# Patient Record
Sex: Female | Born: 1965 | Race: Black or African American | Hispanic: No | Marital: Married | Smoking: Current every day smoker
Health system: Southern US, Community
[De-identification: ages and names within clinical notes are randomized; demographics above are authoritative.]

## PROBLEM LIST (undated history)

## (undated) DIAGNOSIS — I10 Essential (primary) hypertension: Secondary | ICD-10-CM

## (undated) DIAGNOSIS — E785 Hyperlipidemia, unspecified: Secondary | ICD-10-CM

## (undated) DIAGNOSIS — I428 Other cardiomyopathies: Secondary | ICD-10-CM

## (undated) DIAGNOSIS — I5042 Chronic combined systolic (congestive) and diastolic (congestive) heart failure: Secondary | ICD-10-CM

## (undated) DIAGNOSIS — I493 Ventricular premature depolarization: Secondary | ICD-10-CM

## (undated) HISTORY — DX: Ventricular premature depolarization: I49.3

## (undated) HISTORY — DX: Essential (primary) hypertension: I10

## (undated) HISTORY — PX: CARDIAC CATHETERIZATION: SHX172

## (undated) HISTORY — DX: Other cardiomyopathies: I42.8

## (undated) HISTORY — DX: Chronic combined systolic (congestive) and diastolic (congestive) heart failure: I50.42

---

## 2009-09-23 ENCOUNTER — Emergency Department (HOSPITAL_COMMUNITY): Admission: EM | Admit: 2009-09-23 | Discharge: 2009-09-23 | Payer: Self-pay | Admitting: Emergency Medicine

## 2011-02-04 LAB — BASIC METABOLIC PANEL
Chloride: 104 mEq/L (ref 96–112)
GFR calc Af Amer: >60 mL/min (ref >60)
GFR calc non Af Amer: >60 mL/min (ref >60)
Potassium: 3.5 mEq/L (ref 3.5–5.1)
Sodium: 135 mEq/L (ref 135–145)

## 2011-08-31 ENCOUNTER — Emergency Department (HOSPITAL_COMMUNITY)
Admission: EM | Admit: 2011-08-31 | Discharge: 2011-08-31 | Disposition: A | Discharge status: Home / Self Care | Payer: Managed Care, Other (non HMO) | Attending: Emergency Medicine | Admitting: Emergency Medicine

## 2011-08-31 ENCOUNTER — Encounter: Payer: Self-pay | Admitting: *Deleted

## 2011-08-31 ENCOUNTER — Emergency Department (HOSPITAL_COMMUNITY): Payer: Managed Care, Other (non HMO)

## 2011-08-31 DIAGNOSIS — N83201 Unspecified ovarian cyst, right side: Secondary | ICD-10-CM | POA: Diagnosis present

## 2011-08-31 DIAGNOSIS — D259 Leiomyoma of uterus, unspecified: Secondary | ICD-10-CM | POA: Insufficient documentation

## 2011-08-31 DIAGNOSIS — D649 Anemia, unspecified: Secondary | ICD-10-CM | POA: Diagnosis present

## 2011-08-31 DIAGNOSIS — R1031 Right lower quadrant pain: Secondary | ICD-10-CM | POA: Insufficient documentation

## 2011-08-31 DIAGNOSIS — N949 Unspecified condition associated with female genital organs and menstrual cycle: Secondary | ICD-10-CM | POA: Insufficient documentation

## 2011-08-31 DIAGNOSIS — R10811 Right upper quadrant abdominal tenderness: Secondary | ICD-10-CM | POA: Insufficient documentation

## 2011-08-31 DIAGNOSIS — N83209 Unspecified ovarian cyst, unspecified side: Secondary | ICD-10-CM | POA: Insufficient documentation

## 2011-08-31 LAB — CBC
Hemoglobin: 6.8 g/dL — CL (ref 12.0–15.0)
MCH: 15.4 pg — ABNORMAL LOW (ref 26.0–34.0)
Platelets: 359 10*3/uL (ref 150–400)
RBC: 4.42 MIL/uL (ref 3.87–5.11)

## 2011-08-31 LAB — COMPREHENSIVE METABOLIC PANEL
ALT: 5 U/L (ref 0–35)
AST: 12 U/L (ref 0–37)
Alkaline Phosphatase: 55 U/L (ref 39–117)
CO2: 22 mEq/L (ref 19–32)
Calcium: 9.3 mg/dL (ref 8.4–10.5)
GFR calc non Af Amer: >90 mL/min (ref >90)
Potassium: 3.5 mEq/L (ref 3.5–5.1)
Sodium: 134 mEq/L — ABNORMAL LOW (ref 135–145)
Total Protein: 7.2 g/dL (ref 6.0–8.3)

## 2011-08-31 LAB — WET PREP, GENITAL
WBC, Wet Prep HPF POC: NONE SEEN
Yeast Wet Prep HPF POC: NONE SEEN

## 2011-08-31 LAB — URINALYSIS, ROUTINE W REFLEX MICROSCOPIC
Glucose, UA: NEGATIVE mg/dL
Ketones, ur: NEGATIVE mg/dL
Leukocytes, UA: NEGATIVE
Nitrite: NEGATIVE
Specific Gravity, Urine: 1.015 (ref 1.005–1.030)
pH: 8 (ref 5.0–8.0)

## 2011-08-31 LAB — IRON AND TIBC
Iron: <10 ug/dL — ABNORMAL LOW (ref 42–135)
UIBC: >450 ug/dL — ABNORMAL HIGH (ref 125–400)

## 2011-08-31 LAB — PREGNANCY, URINE: Preg Test, Ur: NEGATIVE

## 2011-08-31 LAB — PREPARE RBC (CROSSMATCH)

## 2011-08-31 LAB — ABO/RH: ABO/RH(D): A POS

## 2011-08-31 MED ORDER — DIPHENHYDRAMINE HCL 25 MG PO CAPS
ORAL_CAPSULE | ORAL | Status: AC
  Administered 2011-08-31: 25 mg
  Filled 2011-08-31: qty 1

## 2011-08-31 MED ORDER — IBUPROFEN 600 MG PO TABS: 600.0000 mg | ORAL_TABLET | Freq: Four times a day (QID) | ORAL | Status: AC | PRN

## 2011-08-31 MED ORDER — SODIUM CHLORIDE 0.9 % IV BOLUS (SEPSIS)
1000.0000 mL | Freq: Once | INTRAVENOUS | Status: AC
  Administered 2011-08-31: 1000 mL via INTRAVENOUS

## 2011-08-31 MED ORDER — HYDROMORPHONE HCL 2 MG PO TABS: 2.0000 mg | ORAL_TABLET | ORAL | Status: AC | PRN

## 2011-08-31 MED ORDER — MORPHINE SULFATE 4 MG/ML IJ SOLN
4.0000 mg | Freq: Once | INTRAMUSCULAR | Status: AC
  Administered 2011-08-31: 4 mg via INTRAVENOUS
  Filled 2011-08-31: qty 1

## 2011-08-31 MED ORDER — HYDROMORPHONE HCL 1 MG/ML IJ SOLN
1.0000 mg | Freq: Once | INTRAMUSCULAR | Status: AC
  Administered 2011-08-31: 1 mg via INTRAVENOUS
  Filled 2011-08-31: qty 1

## 2011-08-31 MED ORDER — ONDANSETRON HCL 4 MG/2ML IJ SOLN
4.0000 mg | Freq: Once | INTRAMUSCULAR | Status: AC
Start: 2011-08-31 — End: 2011-08-31
  Administered 2011-08-31: 4 mg via INTRAVENOUS
  Filled 2011-08-31: qty 2

## 2011-08-31 MED ORDER — METRONIDAZOLE 500 MG PO TABS
2000.0000 mg | ORAL_TABLET | Freq: Once | ORAL | Status: AC
  Administered 2011-08-31: 2000 mg via ORAL
  Filled 2011-08-31: qty 4

## 2011-08-31 MED ORDER — MORPHINE SULFATE 2 MG/ML IJ SOLN
2.0000 mg | Freq: Once | INTRAMUSCULAR | Status: AC
  Administered 2011-08-31: 2 mg via INTRAVENOUS
  Filled 2011-08-31: qty 1

## 2011-08-31 NOTE — ED Notes (Signed)
CRITICAL VALUE ALERT  Critical value received: hgb 6.8  Date of notification:  08/31/2011  Time of notification:  1320  Critical value read back: yes  Nurse who received alert:  Neldon Mc RN  MD notified (1st page): 1324 Dr. Karma Ganja  Time of first page:1324  MD notified (2nd page):  Time of second page:  Responding MD:  Dr. Karma Ganja  Time MD responded: 1324

## 2011-08-31 NOTE — ED Provider Notes (Signed)
Reason for Consult:rlq pain x 24 hrs  Referring Physician: Karma Ganja MD  Lauren Banks is an 45 y.o. female seen in the ED [facility] for rlq pain, with workup by Dr linker.  Pt c longstanding uterine fibroids, not being currently evaluated or followed. Pt works daily at job.  Pt has severe pain which she feels she can  Manage with PO meds, she desires to go home.   Pertinent Gynecological History: Menses: regular every 30 days without intermenstrual spotting Bleeding: x 5 days/ cycyle Contraception:  DES exposure: denies Blood transfusions: none Sexually transmitted diseases: no past history Previous GYN Procedures:   Last mammogram:  Date:  Last pap:  Date:  OB History: G, P   Menstrual History: Menarche age: unknown Patient's last menstrual period was 08/23/2011.    History reviewed. No pertinent past medical history.  History reviewed. No pertinent past surgical history.  History reviewed. No pertinent family history.  Social History:  reports that she has been smoking.  She does not have any smokeless tobacco history on file. She reports that she does not drink alcohol or use illicit drugs.  Allergies: No Known Allergies  Medications: I have reviewed the patient's current medications.  ROS  Pt describes self as hungry, wants to go home, aware that pain may be significant x several days.  Blood pressure 156/79, pulse 76, temperature 97.9 F (36.6 C), temperature source Oral, resp. rate 16, height 5\' 10"  (1.778 m), weight 102.059 kg (225 lb), last menstrual period 08/23/2011, SpO2 100.00%. Physical ExamPhysical Examination: General appearance - oriented to person, place, and time, normal appearing weight, well hydrated and in mild to moderate distress Mental status - alert, oriented to person, place, and time, clearly in capacity of mental facilities Abdomen - tenderness noted in the rlq especially when the enlarges 24wk sized uterus is moved side to side.  Uterus nontender to  direct palpation, no rebound.  BS present.  bowel sounds normal  Extremities - Homan's sign negative bilaterally Skin - normal coloration and turgor, no rashes, no suspicious skin lesions noted  Results for orders placed during the hospital encounter of 08/31/11 (from the past 48 hour(s))  URINALYSIS, ROUTINE W REFLEX MICROSCOPIC     Status: Normal   Collection Time   08/31/11 11:24 AM      Component Value Range Comment   Color, Urine YELLOW  YELLOW     Appearance CLEAR  CLEAR     Specific Gravity, Urine 1.015  1.005 - 1.030     pH 8.0  5.0 - 8.0     Glucose, UA NEGATIVE  NEGATIVE (mg/dL)    Hgb urine dipstick NEGATIVE  NEGATIVE     Bilirubin Urine NEGATIVE  NEGATIVE     Ketones, ur NEGATIVE  NEGATIVE (mg/dL)    Protein, ur NEGATIVE  NEGATIVE (mg/dL)    Urobilinogen, UA 0.2  0.0 - 1.0 (mg/dL)    Nitrite NEGATIVE  NEGATIVE     Leukocytes, UA NEGATIVE  NEGATIVE  MICROSCOPIC NOT DONE ON URINES WITH NEGATIVE PROTEIN, BLOOD, LEUKOCYTES, NITRITE, OR GLUCOSE <1000 mg/dL.  PREGNANCY, URINE     Status: Normal   Collection Time   08/31/11 11:24 AM      Component Value Range Comment   Preg Test, Ur NEGATIVE     CBC     Status: Abnormal   Collection Time   08/31/11 12:14 PM      Component Value Range Comment   WBC 11.2 (*) 4.0 - 10.5 (K/uL)  RBC 4.42  3.87 - 5.11 (MIL/uL)    Hemoglobin 6.8 (*) 12.0 - 15.0 (g/dL)    HCT 16.1 (*) 09.6 - 46.0 (%)    MCV 58.4 (*) 78.0 - 100.0 (fL)    MCH 15.4 (*) 26.0 - 34.0 (pg)    MCHC 26.4 (*) 30.0 - 36.0 (g/dL)    RDW 04.5 (*) 40.9 - 15.5 (%)    Platelets 359  150 - 400 (K/uL)   COMPREHENSIVE METABOLIC PANEL     Status: Abnormal   Collection Time   08/31/11 12:14 PM      Component Value Range Comment   Sodium 134 (*) 135 - 145 (mEq/L)    Potassium 3.5  3.5 - 5.1 (mEq/L)    Chloride 101  96 - 112 (mEq/L)    CO2 22  19 - 32 (mEq/L)    Glucose, Bld 91  70 - 99 (mg/dL)    BUN 7  6 - 23 (mg/dL)    Creatinine, Ser 8.11  0.50 - 1.10 (mg/dL)     Calcium 9.3  8.4 - 10.5 (mg/dL)    Total Protein 7.2  6.0 - 8.3 (g/dL)    Albumin 3.7  3.5 - 5.2 (g/dL)    AST 12  0 - 37 (U/L)    ALT 5  0 - 35 (U/L)    Alkaline Phosphatase 55  39 - 117 (U/L)    Total Bilirubin 0.3  0.3 - 1.2 (mg/dL)    GFR calc non Af Amer >90  >90 (mL/min)    GFR calc Af Amer >90  >90 (mL/min)   LIPASE, BLOOD     Status: Normal   Collection Time   08/31/11 12:14 PM      Component Value Range Comment   Lipase 19  11 - 59 (U/L)   PREPARE RBC (CROSSMATCH)     Status: Normal   Collection Time   08/31/11  1:50 PM      Component Value Range Comment   Order Confirmation ORDER PROCESSED BY BLOOD BANK     RETICULOCYTES     Status: Normal   Collection Time   08/31/11  1:50 PM      Component Value Range Comment   Retic Ct Pct 1.7  0.4 - 3.1 (%)    RBC. 4.47  3.87 - 5.11 (MIL/uL)    Retic Count, Manual 76.0  19.0 - 186.0 (K/uL)   TYPE AND SCREEN     Status: Normal (Preliminary result)   Collection Time   08/31/11  1:50 PM      Component Value Range Comment   ABO/RH(D) A POS      Antibody Screen NEG      Sample Expiration 09/03/2011      Unit Number [id number]      Blood Component Type RED CELLS,LR      Unit division 00      Status of Unit ALLOCATED      Transfusion Status OK TO TRANSFUSE      Crossmatch Result Compatible     ABO/RH     Status: Normal   Collection Time   08/31/11  1:50 PM      Component Value Range Comment   ABO/RH(D) A POS     WET PREP, GENITAL     Status: Abnormal   Collection Time   08/31/11  3:33 PM      Component Value Range Comment   Yeast, Wet Prep NONE SEEN  NONE SEEN  Trich, Wet Prep NONE SEEN  NONE SEEN     Clue Cells, Wet Prep MANY (*) NONE SEEN     WBC, Wet Prep HPF POC NONE SEEN  NONE SEEN      US Transvaginal Non-ob  08/31/2011  *RADIOLOGY REPORT*  Clinical Data: Right pelvic pain, question uterine mass on exam  TRANSABDOMINAL AND TRANSVAGINAL ULTRASOUND OF PELVIS Technique:  Both transabdominal and transvaginal  ultrasound examinations of the pelvis were performed. Transabdominal technique was performed for global imaging of the pelvis including uterus, ovaries, adnexal regions, and pelvic cul-de-sac. Transvaginal imaging limited by uterine size.  Comparison: None   It was necessary to proceed with endovaginal exam following the transabdominal exam to visualize the endometrial complex.  Findings:  Uterus: 18.4 cm length by 13.3 cm AP by 15.6 cm transverse.  Two uterine leiomyomata identified.  Largest is at the mid uterine segment, 8.4 x 9.4 x 11.1 cm in size, posteriorly, extending from subserosal to submucosal and distorting the endometrial complex.  A second small leiomyoma is seen at the anterior aspect of the upper uterine segment, 4.6 x 4.2 x 4.6 cm.  Endometrium: Inadequately visualized to measure thickness on both transabdominal and endovaginal imaging.  No gross evidence of endometrial fluid.  Right ovary:  Seen only on transabdominal imaging.  Measures 6.7 x 4.4 x 4.7 cm.  A mildly complex follicle cyst is identified 3.9 x 4.8 x 2.9 cm, question resolving hemorrhagic cyst.  A second cystic area is identified 2.7 x 3.3 x 2.5 cm, simple in character.  Left ovary: Seen only on the transabdominal imaging.  Measures 5.6 x 1.7 x 3.0 cm.  No masses identified.  Other findings: No free fluid  IMPRESSION: Enlarged uterus containing two leiomyomata, largest measuring 11.1 cm in size. Two cysts within right ovary, largest 4.8 cm, question hemorrhagic cyst.  Original Report Authenticated By: Lollie Marrow, M.D.   US Pelvis Complete  08/31/2011  *RADIOLOGY REPORT*  Clinical Data: Right pelvic pain, question uterine mass on exam  TRANSABDOMINAL AND TRANSVAGINAL ULTRASOUND OF PELVIS Technique:  Both transabdominal and transvaginal ultrasound examinations of the pelvis were performed. Transabdominal technique was performed for global imaging of the pelvis including uterus, ovaries, adnexal regions, and pelvic cul-de-sac.  Transvaginal imaging limited by uterine size.  Comparison: None   It was necessary to proceed with endovaginal exam following the transabdominal exam to visualize the endometrial complex.  Findings:  Uterus: 18.4 cm length by 13.3 cm AP by 15.6 cm transverse.  Two uterine leiomyomata identified.  Largest is at the mid uterine segment, 8.4 x 9.4 x 11.1 cm in size, posteriorly, extending from subserosal to submucosal and distorting the endometrial complex.  A second small leiomyoma is seen at the anterior aspect of the upper uterine segment, 4.6 x 4.2 x 4.6 cm.  Endometrium: Inadequately visualized to measure thickness on both transabdominal and endovaginal imaging.  No gross evidence of endometrial fluid.  Right ovary:  Seen only on transabdominal imaging.  Measures 6.7 x 4.4 x 4.7 cm.  A mildly complex follicle cyst is identified 3.9 x 4.8 x 2.9 cm, question resolving hemorrhagic cyst.  A second cystic area is identified 2.7 x 3.3 x 2.5 cm, simple in character.  Left ovary: Seen only on the transabdominal imaging.  Measures 5.6 x 1.7 x 3.0 cm.  No masses identified.  Other findings: No free fluid  IMPRESSION: Enlarged uterus containing two leiomyomata, largest measuring 11.1 cm in size. Two cysts within right ovary, largest 4.8  cm, question hemorrhagic cyst.  Original Report Authenticated By: Lollie Marrow, M.D.    Assessment/Plan: Uterine fibroids, pressing on a simple hemorrhagic cyst on Rt ovary, with no evidence of intraabdominal bleeding. Will attempt outpatient Pain management with followup later this week. At [facility]. Friday 9:00.  Tilda Burrow 08/31/2011

## 2011-08-31 NOTE — ED Notes (Signed)
Pt c/o right lower quadrant abdominal pain, nausea, vomiting and diarrhea since last night. States that she has been able to keep fluids down. Denies urinary symptoms or vaginal discharge.

## 2011-08-31 NOTE — Discharge Summary (Signed)
  Patient eval noted in her consult note.  Will treat with po dilaudid and motrin, and follow up in 4 days , or earlier prn [facility]

## 2011-08-31 NOTE — ED Provider Notes (Signed)
History   This chart was scribed for Lauren Chick, MD by Clarita Crane. The patient was seen in room [facility] and the patient's care was started at 11:22AM.   CSN: [id number] Arrival date & time: 08/31/2011  9:59 AM   First MD Initiated Contact with Patient 08/31/11 1110      Chief Complaint  Patient presents with  . Abdominal Pain   HPI Lauren Banks is a 45 y.o. female who presents to the Emergency Department complaining of constant moderate lower abdominal pain with gradual onset last night and worsening since with associated vomiting, fever and increased frequency of BMs. Patient describes abdominal pain as pressure which will radiate to her right lower back when in certain positions. She states the pain is aggravated by movement and relieved by lying on her left side. Denies diarrhea, increased vaginal bleeding during menstruation.  History reviewed. No pertinent past medical history.  History reviewed. No pertinent past surgical history.  History reviewed. No pertinent family history.  History  Substance Use Topics  . Smoking status: Current Everyday Smoker -- 1.0 packs/day  . Smokeless tobacco: Not on file  . Alcohol Use: No    OB History    Grav Para Term Preterm Abortions TAB SAB Ect Mult Living                  Review of Systems 10 Systems reviewed and are negative for acute change except as noted in the HPI.  Allergies  Review of patient's allergies indicates no known allergies.  Home Medications   Current Outpatient Rx  Name Route Sig Dispense Refill  . ASPIRIN-SALICYLAMIDE-CAFFEINE [id number] MG PO PACK Oral Take 1 packet by mouth daily as needed. pain     . DIPHENHYDRAMINE HCL 25 MG PO CAPS Oral Take 25 mg by mouth every 6 (six) hours as needed. allergies     . RANITIDINE HCL 150 MG PO TABS Oral Take 150 mg by mouth daily as needed. Acid reflex       BP 150/91  Pulse 79  Temp(Src) 97.9 F (36.6 C) (Oral)  Resp 20  Ht 5\' 10"  (1.778 m)  Wt  225 lb (102.059 kg)  BMI 32.28 kg/m2  SpO2 100%  LMP 08/23/2011  Physical Exam  Nursing note and vitals reviewed. Constitutional: She is oriented to person, place, and time. She appears well-developed and well-nourished. No distress.  HENT:  Head: Normocephalic and atraumatic.  Eyes: EOM are normal. Pupils are equal, round, and reactive to light.  Neck: Neck supple. No tracheal deviation present.  Cardiovascular: Normal rate.   Pulmonary/Chest: Effort normal. No respiratory distress.  Abdominal: Soft. She exhibits no distension (10cm mass palpated to lower abdomen. ). There is tenderness in the right lower quadrant.       Fullness noted to LLQ.  Genitourinary:       Chaperone present for exam. Whitish-yellow vaginal d/c noted. Cervix normal. Right adnexal tenderness. Globular, firm tender uterine mass noted.   Musculoskeletal: Normal range of motion. She exhibits no edema.  Neurological: She is alert and oriented to person, place, and time. No sensory deficit.  Skin: Skin is warm and dry.  Psychiatric: She has a normal mood and affect. Her behavior is normal.    ED Course  Procedures (including critical care time)  DIAGNOSTIC STUDIES: Oxygen Saturation is 100% on room air, normal by my interpretation.    COORDINATION OF CARE: 3:17PM- Lauren Chick, MD to bedside to perform pelvic exam.  Labs Reviewed  CBC - Abnormal; Notable for the following:    WBC 11.2 (*)    Hemoglobin 6.8 (*)    HCT 25.8 (*)    MCV 58.4 (*)    MCH 15.4 (*)    MCHC 26.4 (*)    RDW 22.6 (*)    All other components within normal limits  COMPREHENSIVE METABOLIC PANEL - Abnormal; Notable for the following:    Sodium 134 (*)    All other components within normal limits  URINALYSIS, ROUTINE W REFLEX MICROSCOPIC  PREGNANCY, URINE  LIPASE, BLOOD  PREPARE RBC (CROSSMATCH)  RETICULOCYTES  TYPE AND SCREEN  GC/CHLAMYDIA PROBE AMP, GENITAL  WET PREP, GENITAL  VITAMIN B12  FOLATE  IRON AND TIBC    FERRITIN   US Transvaginal Non-ob  08/31/2011  *RADIOLOGY REPORT*  Clinical Data: Right pelvic pain, question uterine mass on exam  TRANSABDOMINAL AND TRANSVAGINAL ULTRASOUND OF PELVIS Technique:  Both transabdominal and transvaginal ultrasound examinations of the pelvis were performed. Transabdominal technique was performed for global imaging of the pelvis including uterus, ovaries, adnexal regions, and pelvic cul-de-sac. Transvaginal imaging limited by uterine size.  Comparison: None   It was necessary to proceed with endovaginal exam following the transabdominal exam to visualize the endometrial complex.  Findings:  Uterus: 18.4 cm length by 13.3 cm AP by 15.6 cm transverse.  Two uterine leiomyomata identified.  Largest is at the mid uterine segment, 8.4 x 9.4 x 11.1 cm in size, posteriorly, extending from subserosal to submucosal and distorting the endometrial complex.  A second small leiomyoma is seen at the anterior aspect of the upper uterine segment, 4.6 x 4.2 x 4.6 cm.  Endometrium: Inadequately visualized to measure thickness on both transabdominal and endovaginal imaging.  No gross evidence of endometrial fluid.  Right ovary:  Seen only on transabdominal imaging.  Measures 6.7 x 4.4 x 4.7 cm.  A mildly complex follicle cyst is identified 3.9 x 4.8 x 2.9 cm, question resolving hemorrhagic cyst.  A second cystic area is identified 2.7 x 3.3 x 2.5 cm, simple in character.  Left ovary: Seen only on the transabdominal imaging.  Measures 5.6 x 1.7 x 3.0 cm.  No masses identified.  Other findings: No free fluid  IMPRESSION: Enlarged uterus containing two leiomyomata, largest measuring 11.1 cm in size. Two cysts within right ovary, largest 4.8 cm, question hemorrhagic cyst.  Original Report Authenticated By: Lollie Marrow, M.D.   US Pelvis Complete  08/31/2011  *RADIOLOGY REPORT*  Clinical Data: Right pelvic pain, question uterine mass on exam  TRANSABDOMINAL AND TRANSVAGINAL ULTRASOUND OF PELVIS  Technique:  Both transabdominal and transvaginal ultrasound examinations of the pelvis were performed. Transabdominal technique was performed for global imaging of the pelvis including uterus, ovaries, adnexal regions, and pelvic cul-de-sac. Transvaginal imaging limited by uterine size.  Comparison: None   It was necessary to proceed with endovaginal exam following the transabdominal exam to visualize the endometrial complex.  Findings:  Uterus: 18.4 cm length by 13.3 cm AP by 15.6 cm transverse.  Two uterine leiomyomata identified.  Largest is at the mid uterine segment, 8.4 x 9.4 x 11.1 cm in size, posteriorly, extending from subserosal to submucosal and distorting the endometrial complex.  A second small leiomyoma is seen at the anterior aspect of the upper uterine segment, 4.6 x 4.2 x 4.6 cm.  Endometrium: Inadequately visualized to measure thickness on both transabdominal and endovaginal imaging.  No gross evidence of endometrial fluid.  Right ovary:  Seen only  on transabdominal imaging.  Measures 6.7 x 4.4 x 4.7 cm.  A mildly complex follicle cyst is identified 3.9 x 4.8 x 2.9 cm, question resolving hemorrhagic cyst.  A second cystic area is identified 2.7 x 3.3 x 2.5 cm, simple in character.  Left ovary: Seen only on the transabdominal imaging.  Measures 5.6 x 1.7 x 3.0 cm.  No masses identified.  Other findings: No free fluid  IMPRESSION: Enlarged uterus containing two leiomyomata, largest measuring 11.1 cm in size. Two cysts within right ovary, largest 4.8 cm, question hemorrhagic cyst.  Original Report Authenticated By: Lollie Marrow, M.D.     No diagnosis found.    MDM  Pt with right pelvic pain beginning last night.  On abdominal and pelvic exams there is large firm uterine mass, uterus is tender and right adnexal tenderness.  Pelvic ultrasound reveals 2 leiomyomata- largest 11.1cm.  hgb is 6.8- type and screen ordered.  Pt states she has been anemic in the past- but hgb runs approx 9.  Denies  heavy or prolonged menstrual bleeding- approx 5 day cycle without clots.  Pt has received multiple doses of narcotics and continues to c/o significant pain.  D/w Dr. Emelda Fear, gyn on call- he will come to ED to evaluate patient.     5:20pm- Dr. Emelda Fear has seen patient in the ED, he is going to discharge patient and will follow up with her in 4 days for a recheck.  He is giving a rx for dilaudid and will start on iron supplements.  She will need surgical intervention at a later date.  Pt is agreeable with this plan and was offered admssion for pain control and further management but prefers outpatient plan.     I personally performed the services described in this documentation, which was scribed in my presence. The recorded information has been reviewed and considered.    Lauren Chick, MD 09/01/11 [phone]

## 2011-09-01 LAB — TYPE AND SCREEN: ABO/RH(D): A POS

## 2011-09-01 LAB — FERRITIN: Ferritin: 2 ng/mL — ABNORMAL LOW (ref 10–291)

## 2011-09-01 LAB — GC/CHLAMYDIA PROBE AMP, GENITAL: Chlamydia, DNA Probe: NEGATIVE

## 2011-09-09 ENCOUNTER — Other Ambulatory Visit: Payer: Self-pay | Admitting: Obstetrics and Gynecology

## 2011-09-09 ENCOUNTER — Other Ambulatory Visit (HOSPITAL_COMMUNITY)
Admission: RE | Admit: 2011-09-09 | Discharge: 2011-09-09 | Disposition: A | Discharge status: Home / Self Care | Payer: Managed Care, Other (non HMO) | Source: Ambulatory Visit | Attending: Obstetrics and Gynecology | Admitting: Obstetrics and Gynecology

## 2011-09-09 DIAGNOSIS — Z01419 Encounter for gynecological examination (general) (routine) without abnormal findings: Secondary | ICD-10-CM | POA: Insufficient documentation

## 2011-09-29 ENCOUNTER — Other Ambulatory Visit: Payer: Self-pay | Admitting: Obstetrics and Gynecology

## 2011-10-07 ENCOUNTER — Encounter (HOSPITAL_COMMUNITY): Payer: Self-pay | Admitting: Pharmacy Technician

## 2011-10-07 ENCOUNTER — Encounter (HOSPITAL_COMMUNITY)
Admission: RE | Admit: 2011-10-07 | Discharge: 2011-10-07 | Disposition: A | Discharge status: Home / Self Care | Payer: Managed Care, Other (non HMO) | Source: Ambulatory Visit | Attending: Obstetrics and Gynecology | Admitting: Obstetrics and Gynecology

## 2011-10-07 ENCOUNTER — Other Ambulatory Visit: Payer: Self-pay | Admitting: Obstetrics and Gynecology

## 2011-10-07 ENCOUNTER — Encounter: Payer: Self-pay | Admitting: Obstetrics and Gynecology

## 2011-10-07 ENCOUNTER — Encounter (HOSPITAL_COMMUNITY): Payer: Self-pay

## 2011-10-07 LAB — CBC
Hemoglobin: 8.5 g/dL — ABNORMAL LOW (ref 12.0–15.0)
MCHC: 28 g/dL — ABNORMAL LOW (ref 30.0–36.0)
Platelets: 445 10*3/uL — ABNORMAL HIGH (ref 150–400)
RDW: 27.5 % — ABNORMAL HIGH (ref 11.5–15.5)

## 2011-10-07 LAB — COMPREHENSIVE METABOLIC PANEL
ALT: 8 U/L (ref 0–35)
AST: 18 U/L (ref 0–37)
Albumin: 3.6 g/dL (ref 3.5–5.2)
Alkaline Phosphatase: 59 U/L (ref 39–117)
Potassium: 3.9 mEq/L (ref 3.5–5.1)
Sodium: 138 mEq/L (ref 135–145)
Total Protein: 7.1 g/dL (ref 6.0–8.3)

## 2011-10-07 LAB — SURGICAL PCR SCREEN: Staphylococcus aureus: NEGATIVE

## 2011-10-07 MED ORDER — PEG 3350-KCL-NABCB-NACL-NASULF 236 G PO SOLR: 4000.0000 mL | Freq: Once | ORAL | Status: DC

## 2011-10-07 NOTE — Pre-Procedure Instructions (Signed)
Pt's pre-op lab work showed Hgb level of 8.5 and Hmt level of 30.4, pt had type and screen done this morning. Dr. Jayme Cloud made aware and said she might need a crossmatch and to notify Dr. Emelda Fear. I called Dr. Rayna Sexton office and he was in with a pt so his nurse took a message about the lab work and she said she will notify him and if he wants to add on a crossmatch or give additional orders she will get him to call us back.

## 2011-10-07 NOTE — Patient Instructions (Addendum)
Lauren Banks  10/07/2011   Your procedure is scheduled on:  10/13/2011  Report to [facility] at 06:15 AM.  Call this number if you have problems the morning of surgery: [phone]   Remember:   Do not eat food:After Midnight.  May have clear liquids:until Midnight .  Clear liquids include soda, tea, black coffee, apple or grape juice, broth.  Take these medicines the morning of surgery with A SIP OF WATER: hydrocodone/acetaminophen 10/325mg    Do not wear jewelry, make-up or nail polish.  Do not wear lotions, powders, or perfumes. You may wear deodorant.  Do not shave 48 hours prior to surgery.  Do not bring valuables to the hospital.  Contacts, dentures or bridgework may not be worn into surgery.  Leave suitcase in the car. After surgery it may be brought to your room.  For patients admitted to the hospital, checkout time is 11:00 AM the day of discharge.   Patients discharged the day of surgery will not be allowed to drive home.  Name and phone number of your driver: husband  Special Instructions: CHG Shower Use Special Wash: 1/2 bottle night before surgery and 1/2 bottle morning of surgery.   Please read over the following fact sheets that you were given: Pain Booklet, MRSA Information, Surgical Site Infection Prevention, Anesthesia Post-op Instructions and Care and Recovery After Surgery     Hysterectomy A hysterectomy is a procedure where your womb (uterus) is surgically taken out. It will no longer be possible to have menstrual periods or to become pregnant. Removal of the tubes and ovaries (bilateral salpingo-oopherectomy) can be done during this operation as well.   An abdominal hysterectomy is done through a large cut (incision) in the abdomen made by the surgeon.   A vaginal hysterectomy is done through the vagina. There are no abdominal incisions, but there will be incisions on the inside of the vagina.   A laparoscopic assisted vaginal hysterectomy is done through 2  or 3 small incisions in the abdomen, but the uterus is removed and passed through the vagina.  Women who are going to have a hysterectomy should be tested first to make sure there is no cancer of the cervix or in the uterus. INDICATIONS FOR HYSTERECTOMY:  Persistent abnormal bleeding.   Lasting (chronic) pelvic pain.   Endometriosis. This is when the lining of the uterus (endometrium) is misplaced outside of its normal location.   Adenomyosis. This is when the endometrium tissue grows in the muscle of the uterus.   Uterine prolapse. This is when the uterus falls down into the vagina.   Cancer of the uterus or cervix that requires a radical hysterectomy, removal of the uterus, tubes, ovaries, and surrounding lymph nodes.  LET YOUR CAREGIVER KNOW ABOUT:  Allergies (especially to medicines).   Medications taken including herbs, eye drops, over the counter medications, and creams.   Use of steroids (by mouth or creams).   Past problems with anesthetics or numbing medication.   Possibility of pregnancy, if this applies.   History of blood clots (thrombophlebitis).   History of bleeding or blood problems.   Past surgery.   Other health problems.  RISKS AND COMPLICATIONS All surgeries can have risks. Some of these risks are:  A lot of bleeding.   Injury to surrounding organs.   Infection.   Blood clots of the leg, heart, or lung.   Problems with anesthesia.   Early menopause.  BEFORE THE PROCEDURE  Do not take aspirin  or blood thinners for a week before surgery, or as directed by your caregiver.   Do not eat or drink anything after midnight the night before surgery, or as directed by your caregiver.   Let your caregiver know if you get a cold or other infectious problems before surgery.   If you are being admitted the day of surgery, you should be present 60 minutes before your procedure or as told by your caregiver.  PROCEDURE   An IV (intravenous) will be placed  in your arm. You will be given a drug to make you sleep (anesthetic) during surgery. You may be given a shot in the spine (spinal anesthesia) that will numb your body from the waist down. This will keep you pain-free during surgery.   When you wake from surgery, you will have the IV and a long, narrow, hollow tube (urinary catheter) draining the bladder for 1 or 2 days after surgery. This will make passing your urine easier. It also helps by keeping your bladder empty during surgery.   After surgery, you will be taken to the recovery area where a nurse will watch and check your progress. Once you wake up, stable and taking fluids well, without other problems, you will be allowed to return to your room. Usually you will remain in the hospital 3 to 5 days. You may be given an antibiotic during and after the surgery and when you go home. Pain medication will be ordered by your caregiver while you are in the hospital and when you go home.  HOME CARE INSTRUCTIONS  Healing will take time. You will have discomfort, tenderness, swelling, and bruising at the operative site for a couple of weeks. This is normal and will get better as time goes on.   Only take over-the-counter or prescription medicines for pain, discomfort, or fever as directed by your caregiver.   Do not take aspirin. It can cause bleeding.   Do not drive when taking pain medication.   Follow your caregiver's advice regarding diet, exercise, lifting, driving, and general activities.   Resume your usual diet as directed and allowed.   Get plenty of rest and sleep.   Do not douche, use tampons, or have sexual intercourse until your caregiver gives you permission.   Change your bandages (dressings) as directed.   Take your temperature twice a day. Write it down.   Your caregiver may recommend showers instead of baths for a few weeks.   Do not drink alcohol until your caregiver gives you permission.   If you develop constipation, you  may take a mild laxative with your caregiver's permission. Bran foods and drinking fluids helps with constipation problems.   Try to have someone home with you for a week or two to help with the household activities.   Make sure you and your family understands everything about your operation and recovery.   Do not sign any legal documents until you feel normal again.   Keep all your follow-up appointments as recommended by your caregiver.  SEEK MEDICAL CARE IF:   There is swelling, redness, or increasing pain in the wound area.   Pus is coming from the wound.   You notice a bad smell from the wound or surgical dressing.   You have pain, redness, and swelling from the intravenous site.   The wound is breaking open (the edges are not staying together).   You feel dizzy or feel like fainting.   You develop pain or bleeding  when you urinate.   You develop diarrhea.   You develop nausea and vomiting.   You develop abnormal vaginal discharge.   You develop a rash.   You have any type of abnormal reaction or develop an allergy to your medication.   You need stronger pain medication for your pain.  SEEK IMMEDIATE MEDICAL CARE IF:  You have a fever.   You develop abdominal pain.   You develop chest pain.   You develop shortness of breath.   You pass out.   You develop pain, swelling, or redness of your leg.   You develop heavy vaginal bleeding with or without blood clots.  Document Released: 04/14/2001 Document Revised: 07/01/2011 Document Reviewed: 03/01/2008 [facility] Patient Information 2012 [id number], [place].

## 2011-10-08 NOTE — Pre-Procedure Instructions (Signed)
Talked with Dr. Emelda Fear this morning about whether or not he wanted to add on a crossmatch for 2 units for this pt considering her pre-op lab work showed Hgb level of 8.5 and Hmt level of 30.4. He said he will probably want to crossmatch her for 2 units, but he wants to talk to her first about whether or not she will want to come in and get a transfusion before her surgery. He said to call him back tomorrow morning and he will let me know what the plan of care is as to whether he wants me to add on a crossmatch.

## 2011-10-09 NOTE — Pre-Procedure Instructions (Signed)
Per Dr. Rayna Sexton telephone order, 2 units of RBC were put in to be crossmatched and transfused on 10/13/11 prior to surgery (abdominal hysterectomy) at 1140. I have called the pt to make her aware and am having her arrive at 0700 in order to have time for the transfusion to be completed prior to surgery.

## 2011-10-12 LAB — PREPARE RBC (CROSSMATCH)

## 2011-10-13 ENCOUNTER — Encounter (HOSPITAL_COMMUNITY): Payer: Self-pay

## 2011-10-13 ENCOUNTER — Encounter (HOSPITAL_COMMUNITY): Payer: Self-pay | Admitting: Anesthesiology

## 2011-10-13 ENCOUNTER — Encounter (HOSPITAL_COMMUNITY)
Admission: RE | Disposition: A | Discharge status: Home / Self Care | Payer: Self-pay | Source: Ambulatory Visit | Attending: Obstetrics and Gynecology

## 2011-10-13 ENCOUNTER — Encounter (HOSPITAL_COMMUNITY): Payer: Self-pay | Admitting: General Practice

## 2011-10-13 ENCOUNTER — Ambulatory Visit (HOSPITAL_COMMUNITY): Payer: Managed Care, Other (non HMO)

## 2011-10-13 ENCOUNTER — Other Ambulatory Visit: Payer: Self-pay | Admitting: Obstetrics and Gynecology

## 2011-10-13 ENCOUNTER — Inpatient Hospital Stay (HOSPITAL_COMMUNITY)
Admission: RE | Admit: 2011-10-13 | Discharge: 2011-10-15 | DRG: 743 | Disposition: A | Discharge status: Home / Self Care | Payer: Managed Care, Other (non HMO) | Source: Ambulatory Visit | Attending: Obstetrics and Gynecology | Admitting: Obstetrics and Gynecology

## 2011-10-13 DIAGNOSIS — D259 Leiomyoma of uterus, unspecified: Secondary | ICD-10-CM

## 2011-10-13 DIAGNOSIS — N840 Polyp of corpus uteri: Secondary | ICD-10-CM | POA: Diagnosis present

## 2011-10-13 DIAGNOSIS — D649 Anemia, unspecified: Secondary | ICD-10-CM | POA: Diagnosis present

## 2011-10-13 HISTORY — PX: ABDOMINAL HYSTERECTOMY: SHX81

## 2011-10-13 LAB — HEMOGLOBIN AND HEMATOCRIT, BLOOD
HCT: 35.7 % — ABNORMAL LOW (ref 36.0–46.0)
Hemoglobin: 10.2 g/dL — ABNORMAL LOW (ref 12.0–15.0)

## 2011-10-13 LAB — POCT I-STAT 4, (NA,K, GLUC, HGB,HCT)
HCT: 37 % (ref 36.0–46.0)
Hemoglobin: 12.6 g/dL (ref 12.0–15.0)

## 2011-10-13 LAB — GLUCOSE, CAPILLARY: Glucose-Capillary: 123 mg/dL — ABNORMAL HIGH (ref 70–99)

## 2011-10-13 SURGERY — HYSTERECTOMY, ABDOMINAL
Anesthesia: General | Site: Abdomen | Wound class: Clean Contaminated

## 2011-10-13 MED ORDER — ONDANSETRON HCL 4 MG/2ML IJ SOLN
4.0000 mg | Freq: Four times a day (QID) | INTRAMUSCULAR | Status: DC | PRN
  Administered 2011-10-14 (×2): 4 mg via INTRAVENOUS
  Filled 2011-10-13 (×2): qty 2

## 2011-10-13 MED ORDER — KETOROLAC TROMETHAMINE 30 MG/ML IJ SOLN
30.0000 mg | Freq: Four times a day (QID) | INTRAMUSCULAR | Status: AC
  Filled 2011-10-13: qty 1

## 2011-10-13 MED ORDER — LIDOCAINE HCL 1 % IJ SOLN
INTRAMUSCULAR | Status: DC | PRN
  Administered 2011-10-13: 35 mg via INTRADERMAL

## 2011-10-13 MED ORDER — NEOSTIGMINE METHYLSULFATE 1 MG/ML IJ SOLN
INTRAMUSCULAR | Status: AC
  Filled 2011-10-13: qty 10

## 2011-10-13 MED ORDER — PROPOFOL 10 MG/ML IV EMUL
INTRAVENOUS | Status: AC
  Filled 2011-10-13: qty 20

## 2011-10-13 MED ORDER — CEFAZOLIN SODIUM 1-5 GM-% IV SOLN
INTRAVENOUS | Status: AC
  Filled 2011-10-13: qty 100

## 2011-10-13 MED ORDER — FENTANYL CITRATE 0.05 MG/ML IJ SOLN
INTRAMUSCULAR | Status: AC
  Administered 2011-10-13: 50 ug via INTRAVENOUS
  Filled 2011-10-13: qty 2

## 2011-10-13 MED ORDER — SODIUM CHLORIDE 0.9 % IJ SOLN: 9.0000 mL | INTRAMUSCULAR | Status: DC | PRN

## 2011-10-13 MED ORDER — BIOTENE DRY MOUTH MT LIQD
15.0000 mL | Freq: Two times a day (BID) | OROMUCOSAL | Status: DC
  Administered 2011-10-14 (×2): 15 mL via OROMUCOSAL

## 2011-10-13 MED ORDER — ROCURONIUM BROMIDE 100 MG/10ML IV SOLN
INTRAVENOUS | Status: DC | PRN
  Administered 2011-10-13: 50 mg via INTRAVENOUS
  Administered 2011-10-13: 10 mg via INTRAVENOUS
  Administered 2011-10-13: 20 mg via INTRAVENOUS
  Administered 2011-10-13: 10 mg via INTRAVENOUS

## 2011-10-13 MED ORDER — FENTANYL CITRATE 0.05 MG/ML IJ SOLN
INTRAMUSCULAR | Status: AC
  Filled 2011-10-13: qty 5

## 2011-10-13 MED ORDER — SIMETHICONE 80 MG PO CHEW: 80.0000 mg | CHEWABLE_TABLET | Freq: Four times a day (QID) | ORAL | Status: DC | PRN

## 2011-10-13 MED ORDER — KETOROLAC TROMETHAMINE 30 MG/ML IJ SOLN
INTRAMUSCULAR | Status: AC
  Filled 2011-10-13: qty 1

## 2011-10-13 MED ORDER — IBUPROFEN 600 MG PO TABS: 600.0000 mg | ORAL_TABLET | Freq: Four times a day (QID) | ORAL | Status: DC | PRN

## 2011-10-13 MED ORDER — FENTANYL CITRATE 0.05 MG/ML IJ SOLN
25.0000 ug | INTRAMUSCULAR | Status: DC | PRN
  Administered 2011-10-13 (×4): 50 ug via INTRAVENOUS

## 2011-10-13 MED ORDER — MIDAZOLAM HCL 2 MG/2ML IJ SOLN
INTRAMUSCULAR | Status: AC
  Filled 2011-10-13: qty 2

## 2011-10-13 MED ORDER — LABETALOL HCL 5 MG/ML IV SOLN
20.0000 mg | INTRAVENOUS | Status: DC | PRN
  Filled 2011-10-13: qty 4

## 2011-10-13 MED ORDER — CEFAZOLIN SODIUM-DEXTROSE 2-3 GM-% IV SOLR: 2.0000 g | INTRAVENOUS | Status: DC

## 2011-10-13 MED ORDER — FENTANYL CITRATE 0.05 MG/ML IJ SOLN
INTRAMUSCULAR | Status: DC | PRN
  Administered 2011-10-13: 50 ug via INTRAVENOUS
  Administered 2011-10-13: 150 ug via INTRAVENOUS
  Administered 2011-10-13: 25 ug via INTRAVENOUS
  Administered 2011-10-13 (×4): 100 ug via INTRAVENOUS
  Administered 2011-10-13: 25 ug via INTRAVENOUS

## 2011-10-13 MED ORDER — NALOXONE HCL 0.4 MG/ML IJ SOLN: 0.4000 mg | INTRAMUSCULAR | Status: DC | PRN

## 2011-10-13 MED ORDER — ZOLPIDEM TARTRATE 5 MG PO TABS: 5.0000 mg | ORAL_TABLET | Freq: Every evening | ORAL | Status: DC | PRN

## 2011-10-13 MED ORDER — DIPHENHYDRAMINE HCL 12.5 MG/5ML PO ELIX
12.5000 mg | ORAL_SOLUTION | Freq: Four times a day (QID) | ORAL | Status: DC | PRN
  Administered 2011-10-14: 12.5 mg via ORAL
  Filled 2011-10-13: qty 5

## 2011-10-13 MED ORDER — KETOROLAC TROMETHAMINE 30 MG/ML IJ SOLN
30.0000 mg | Freq: Four times a day (QID) | INTRAMUSCULAR | Status: AC
  Administered 2011-10-14 – 2011-10-15 (×4): 30 mg via INTRAVENOUS
  Filled 2011-10-13 (×4): qty 1

## 2011-10-13 MED ORDER — MIDAZOLAM HCL 5 MG/5ML IJ SOLN
INTRAMUSCULAR | Status: DC | PRN
  Administered 2011-10-13 (×2): 2 mg via INTRAVENOUS

## 2011-10-13 MED ORDER — LIDOCAINE HCL (PF) 1 % IJ SOLN
INTRAMUSCULAR | Status: AC
  Filled 2011-10-13: qty 5

## 2011-10-13 MED ORDER — CHLORHEXIDINE GLUCONATE 0.12 % MT SOLN
15.0000 mL | Freq: Two times a day (BID) | OROMUCOSAL | Status: DC
  Administered 2011-10-13 – 2011-10-15 (×4): 15 mL via OROMUCOSAL
  Filled 2011-10-13 (×5): qty 15

## 2011-10-13 MED ORDER — LABETALOL HCL 200 MG PO TABS
200.0000 mg | ORAL_TABLET | Freq: Two times a day (BID) | ORAL | Status: DC
  Administered 2011-10-13 – 2011-10-15 (×4): 200 mg via ORAL
  Filled 2011-10-13 (×4): qty 1

## 2011-10-13 MED ORDER — PROMETHAZINE HCL 25 MG/ML IJ SOLN: 12.5000 mg | INTRAMUSCULAR | Status: DC | PRN

## 2011-10-13 MED ORDER — ROCURONIUM BROMIDE 50 MG/5ML IV SOLN
INTRAVENOUS | Status: AC
  Filled 2011-10-13: qty 1

## 2011-10-13 MED ORDER — MIDAZOLAM HCL 2 MG/2ML IJ SOLN
INTRAMUSCULAR | Status: AC
  Administered 2011-10-13: 2 mg via INTRAVENOUS
  Filled 2011-10-13: qty 2

## 2011-10-13 MED ORDER — PROPOFOL 10 MG/ML IV BOLUS
INTRAVENOUS | Status: DC | PRN
  Administered 2011-10-13: 170 mg via INTRAVENOUS

## 2011-10-13 MED ORDER — GLYCOPYRROLATE 0.2 MG/ML IJ SOLN
INTRAMUSCULAR | Status: AC
  Filled 2011-10-13: qty 2

## 2011-10-13 MED ORDER — ONDANSETRON HCL 4 MG/2ML IJ SOLN
4.0000 mg | Freq: Once | INTRAMUSCULAR | Status: AC | PRN
  Administered 2011-10-13: 4 mg via INTRAVENOUS

## 2011-10-13 MED ORDER — HYDROMORPHONE 0.3 MG/ML IV SOLN
INTRAVENOUS | Status: AC
  Filled 2011-10-13: qty 25

## 2011-10-13 MED ORDER — KETOROLAC TROMETHAMINE 30 MG/ML IJ SOLN
30.0000 mg | Freq: Once | INTRAMUSCULAR | Status: AC
  Administered 2011-10-13: 30 mg via INTRAVENOUS

## 2011-10-13 MED ORDER — PANTOPRAZOLE SODIUM 40 MG IV SOLR
40.0000 mg | Freq: Every day | INTRAVENOUS | Status: AC
  Administered 2011-10-13 – 2011-10-14 (×2): 40 mg via INTRAVENOUS
  Filled 2011-10-13 (×2): qty 40

## 2011-10-13 MED ORDER — DEXTROSE-NACL 5-0.9 % IV SOLN
INTRAVENOUS | Status: DC
  Administered 2011-10-13: 125 mL/h via INTRAVENOUS
  Administered 2011-10-13: via INTRAVENOUS
  Administered 2011-10-14 (×2): 1000 mL via INTRAVENOUS
  Administered 2011-10-15: 01:00:00 via INTRAVENOUS
  Administered 2011-10-15: 1000 mL via INTRAVENOUS

## 2011-10-13 MED ORDER — DIPHENHYDRAMINE HCL 50 MG/ML IJ SOLN: 12.5000 mg | Freq: Four times a day (QID) | INTRAMUSCULAR | Status: DC | PRN

## 2011-10-13 MED ORDER — LACTATED RINGERS IV SOLN
INTRAVENOUS | Status: DC
  Administered 2011-10-13 (×4): via INTRAVENOUS

## 2011-10-13 MED ORDER — DOCUSATE SODIUM 100 MG PO CAPS
100.0000 mg | ORAL_CAPSULE | Freq: Two times a day (BID) | ORAL | Status: DC
  Administered 2011-10-13 – 2011-10-15 (×4): 100 mg via ORAL
  Filled 2011-10-13 (×5): qty 1

## 2011-10-13 MED ORDER — CEFAZOLIN SODIUM 1-5 GM-% IV SOLN
INTRAVENOUS | Status: DC | PRN
  Administered 2011-10-13: 2 g via INTRAVENOUS

## 2011-10-13 MED ORDER — GLYCOPYRROLATE 0.2 MG/ML IJ SOLN
INTRAMUSCULAR | Status: DC | PRN
  Administered 2011-10-13: .6 mg via INTRAVENOUS

## 2011-10-13 MED ORDER — HYDROMORPHONE 0.3 MG/ML IV SOLN
INTRAVENOUS | Status: DC
  Administered 2011-10-13: 3.4 mg via INTRAVENOUS
  Administered 2011-10-13: 0.3 mg via INTRAVENOUS
  Administered 2011-10-14: 2.55 mg via INTRAVENOUS
  Administered 2011-10-14: 2.1 mg via INTRAVENOUS
  Administered 2011-10-14: 5.53 mg via INTRAVENOUS
  Administered 2011-10-14: 0.9 mg via INTRAVENOUS
  Administered 2011-10-14: 14:00:00 via INTRAVENOUS
  Administered 2011-10-14: 2.4 mg via INTRAVENOUS
  Administered 2011-10-14: 1.5 mg via INTRAVENOUS
  Administered 2011-10-15: 09:00:00 via INTRAVENOUS
  Administered 2011-10-15: 1.6 mg via INTRAVENOUS
  Administered 2011-10-15: 0.6 mg via INTRAVENOUS
  Administered 2011-10-15: 1.2 mg via INTRAVENOUS

## 2011-10-13 MED ORDER — ONDANSETRON HCL 4 MG/2ML IJ SOLN
INTRAMUSCULAR | Status: AC
  Administered 2011-10-13: 4 mg via INTRAVENOUS
  Filled 2011-10-13: qty 2

## 2011-10-13 MED ORDER — FENTANYL CITRATE 0.05 MG/ML IJ SOLN
INTRAMUSCULAR | Status: AC
  Administered 2011-10-13: 50 ug via INTRAVENOUS
  Filled 2011-10-13: qty 5

## 2011-10-13 MED ORDER — NEOSTIGMINE METHYLSULFATE 1 MG/ML IJ SOLN
INTRAMUSCULAR | Status: DC | PRN
  Administered 2011-10-13: 3 mg via INTRAVENOUS

## 2011-10-13 MED ORDER — FENTANYL CITRATE 0.05 MG/ML IJ SOLN
INTRAMUSCULAR | Status: DC | PRN
  Administered 2011-10-13: 50 ug via INTRAVENOUS
  Administered 2011-10-13: 100 ug via INTRAVENOUS
  Administered 2011-10-13: 50 ug via INTRAVENOUS
  Administered 2011-10-13: 100 ug via INTRAVENOUS
  Administered 2011-10-13: 50 ug via INTRAVENOUS
  Administered 2011-10-13: 150 ug via INTRAVENOUS
  Administered 2011-10-13: 50 ug via INTRAVENOUS
  Administered 2011-10-13 (×2): 100 ug via INTRAVENOUS

## 2011-10-13 MED ORDER — OXYCODONE-ACETAMINOPHEN 5-325 MG PO TABS: 1.0000 | ORAL_TABLET | ORAL | Status: DC | PRN

## 2011-10-13 MED ORDER — SODIUM CHLORIDE 0.9 % IR SOLN
Status: DC | PRN
  Administered 2011-10-13: 2000 mL

## 2011-10-13 MED ORDER — MIDAZOLAM HCL 2 MG/2ML IJ SOLN
1.0000 mg | INTRAMUSCULAR | Status: DC | PRN
  Administered 2011-10-13 (×2): 2 mg via INTRAVENOUS

## 2011-10-13 SURGICAL SUPPLY — 65 items
APPLIER CLIP 11 MED OPEN (CLIP)
APPLIER CLIP 13 LRG OPEN (CLIP)
BAG HAMPER (MISCELLANEOUS) ×2 IMPLANT
BENZOIN TINCTURE PRP APPL 2/3 (GAUZE/BANDAGES/DRESSINGS) ×2 IMPLANT
CELLS DAT CNTRL 66122 CELL SVR (MISCELLANEOUS) IMPLANT
CLIP APPLIE 11 MED OPEN (CLIP) IMPLANT
CLIP APPLIE 13 LRG OPEN (CLIP) IMPLANT
CLOSURE STERI STRIP 1/2 X4 (GAUZE/BANDAGES/DRESSINGS) ×2 IMPLANT
CLOTH BEACON ORANGE TIMEOUT ST (SAFETY) ×2 IMPLANT
COVER LIGHT HANDLE STERIS (MISCELLANEOUS) ×4 IMPLANT
DRAPE PROXIMA HALF (DRAPES) ×2 IMPLANT
DRAPE UTILITY W/TAPE 26X15 (DRAPES) IMPLANT
DRAPE WARM FLUID 44X44 (DRAPE) ×2 IMPLANT
DRESSING TELFA 8X3 (GAUZE/BANDAGES/DRESSINGS) ×4 IMPLANT
ELECT REM PT RETURN 9FT ADLT (ELECTROSURGICAL) ×2
ELECTRODE REM PT RTRN 9FT ADLT (ELECTROSURGICAL) ×1 IMPLANT
EVACUATOR DRAINAGE 10X20 100CC (DRAIN) IMPLANT
EVACUATOR SILICONE 100CC (DRAIN)
FORMALIN 10 PREFIL 480ML (MISCELLANEOUS) ×2 IMPLANT
GAUZE SPONGE 4X4 12PLY STRL LF (GAUZE/BANDAGES/DRESSINGS) ×2 IMPLANT
GLOVE BIOGEL PI IND STRL 6.5 (GLOVE) ×1 IMPLANT
GLOVE BIOGEL PI IND STRL 7.0 (GLOVE) ×2 IMPLANT
GLOVE BIOGEL PI INDICATOR 6.5 (GLOVE) ×1
GLOVE BIOGEL PI INDICATOR 7.0 (GLOVE) ×2
GLOVE ECLIPSE 9.0 STRL (GLOVE) ×2 IMPLANT
GLOVE INDICATOR STER SZ 9 (GLOVE) ×2 IMPLANT
GLOVE SKINSENSE NS SZ7.0 (GLOVE) ×1
GLOVE SKINSENSE STRL SZ7.0 (GLOVE) ×1 IMPLANT
GLOVE SS BIOGEL STRL SZ 6.5 (GLOVE) ×1 IMPLANT
GLOVE SUPERSENSE BIOGEL SZ 6.5 (GLOVE) ×1
GOWN STRL REIN 3XL LVL4 (GOWN DISPOSABLE) ×2 IMPLANT
GOWN STRL REIN XL XLG (GOWN DISPOSABLE) ×4 IMPLANT
INST SET MAJOR GENERAL (KITS) ×2 IMPLANT
KIT ROOM TURNOVER APOR (KITS) ×2 IMPLANT
MANIFOLD NEPTUNE II (INSTRUMENTS) ×2 IMPLANT
NS IRRIG 1000ML POUR BTL (IV SOLUTION) ×4 IMPLANT
PACK ABDOMINAL MAJOR (CUSTOM PROCEDURE TRAY) ×2 IMPLANT
RETRACTOR WND ALEXIS 25 LRG (MISCELLANEOUS) IMPLANT
RTRCTR WOUND ALEXIS 18CM MED (MISCELLANEOUS)
RTRCTR WOUND ALEXIS 25CM LRG (MISCELLANEOUS)
SEPRAFILM MEMBRANE 5X6 (MISCELLANEOUS) IMPLANT
SET BASIN LINEN APH (SET/KITS/TRAYS/PACK) ×2 IMPLANT
SOL PREP PROV IODINE SCRUB 4OZ (MISCELLANEOUS) ×2 IMPLANT
SPONGE DRAIN TRACH 4X4 STRL 2S (GAUZE/BANDAGES/DRESSINGS) IMPLANT
SPONGE GAUZE 4X4 12PLY (GAUZE/BANDAGES/DRESSINGS) ×2 IMPLANT
SPONGE LAP 18X18 X RAY DECT (DISPOSABLE) IMPLANT
STAPLER VISISTAT 35W (STAPLE) IMPLANT
STRIP CLOSURE SKIN 1/2X4 (GAUZE/BANDAGES/DRESSINGS) ×4 IMPLANT
SUT CHROMIC 0 CT 1 (SUTURE) ×46 IMPLANT
SUT CHROMIC 2 0 CT 1 (SUTURE) ×4 IMPLANT
SUT CHROMIC GUT BROWN 0 54 (SUTURE) IMPLANT
SUT CHROMIC GUT BROWN 0 54IN (SUTURE)
SUT ETHILON 3 0 FSL (SUTURE) IMPLANT
SUT PDS AB CT VIOLET #0 27IN (SUTURE) IMPLANT
SUT PLAIN CT 1/2CIR 2-0 27IN (SUTURE) ×6 IMPLANT
SUT PROLENE 0 CT 1 30 (SUTURE) ×4 IMPLANT
SUT VIC AB 0 CT1 27 (SUTURE)
SUT VIC AB 0 CT1 27XBRD ANTBC (SUTURE) IMPLANT
SUT VIC AB 2-0 CT1 27 (SUTURE)
SUT VIC AB 2-0 CT1 TAPERPNT 27 (SUTURE) IMPLANT
SUT VICRYL 3 0 (SUTURE) ×2 IMPLANT
SUT VICRYL 4 0 KS 27 (SUTURE) ×2 IMPLANT
TAPE CLOTH SURG 4X10 WHT LF (GAUZE/BANDAGES/DRESSINGS) ×2 IMPLANT
TOWEL BLUE STERILE X RAY DET (MISCELLANEOUS) ×2 IMPLANT
TRAY FOLEY CATH 14FR (SET/KITS/TRAYS/PACK) ×2 IMPLANT

## 2011-10-13 NOTE — Anesthesia Procedure Notes (Signed)
Procedure Name: Intubation Date/Time: 10/13/2011 11:54 AM Performed by: Despina Hidden Pre-anesthesia Checklist: Emergency Drugs available, Suction available, Patient identified and Patient being monitored Patient Re-evaluated:Patient Re-evaluated prior to inductionOxygen Delivery Method: Circle System Utilized Preoxygenation: Pre-oxygenation with 100% oxygen Intubation Type: IV induction Ventilation: Mask ventilation without difficulty Laryngoscope Size: 3 and Mac Grade View: Grade I Tube type: Oral Tube size: 7.0 mm Number of attempts: 1 Airway Equipment and Method: stylet Placement Confirmation: positive ETCO2,  breath sounds checked- equal and bilateral and ETT inserted through vocal cords under direct vision Secured at: 21 cm Tube secured with: Tape Dental Injury: Teeth and Oropharynx as per pre-operative assessment

## 2011-10-13 NOTE — Anesthesia Preprocedure Evaluation (Addendum)
Anesthesia Evaluation  Patient identified by MRN, date of birth, ID band Patient awake    Reviewed: Allergy & Precautions, H&P , NPO status , Patient's Chart, lab work & pertinent test results  Airway Mallampati: I TM Distance: >3 FB     Dental  (+) Teeth Intact   Pulmonary  clear to auscultation        Cardiovascular hypertension (prev hx, no meds now), Regular Normal    Neuro/Psych    GI/Hepatic   Endo/Other    Renal/GU      Musculoskeletal   Abdominal   Peds  Hematology   Anesthesia Other Findings   Reproductive/Obstetrics                           Anesthesia Physical Anesthesia Plan  ASA: II  Anesthesia Plan: General   Post-op Pain Management:    Induction: Intravenous  Airway Management Planned: Oral ETT  Additional Equipment:   Intra-op Plan:   Post-operative Plan:   Informed Consent: I have reviewed the patients History and Physical, chart, labs and discussed the procedure including the risks, benefits and alternatives for the proposed anesthesia with the patient or authorized representative who has indicated his/her understanding and acceptance.     Plan Discussed with:   Anesthesia Plan Comments:         Anesthesia Quick Evaluation

## 2011-10-13 NOTE — Transfer of Care (Signed)
Immediate Anesthesia Transfer of Care Note  Patient: Lauren Banks  Procedure(s) Performed:  HYSTERECTOMY ABDOMINAL  Patient Location: PACU  Anesthesia Type: General  Level of Consciousness: awake  Airway & Oxygen Therapy: Patient Spontanous Breathing and Patient connected to face mask oxygen  Post-op Assessment: Report given to PACU RN  Post vital signs: Reviewed and stable  Complications: No apparent anesthesia complications

## 2011-10-13 NOTE — Anesthesia Postprocedure Evaluation (Signed)
  Anesthesia Post-op Note  Patient: Lauren Banks  Procedure(s) Performed:  HYSTERECTOMY ABDOMINAL  Patient Location: PACU  Anesthesia Type: General  Level of Consciousness: awake and alert   Airway and Oxygen Therapy: Patient Spontanous Breathing and Patient connected to face mask oxygen  Post-op Pain: none  Post-op Assessment: Post-op Vital signs reviewed, Patient's Cardiovascular Status Stable, Respiratory Function Stable and No signs of Nausea or vomiting  Post-op Vital Signs: Reviewed and stable  Complications: No apparent anesthesia complications

## 2011-10-13 NOTE — Brief Op Note (Signed)
10/13/2011  2:16 PM  PATIENT:  Lauren Banks  45 y.o. female  PRE-OPERATIVE DIAGNOSIS:  uterine fibroids right ovarian cyst dyspareunia  POST-OPERATIVE DIAGNOSIS:  uterine fibroids right ovarian cyst  PROCEDURE:  Procedure(s): HYSTERECTOMY ABDOMINAL  SURGEON:  Surgeon(s): Tilda Burrow, MD  PHYSICIAN ASSISTANT:   ASSISTANTS:  Morrie Sheldon CST-FA ANESTHESIA:   general  EBL:  Total I/O In: 2350 [I.V.:2000; Blood:350] Out: 625 [Urine:125; Blood:500]  BLOOD ADMINISTERED:1 unit CC PRBC  DRAINS: Urinary Catheter (Foley)   LOCAL MEDICATIONS USED:  NONE  SPECIMEN:  Source of Specimen:  Uterus and cervix  DISPOSITION OF SPECIMEN:  PATHOLOGY  COUNTS:  YES  TOURNIQUET:  * No tourniquets in log *  DICTATION: .Dragon Dictation  PLAN OF CARE: Admit to inpatient   PATIENT DISPOSITION:  PACU - hemodynamically stable. will check hgb in PACU and consider 2nd unit prbc's   Delay start of Pharmacological VTE agent (>24hrs) due to surgical blood loss or risk of bleeding:  {YES/NO/NOT APPLICABLE:20182

## 2011-10-13 NOTE — Op Note (Signed)
06/26/2011  9:29 AM  PATIENT:  Lauren Banks  PRE-OPERATIVE DIAGNOSIS: Uterine fibroids 23 weeks size  POST-OPERATIVE DIAGNOSIS:  Same, removed  PROCEDURE:  Procedure(s): Total abdominal hysterectomy greater than 2000 g  SURGEON:Lory Galan Benancio Deeds, MD  PHYSICIAN ASSISTANT: None  ADDITIONAL ASSISTANTS: Morrie Sheldon R.N -- FA   ANESTHESIA:   general  ESTIMATED BLOOD LOSS: 550   BLOOD ADMINISTERED:1 unit CC PRBC  DRAINS: Urinary Catheter (Foley)   LOCAL MEDICATIONS USED:  NONE  SPECIMEN:  Source of Specimen:  uterus and cervix  DISPOSITION OF SPECIMEN:  PATHOLOGY  COUNTS:  YES  TOURNIQUET:  * No tourniquets in log *  DICTATION #:  Patient was taken to the operating room prepped and draped for abdominal is a antibiotics administered timeout conducted with Foley catheter in place. Vertical incision was made from the symphysis pubis to 2 cm above the umbilicus giving a circular cut around the left side of the umbilicus. Sharp dissection down to the fascia was performed with Bovie cautery and then entry of the fascia carefully performed. The huge uterus elevated and the bowel well out of the field. Minimal adhesions were encountered. The right ovary was enlarged edematous and flipped forward in front of the right round ligament evidence of recent ovulation on the right side existed left ovary was similarly normal.  Round ligaments were taken down on either side was double ligature of 0 chromic transection and isolation of the utero-ovarian ligaments bilaterally performed. Particular care was necessary during this portion the case to the huge vessels up to 1 cm in diameter for which we are dealing with. Backbleeding from the uterus was controlled by figure-of-eight sutures in the body of the uterus. Bladder  flap was developed anteriorly with difficulty due to anatomy. Balfour retractor was in place. 2 curved Heaney clamps were placed across the right uterine vessels with a Kelley clamp placed  to control backbleeding. Uterine vessels were doubly ligated after transection and backbleeding similarly controlled with figure-of-eight sutures. Left uterine vessels were treated similarly. On the left side the uterine vessels were very splayed and required to separate clamping due to the huge vessels spread through the broad ligament. Care was maintained to stay close to the uterus once uterine vessels were controlled, the huge uterine fundus was amputated off the lower uterine segment while maintaining protection of the bowel with laparotomy tapes and covered over with a broad malleable retractor Lower uterine segment was taken down by straight Heaney clamps knife dissection and 0 chromic suture ligature. A stab incision into the anterior cervicovaginal fornix could be performed and the cervix amputated from the very thickened port structures of the lower cardinal ligament and vaginal cuff by a Mayo scissor dissection. Cuff was closed with Aldridge stitches at each lateral vaginal angle in a series of interrupted 0 Vicryl sutures sewn anterior to posterior closed cut transversely. Pedicles were inspected. The left utero-ovarian pedicle required re\re suturing but otherwise pedicles were hemostatic. Pelvis was irrigated laparotomy, removed, and anterior peritoneum closed with 2-0 chromic, the fascia closed with running 0 Prolene using 2 sections, the subcutaneous tissues were reapproximated using interrupted 20 plain after irrigation, then subcuticular 4-0 Vicryl used to close the skin with a Mellody Dance needle. Sponge and needle counts correct EBL 550 cc condition to recovery in stable urine output greater than 200 cc intraoperative.  PLAN OF CARE: Inpatient care x2 days or more  PATIENT DISPOSITION:  PACU - hemodynamically stable.   Delay start of Pharmacological VTE agent (>24hrs) due to  surgical blood loss or risk of bleeding:  yes

## 2011-10-13 NOTE — H&P (Signed)
Lauren Banks is an 45 y.o. female. She is admitted at this time for total abdominal hysterectomy with preservation of tubes and ovaries if possible. She has multiple uterine fibroids with total uterine size 20.3 x 15.5 x 12.2 cm multiple fibroids present. Ovaries once showed a right simple cyst which was resolved on followup ultrasound pros and cons of ovarian preservation of been discussed and plans are to preserve the ovaries if technically possible and no visible abnormalities identified at the time of surgery. Additionally discussion of cervical removal was then performed lens are to remove the cervix as a portion of the case.  Endometrial biopsy is performed been performed which shows benign uterus and endocervical mucosa with nose abnormalities identified Pap smear 09/09/2011 is similarly normal.  Patient has had significant pelvic discomfort, been seen in the emergency room 08/31/2011 during her menses and requiring Dilaudid for the pain. Additionally there was severe anemia with hemoglobin documented at 6.5. This has recovered to 8.5. Patient is fully aware that transfusions are likely necessary as a portion of preoperative care plans are to transfuse 2 units packed cells upon arrival for the surgery and during the surgery. She declines the option of delayed surgery and Lupron suppression to reduce the likelihood of transfusion need. Specific risks associated with transfusion are discussed in detail including risk of infection associated with blood products. She has had a bowel evacuation prior to procedure.  Pertinent Gynecological History: Menses: flow is excessive with use of both pads or tampons on heaviest days Bleeding: heavy at menses, with associated anemia Contraception: abstinence since lmp DES exposure: unknown Blood transfusions: planned as part of surgery Sexually transmitted diseases: no past history Previous GYN Procedures: Last mammogram: normal Date:  Last pap: normal Date:  09/09/2011 OB History: G1, P0010   Menstrual History: Menarche age: 71 No LMP recorded.LMP 09/16/2011    Past Medical History  Diagnosis Date  . Anemia     No past surgical history on file.  Family History  Problem Relation Age of Onset  . Anesthesia problems Neg Hx     Social History:  reports that she has been smoking Cigarettes.  She has been smoking about 1 pack per day. She does not have any smokeless tobacco history on file. She reports that she does not drink alcohol or use illicit drugs.  Allergies: No Known Allergies  Prescriptions prior to admission  Medication Sig Dispense Refill  . FeFum-FePoly-FA-B Cmp-C-Biot (INTEGRA PLUS) CAPS Take 1 capsule by mouth daily.        Marland Kitchen HYDROcodone-acetaminophen (NORCO) 10-325 MG per tablet Take 1 tablet by mouth every 6 (six) hours as needed. For pain       . megestrol (MEGACE) 40 MG tablet Take 40 mg by mouth daily.        . diphenhydrAMINE (BENADRYL) 25 mg capsule Take 25 mg by mouth every 6 (six) hours as needed. allergies       . ranitidine (ZANTAC) 150 MG tablet Take 150 mg by mouth daily as needed. Acid reflex         ROS review of systems notable for menstrual pelvic pain requiring strong analgesics  There were no vitals taken for this visit. Physical ExamPhysical Examination: General appearance - alert, well appearing, and in no distress, oriented to person, place, and time, overweight, well hydrated and anxious Mental status - alert, oriented to person, place, and time, normal mood, behavior, speech, dress, motor activity, and thought processes, anxious Mouth - dental hygiene good Chest -  clear to auscultation, no wheezes, rales or rhonchi, symmetric air entry Heart - normal rate and regular rhythm, no murmurs noted Abdomen - large fibroid uterus to 23 weeks size, reaching above the umbilicus. Patient where a midline vertical incision to at least the umbilicus will be necessary for access. Pelvic - VULVA: normal  appearing vulva with no masses, tenderness or lesions, VAGINA: normal appearing vagina with normal color and discharge, no lesions, CERVIX: normal appearing cervix without discharge or lesions, DNA probe for chlamydia and GC obtained, UTERUS: uterus is normal size, shape, consistency and nontender, enlarged to 23 week's size, irregular contour due to fibroids , ADNEXA: , PAP: Pap smear done today, recent Pap result reviewed with patient, exam chaperoned by CP Extremities - no cyanosis clubbing or edema Skin - normal coloration and turgor, no rashes, no suspicious skin lesions noted  No results found for this or any previous visit (from the past 24 hour(s)).  No results found. CBC    Component Value Date/Time   WBC 10.9* 10/07/2011 1030   RBC 4.79 10/07/2011 1030   HGB 8.5* 10/07/2011 1030   HCT 30.4* 10/07/2011 1030   PLT 445* 10/07/2011 1030   MCV 63.5* 10/07/2011 1030   MCH 17.7* 10/07/2011 1030   MCHC 28.0* 10/07/2011 1030   RDW 27.5* 10/07/2011 1030      Assessment/Plan: Uterine Fibroids, 23 weeks, symptomatic Proceed toward abdominal hysterectomy with removal of cervix, and desired preservation of both ovaries. Anticipated transfusion.  Ruthe Roemer V 10/13/2011, 7:22 AM

## 2011-10-14 LAB — BASIC METABOLIC PANEL
CO2: 23 mEq/L (ref 19–32)
Calcium: 8.5 mg/dL (ref 8.4–10.5)
GFR calc Af Amer: >90 mL/min (ref >90)
GFR calc non Af Amer: >90 mL/min (ref >90)
Sodium: 133 mEq/L — ABNORMAL LOW (ref 135–145)

## 2011-10-14 LAB — CBC
MCH: 19.5 pg — ABNORMAL LOW (ref 26.0–34.0)
Platelets: 510 10*3/uL — ABNORMAL HIGH (ref 150–400)
RBC: 4.62 MIL/uL (ref 3.87–5.11)
RDW: 31.2 % — ABNORMAL HIGH (ref 11.5–15.5)

## 2011-10-14 MED ORDER — HYDROMORPHONE 0.3 MG/ML IV SOLN
INTRAVENOUS | Status: AC
  Administered 2011-10-14: 01:00:00
  Filled 2011-10-14: qty 25

## 2011-10-14 MED ORDER — SODIUM CHLORIDE 0.9 % IJ SOLN
INTRAMUSCULAR | Status: AC
  Administered 2011-10-14: 22:00:00
  Filled 2011-10-14: qty 3

## 2011-10-14 MED ORDER — HYDROMORPHONE 0.3 MG/ML IV SOLN
INTRAVENOUS | Status: AC
  Filled 2011-10-14: qty 25

## 2011-10-14 MED ORDER — CYCLOBENZAPRINE HCL 10 MG PO TABS
10.0000 mg | ORAL_TABLET | Freq: Three times a day (TID) | ORAL | Status: DC | PRN
  Administered 2011-10-14: 10 mg via ORAL
  Filled 2011-10-14: qty 1

## 2011-10-14 NOTE — Progress Notes (Signed)
1 Day Post-Op Procedure(s): HYSTERECTOMY ABDOMINAL  Subjective: Patient reports nausea, vomiting and no problems voiding.  Catheter has not been removed. Vomited x 1   Objective: I have reviewed patient's vital signs, intake and output and labs. Hemoglobin & Hematocrit     Component Value Date/Time   HGB 9.0* 10/14/2011 0528   HCT 31.3* 10/14/2011 0528  Temp:  [97 F (36.1 C)-98.6 F (37 C)] 97 F (36.1 C) (12/12 0543) Pulse Rate:  [73-96] 77  (12/12 0543) Resp:  [11-40] 18  (12/12 0800) BP: (131-198)/(69-109) 137/71 mmHg (12/12 0543) SpO2:  [96 %-100 %] 98 % (12/12 0800) Weight:  [101.606 kg (224 lb)] 224 lb (101.606 kg) (12/12 0052)  GI: normal findings: umbilicus normal and abdomen soft, no bruising and incision: clean, dry and dressing removed, steristrips intact   Assessment: s/p Procedure(s): HYSTERECTOMY ABDOMINAL: stable and slow return, as expected from hysterectomy   Plan: Advance diet Encourage ambulation d/c foley.  LOS: 1 day    Lauren Banks V 10/14/2011, 8:57 AM

## 2011-10-14 NOTE — Anesthesia Postprocedure Evaluation (Signed)
  Anesthesia Post-op Note  Patient: Lauren Banks  Procedure(s) Performed:  HYSTERECTOMY ABDOMINAL  Patient Location: Room 316  Anesthesia Type: General  Level of Consciousness: awake, alert , oriented and patient cooperative  Airway and Oxygen Therapy: Patient Spontanous Breathing  Post-op Pain: moderate  Post-op Assessment: Post-op Vital signs reviewed, Patient's Cardiovascular Status Stable, Respiratory Function Stable, Patent Airway, Adequate PO intake and Pain level controlled  Post-op Vital Signs: Reviewed and stable  Complications: No apparent anesthesia complications

## 2011-10-14 NOTE — Progress Notes (Signed)
UR Chart Review Completed  

## 2011-10-14 NOTE — Progress Notes (Signed)
CARE MANAGEMENT NOTE 10/14/2011  Patient:  Lauren Banks, Lauren Banks   Account Number:  [id number]  Date Initiated:  10/14/2011  Documentation initiated by:  Rosemary Holms  Subjective/Objective Assessment:   Pt admitted with uterine fibroids, right ovarian cyst. Complete Hysterectomy done.     Action/Plan:   Met with pt and spouse at bedside. At this time no HH needs identified. CM to follow.   Anticipated DC Date:  10/15/2011   Anticipated DC Plan:  HOME/SELF CARE         Choice offered to / List presented to:             Status of service:  In process, will continue to follow Medicare Important Message given?   (If response is "NO", the following Medicare IM given date fields will be blank) Date Medicare IM given:   Date Additional Medicare IM given:    Discharge Disposition:    Per UR Regulation:    Comments:  10/14/11 11:30 Syble Picco Leanord Hawking RN BSN

## 2011-10-14 NOTE — Addendum Note (Signed)
Addendum  created 10/14/11 1126 by Corena Pilgrim, CRNA   Modules edited:Notes Section

## 2011-10-14 NOTE — Progress Notes (Signed)
Foley removed at 12 pm

## 2011-10-15 MED ORDER — HYDROMORPHONE 0.3 MG/ML IV SOLN
INTRAVENOUS | Status: AC
  Filled 2011-10-15: qty 25

## 2011-10-15 MED ORDER — CYCLOBENZAPRINE HCL 10 MG PO TABS: 10.0000 mg | ORAL_TABLET | Freq: Three times a day (TID) | ORAL | Status: AC | PRN

## 2011-10-15 MED ORDER — HYDROCHLOROTHIAZIDE 25 MG PO TABS: 25.0000 mg | ORAL_TABLET | Freq: Every day | ORAL | Status: DC

## 2011-10-15 MED ORDER — OXYCODONE-ACETAMINOPHEN 5-325 MG PO TABS: 1.0000 | ORAL_TABLET | ORAL | Status: AC | PRN

## 2011-10-15 MED ORDER — DSS 100 MG PO CAPS: 100.0000 mg | ORAL_CAPSULE | Freq: Two times a day (BID) | ORAL | Status: AC

## 2011-10-15 MED ORDER — POTASSIUM CHLORIDE CRYS ER 20 MEQ PO TBCR: 40.0000 meq | EXTENDED_RELEASE_TABLET | Freq: Every day | ORAL | Status: DC

## 2011-10-15 MED ORDER — DIPHENHYDRAMINE HCL 12.5 MG/5ML PO ELIX: 12.5000 mg | ORAL_SOLUTION | Freq: Four times a day (QID) | ORAL | Status: DC | PRN

## 2011-10-15 NOTE — Progress Notes (Signed)
Pt discharged home with family in stable condition.  Pt instructed on new medications and follow up appt.  Pt instructed to contact MD if having any redness, yellow drainage or increased pain to incision site, or if having any persistent N/V.  Pt verbalizes understanding.  Pt instructed on incisional site care.

## 2011-10-16 LAB — TYPE AND SCREEN: Unit division: 0

## 2011-10-16 NOTE — Discharge Summary (Signed)
Physician Discharge Summary  Patient ID: Lauren Banks MRN: [id number] DOB/AGE: 46/10/1966 46 y.o.  Admit date: 10/13/2011 Discharge date: 10/16/2011  Admission Diagnoses: Uterine fibroids 23 weeks size symptomatic  Discharge Diagnoses: Same , removed Active Problems:  * No active hospital problems. *   Procedure: Total abdominal hysterectomy with preservation of tubes and ovaries Discharged Condition: good  Hospital Course: This 45 year old female was admitted for abdominal hysterectomy . See the history of present illness for details. She was anemic upon admission, received one unit packed red cells as transfusion preoperatively.  Abdominal hysterectomy was performed the midline vertical incision extending from symphysis pubis to above the umbilicus, with 550 cc blood loss the uterus weighed 2109 g and pathology report showed benign uterine fibroids, endometrial polyp, benign and normal cervix. Her hemoglobin was 9.0 above the 8.5 she had prior to perioperative transfusion. She tolerated regular diet within 2 days had a full postoperative course with pain management with with Dilaudid PCA. She was discharged home on meds listed below her followup in one week incision closure was with subcuticular closing uterus. Old 1 weight Consults: none  Significant Diagnostic Studies: labs:  Hemoglobin & Hematocrit     Component Value Date/Time   HGB 9.0* 10/14/2011 0528   HCT 31.3* 10/14/2011 0528     Treatments: surgery: Total abdominal hysterectomy, uterine weight 2000+ grams  Discharge Exam: Blood pressure 137/74, pulse 73, temperature 98.5 F (36.9 C), temperature source Oral, resp. rate 22, height 5\' 10"  (1.778 m), weight 106.3 kg (234 lb 5.6 oz), SpO2 100.00%. General appearance: alert and no distress GI: soft, non-tender; bowel sounds normal; no masses,  no organomegaly and Incision clean Steri-Strips in place no bruising or ecchymosis.  Disposition: Home or Self  Care  Discharge Orders    Future Orders Please Complete By Expires   Diet - low sodium heart healthy      Increase activity slowly      Discharge instructions      Comments:   General Gynecological Post-Operative Instructions You may expect to feel dizzy, weak, and drowsy for as long as 24 hours after receiving the medicine that made you sleep (anesthetic). The following information pertains to your recovery period for the first 24 hours following surgery.  Do not drive a car, ride a bicycle, participate in physical activities, or take public transportation until you are done taking narcotic pain medicines or as directed by your caregiver.  Do not drink alcohol or take tranquilizers.  Do not take medicine that has not been prescribed by your caregiver.  Do not sign important papers or make important decisions while on narcotic pain medicines.  Have a responsible person with you.  CARE OF INCISION  Keep incision clean and dry. Take showers instead of baths until your caregiver gives you permission to take baths.    Avoid heavy lifting (more than 10 pounds/4.5 kilograms), pushing, or pulling.  Avoid activities that may risk injury to your surgical site.  Only take over-the-counter or prescription medicines for pain, discomfort, or fever as directed by your caregiver. Do not take aspirin. It can make you bleed. Take medicines (antibiotics) that kill germs as directed.  Call the office or go to the MAU if:  You feel sick to your stomach (nauseous).  You start to throw up (vomit).  You have trouble eating or drinking.  You have an oral temperature above 100.4.  You have constipation that is not helped by adjusting diet or increasing fluid intake. Pain medicines  are a common cause of constipation.  SEEK IMMEDIATE MEDICAL CARE IF:  You have persistent dizziness.  You have difficulty breathing or a congested sounding (croupy) cough.  You have an oral temperature above 100.5, not controlled by  medicine.  There is increasing pain or tenderness near or in the surgical site.  ExitCare Patient Information 2011 [place], [place].   Sexual Activity Restrictions      Comments:   6 weeks abstinent   Driving Restrictions      Comments:   2week restriction   Remove dressing in 24 hours      Call MD for:  redness, tenderness, or signs of infection (pain, swelling, redness, odor or green/yellow discharge around incision site)        Discharge Medication List as of 10/15/2011 12:05 PM    START taking these medications   Details  cyclobenzaprine (FLEXERIL) 10 MG tablet Take 1 tablet (10 mg total) by mouth 3 (three) times daily as needed for muscle spasms., Starting 10/15/2011, Until Sun 10/25/11, Print    diphenhydrAMINE (BENADRYL) 12.5 MG/5ML elixir Take 5 mLs (12.5 mg total) by mouth every 6 (six) hours as needed for itching., Starting 10/15/2011, Until Sun 10/25/11, Print    docusate sodium 100 MG CAPS Take 100 mg by mouth 2 (two) times daily., Starting 10/15/2011, Until Sun 10/25/11, Print    hydrochlorothiazide (HYDRODIURIL) 25 MG tablet Take 1 tablet (25 mg total) by mouth daily., Starting 10/15/2011, Until Fri 10/14/12, Print    oxyCODONE-acetaminophen (PERCOCET) 5-325 MG per tablet Take 1-2 tablets by mouth every 4 (four) hours as needed (moderate to severe pain (when tolerating fluids))., Starting 10/15/2011, Until Sun 10/25/11, Print    potassium chloride SA (K-DUR,KLOR-CON) 20 MEQ tablet Take 2 tablets (40 mEq total) by mouth daily., Starting 10/15/2011, Until Fri 10/14/12, Print      CONTINUE these medications which have NOT CHANGED   Details  diphenhydrAMINE (BENADRYL) 25 mg capsule Take 25 mg by mouth every 6 (six) hours as needed. allergies , Until Discontinued, Historical Med    ranitidine (ZANTAC) 150 MG tablet Take 150 mg by mouth daily as needed. Acid reflex , Until Discontinued, Historical Med      STOP taking these medications     FeFum-FePoly-FA-B Cmp-C-Biot  (INTEGRA PLUS) CAPS      HYDROcodone-acetaminophen (NORCO) 10-325 MG per tablet      megestrol (MEGACE) 40 MG tablet        Follow-up Information    Follow up with Tilda Burrow, MD in 8 days.   Contact information:   [facility] [address], [address] [place] [address] [phone]          Signed: Tilda Burrow 10/16/2011, 11:36 AM

## 2011-10-19 ENCOUNTER — Encounter (HOSPITAL_COMMUNITY): Payer: Self-pay | Admitting: Obstetrics and Gynecology

## 2013-05-14 ENCOUNTER — Encounter (HOSPITAL_COMMUNITY): Payer: Self-pay | Admitting: *Deleted

## 2013-05-14 ENCOUNTER — Emergency Department (HOSPITAL_COMMUNITY)
Admission: EM | Admit: 2013-05-14 | Discharge: 2013-05-14 | Disposition: A | Discharge status: Home / Self Care | Payer: BC Managed Care – PPO | Attending: Emergency Medicine | Admitting: Emergency Medicine

## 2013-05-14 ENCOUNTER — Emergency Department (HOSPITAL_COMMUNITY): Payer: BC Managed Care – PPO

## 2013-05-14 DIAGNOSIS — R110 Nausea: Secondary | ICD-10-CM | POA: Insufficient documentation

## 2013-05-14 DIAGNOSIS — Z90710 Acquired absence of both cervix and uterus: Secondary | ICD-10-CM | POA: Insufficient documentation

## 2013-05-14 DIAGNOSIS — Z862 Personal history of diseases of the blood and blood-forming organs and certain disorders involving the immune mechanism: Secondary | ICD-10-CM | POA: Insufficient documentation

## 2013-05-14 DIAGNOSIS — Z7982 Long term (current) use of aspirin: Secondary | ICD-10-CM | POA: Insufficient documentation

## 2013-05-14 DIAGNOSIS — Z880 Allergy status to penicillin: Secondary | ICD-10-CM | POA: Insufficient documentation

## 2013-05-14 DIAGNOSIS — R109 Unspecified abdominal pain: Secondary | ICD-10-CM | POA: Insufficient documentation

## 2013-05-14 DIAGNOSIS — B9689 Other specified bacterial agents as the cause of diseases classified elsewhere: Secondary | ICD-10-CM

## 2013-05-14 DIAGNOSIS — F17200 Nicotine dependence, unspecified, uncomplicated: Secondary | ICD-10-CM | POA: Insufficient documentation

## 2013-05-14 DIAGNOSIS — N760 Acute vaginitis: Secondary | ICD-10-CM | POA: Insufficient documentation

## 2013-05-14 DIAGNOSIS — Z9079 Acquired absence of other genital organ(s): Secondary | ICD-10-CM | POA: Insufficient documentation

## 2013-05-14 DIAGNOSIS — K5900 Constipation, unspecified: Secondary | ICD-10-CM | POA: Insufficient documentation

## 2013-05-14 LAB — COMPREHENSIVE METABOLIC PANEL
ALT: 8 U/L (ref 0–35)
Alkaline Phosphatase: 61 U/L (ref 39–117)
CO2: 23 mEq/L (ref 19–32)
GFR calc Af Amer: >90 mL/min (ref >90)
GFR calc non Af Amer: >90 mL/min (ref >90)
Glucose, Bld: 91 mg/dL (ref 70–99)
Potassium: 3.2 mEq/L — ABNORMAL LOW (ref 3.5–5.1)
Sodium: 131 mEq/L — ABNORMAL LOW (ref 135–145)

## 2013-05-14 LAB — CBC WITH DIFFERENTIAL/PLATELET
Basophils Absolute: 0.1 10*3/uL (ref 0.0–0.1)
Basophils Relative: 0 % (ref 0–1)
Eosinophils Absolute: 0.9 10*3/uL — ABNORMAL HIGH (ref 0.0–0.7)
HCT: 41.9 % (ref 36.0–46.0)
MCHC: 34.6 g/dL (ref 30.0–36.0)
Monocytes Absolute: 0.7 10*3/uL (ref 0.1–1.0)
Neutro Abs: 7.9 10*3/uL — ABNORMAL HIGH (ref 1.7–7.7)
Neutrophils Relative %: 61 % (ref 43–77)
RDW: 13.5 % (ref 11.5–15.5)

## 2013-05-14 LAB — WET PREP, GENITAL

## 2013-05-14 LAB — URINALYSIS, ROUTINE W REFLEX MICROSCOPIC
Glucose, UA: NEGATIVE mg/dL
Hgb urine dipstick: NEGATIVE
Specific Gravity, Urine: <1.005 — ABNORMAL LOW (ref 1.005–1.030)
pH: 7 (ref 5.0–8.0)

## 2013-05-14 MED ORDER — HYDROMORPHONE HCL PF 1 MG/ML IJ SOLN
0.5000 mg | Freq: Once | INTRAMUSCULAR | Status: AC
  Administered 2013-05-14: 0.5 mg via INTRAVENOUS

## 2013-05-14 MED ORDER — HYDROMORPHONE HCL PF 1 MG/ML IJ SOLN
1.0000 mg | Freq: Once | INTRAMUSCULAR | Status: AC
  Administered 2013-05-14: 1 mg via INTRAVENOUS
  Filled 2013-05-14: qty 1

## 2013-05-14 MED ORDER — ONDANSETRON HCL 4 MG/2ML IJ SOLN
INTRAMUSCULAR | Status: AC
  Administered 2013-05-14: 4 mg via INTRAVENOUS
  Filled 2013-05-14: qty 2

## 2013-05-14 MED ORDER — OXYCODONE-ACETAMINOPHEN 5-325 MG PO TABS: 2.0000 | ORAL_TABLET | ORAL | Status: DC | PRN

## 2013-05-14 MED ORDER — IOHEXOL 300 MG/ML  SOLN
100.0000 mL | Freq: Once | INTRAMUSCULAR | Status: AC | PRN
  Administered 2013-05-14: 100 mL via INTRAVENOUS

## 2013-05-14 MED ORDER — ONDANSETRON HCL 4 MG/2ML IJ SOLN
4.0000 mg | Freq: Once | INTRAMUSCULAR | Status: AC
  Administered 2013-05-14: 4 mg via INTRAVENOUS

## 2013-05-14 MED ORDER — ONDANSETRON HCL 4 MG/2ML IJ SOLN
4.0000 mg | Freq: Once | INTRAMUSCULAR | Status: AC
  Administered 2013-05-14: 4 mg via INTRAVENOUS
  Filled 2013-05-14: qty 2

## 2013-05-14 MED ORDER — METRONIDAZOLE 500 MG PO TABS: 500.0000 mg | ORAL_TABLET | Freq: Two times a day (BID) | ORAL | Status: DC

## 2013-05-14 MED ORDER — FLUCONAZOLE 150 MG PO TABS: 150.0000 mg | ORAL_TABLET | Freq: Once | ORAL | Status: DC

## 2013-05-14 MED ORDER — HYDROMORPHONE HCL PF 1 MG/ML IJ SOLN
INTRAMUSCULAR | Status: AC
  Filled 2013-05-14: qty 1

## 2013-05-14 MED ORDER — IOHEXOL 300 MG/ML  SOLN
50.0000 mL | Freq: Once | INTRAMUSCULAR | Status: AC | PRN
  Administered 2013-05-14: 50 mL via ORAL

## 2013-05-14 NOTE — ED Notes (Signed)
Pt c/o right side lower abd pain, nausea, constipation that started Wednesday, denies any fever, vomiting, diarrhea. Did take a laxative with results this am

## 2013-05-14 NOTE — ED Provider Notes (Signed)
History    This chart was scribed for Lauren Hutching, MD by Quintella Reichert, ED scribe.  This patient was seen in room [facility]14[facility]14 and the patient's care was started at 4:19 PM.  CSN: [id number]  Arrival date & time 05/14/13  1503     Chief Complaint  Patient presents with  . Abdominal Pain    The history is provided by the patient. No language interpreter was used.    HPI Comments: Lauren Banks is a 47 y.o. female who presents to the Emergency Department complaining of 4 days of constant, moderate right lower abdominal pain, with accompanying nausea and constipation.  Pt localizes pain to the RLQ and right suprapubic area.  She mentions that pressing on the area does not exacerbate pain but does cause it to radiate to her right flank.  She initially attributed her pain to gas or a muscle spasm but she notes that pain was not relieved by anti-gas medication, ibuprofen or muscle relaxants.  Pain was slightly relieved by hydrocodone.  Pt is ambulatory.  She notes that she has been eating less than usual since pain began but she has been drinking normally.   She states that her constipation may be attributable to hydrocodone and was relieved by a laxative taken this morning.  She denies vaginal bleeding or discharge, difficulty urinating, or any other associated symptoms.  Pt has had a partial hysterectomy and unilateral oophorectomy in December 2012 (Dr. Emelda Fear).  She denies any other abdominal surgeries.    Past Medical History  Diagnosis Date  . Anemia     Past Surgical History  Procedure Laterality Date  . Abdominal hysterectomy  10/13/2011    Procedure: HYSTERECTOMY ABDOMINAL;  Surgeon: Tilda Burrow, MD;  Location: [facility];  Service: Gynecology;  Laterality: N/A;    Family History  Problem Relation Age of Onset  . Anesthesia problems Neg Hx     History  Substance Use Topics  . Smoking status: Current Every Day Smoker -- 1.00 packs/day    Types: Cigarettes  . Smokeless  tobacco: Not on file  . Alcohol Use: No    OB History   Grav Para Term Preterm Abortions TAB SAB Ect Mult Living                   Review of Systems A complete 10 system review of systems was obtained and all systems are negative except as noted in the HPI and PMH.     Allergies  Penicillins  Home Medications   Current Outpatient Rx  Name  Route  Sig  Dispense  Refill  . Aspirin-Salicylamide-Caffeine (BC HEADACHE POWDER PO)   Oral   Take 1 each by mouth daily as needed (headache).         . hydrochlorothiazide (HYDRODIURIL) 25 MG tablet   Oral   Take 1 tablet (25 mg total) by mouth daily.   30 tablet   6   . lisinopril (PRINIVIL,ZESTRIL) 10 MG tablet   Oral   Take 10 mg by mouth daily.          BP 131/91  Pulse 92  Temp(Src) 97.6 F (36.4 C) (Oral)  Resp 18  Ht 5\' 10"  (1.778 m)  Wt 232 lb (105.235 kg)  BMI 33.29 kg/m2  SpO2 98%  LMP 09/21/2011  Physical Exam  Nursing note and vitals reviewed. Constitutional: She is oriented to person, place, and time. She appears well-developed and well-nourished.  HENT:  Head: Normocephalic and atraumatic.  Eyes: Conjunctivae and EOM are normal. Pupils are equal, round, and reactive to light.  Neck: Normal range of motion. Neck supple.  Cardiovascular: Normal rate, regular rhythm and normal heart sounds.   Pulmonary/Chest: Effort normal and breath sounds normal.  Abdominal: Soft. Bowel sounds are normal. She exhibits no distension. There is tenderness. There is no rebound and no guarding.  Mildly tender to RLQ and inferior aspect of right flank  Genitourinary:  Normal external genitalia.   White milky material in the vault of the vagina.   No cervix identified.   Slight right adnexal tenderness  Musculoskeletal: Normal range of motion.  Neurological: She is alert and oriented to person, place, and time.  Skin: Skin is warm and dry.  Psychiatric: She has a normal mood and affect.    ED Course  Procedures  (including critical care time)  DIAGNOSTIC STUDIES: Oxygen Saturation is 98% on room air, normal by my interpretation.    COORDINATION OF CARE: 4:23 PM-Discussed treatment plan which includes pain medication, CT-scan and labs with pt at bedside and pt agreed to plan.    Results for orders placed during the hospital encounter of 05/14/13  URINALYSIS, ROUTINE W REFLEX MICROSCOPIC      Result Value Range   Color, Urine YELLOW  YELLOW   APPearance CLEAR  CLEAR   Specific Gravity, Urine <1.005 (*) 1.005 - 1.030   pH 7.0  5.0 - 8.0   Glucose, UA NEGATIVE  NEGATIVE mg/dL   Hgb urine dipstick NEGATIVE  NEGATIVE   Bilirubin Urine NEGATIVE  NEGATIVE   Ketones, ur NEGATIVE  NEGATIVE mg/dL   Protein, ur NEGATIVE  NEGATIVE mg/dL   Urobilinogen, UA 0.2  0.0 - 1.0 mg/dL   Nitrite NEGATIVE  NEGATIVE   Leukocytes, UA NEGATIVE  NEGATIVE  COMPREHENSIVE METABOLIC PANEL      Result Value Range   Sodium 131 (*) 135 - 145 mEq/L   Potassium 3.2 (*) 3.5 - 5.1 mEq/L   Chloride 98  96 - 112 mEq/L   CO2 23  19 - 32 mEq/L   Glucose, Bld 91  70 - 99 mg/dL   BUN 15  6 - 23 mg/dL   Creatinine, Ser 1.61  0.50 - 1.10 mg/dL   Calcium 9.9  8.4 - 09.6 mg/dL   Total Protein 7.0  6.0 - 8.3 g/dL   Albumin 3.5  3.5 - 5.2 g/dL   AST 15  0 - 37 U/L   ALT 8  0 - 35 U/L   Alkaline Phosphatase 61  39 - 117 U/L   Total Bilirubin 0.4  0.3 - 1.2 mg/dL   GFR calc non Af Amer >90  >90 mL/min   GFR calc Af Amer >90  >90 mL/min  CBC WITH DIFFERENTIAL      Result Value Range   WBC 13.0 (*) 4.0 - 10.5 K/uL   RBC 5.12 (*) 3.87 - 5.11 MIL/uL   Hemoglobin 14.5  12.0 - 15.0 g/dL   HCT 04.5  40.9 - 81.1 %   MCV 81.8  78.0 - 100.0 fL   MCH 28.3  26.0 - 34.0 pg   MCHC 34.6  30.0 - 36.0 g/dL   RDW 91.4  78.2 - 95.6 %   Platelets 303  150 - 400 K/uL   Neutrophils Relative % 61  43 - 77 %   Neutro Abs 7.9 (*) 1.7 - 7.7 K/uL   Lymphocytes Relative 27  12 - 46 %   Lymphs Abs 3.5  0.7 - 4.0 K/uL   Monocytes Relative 5  3 - 12  %   Monocytes Absolute 0.7  0.1 - 1.0 K/uL   Eosinophils Relative 7 (*) 0 - 5 %   Eosinophils Absolute 0.9 (*) 0.0 - 0.7 K/uL   Basophils Relative 0  0 - 1 %   Basophils Absolute 0.1  0.0 - 0.1 K/uL  LIPASE, BLOOD      Result Value Range   Lipase 27  11 - 59 U/L     Ct Abdomen Pelvis W Contrast  05/14/2013   *RADIOLOGY REPORT*  Clinical Data: Right lower quadrant pain.  History of hysterectomy.  CT ABDOMEN AND PELVIS WITH CONTRAST  Technique:  Multidetector CT imaging of the abdomen and pelvis was performed following the standard protocol during bolus administration of intravenous contrast.  Contrast: 50mL OMNIPAQUE IOHEXOL 300 MG/ML  SOLN, OMNIPAQUE IOHEXOL 300 MG/ML  SOLN  Comparison: None.  Findings: There is bibasilar atelectasis.  No evidence for pneumoperitoneum.  Along the right side of the heart, there is a round density along the periphery of the right atrium that is probably associated with normal cardiac structures.  This is seen on images number 5 and 6.  There is a mildly hypodense structure in the caudate lobe of the liver.  The structure roughly measures 1.9 cm and is indeterminate. There is subtle low density structure along the dome of the liver on image number 11.  Normal appearance of the spleen, pancreas, adrenal glands and both kidneys.  No significant free fluid or lymphadenopathy.  Uterus has been removed.  A peripherally dense structure in the left adnexa measures 1.8 cm and may be related to an involuting cyst.  Normal appearance of the appendix.  Prominent lymph nodes in the ileocecal mesentery are nonspecific.  There is fluid in the urinary bladder.  Severe disc space loss at L5-S1.  Evidence for 1.1 cm cyst in the right kidney lower pole on the delayed images.  IMPRESSION:  No acute abnormality within the abdomen or pelvis.  1.9 cm indeterminate structure within the caudate of the liver. Findings are not suggestive for a simple cyst.  This could represent a cavernous  hemangioma but nonspecific on this examination.  This could be further characterized with MRI.   Original Report Authenticated By: Richarda Overlie, M.D.      No diagnosis found.   MDM  Uncertain etiology of patient's right lower quadrant pain.   No acute abdomen. Rx Flagyl for bacterial vaginosis and Percocet for pain. Patient has gynecological followup    I personally performed the services described in this documentation, which was scribed in my presence. The recorded information has been reviewed and is accurate.    Lauren Hutching, MD 05/14/13 2156

## 2013-05-14 NOTE — ED Notes (Signed)
Pt presents with right lower abdominal pain x 3 days that has progressively gotten worse over time. Pt reports nausea but no emesis or diarrhea. Pt states has one ovary left on the right side. Denies fever. Pt is very restless and tearful at this time. EDP notified.

## 2013-05-16 ENCOUNTER — Encounter: Payer: Self-pay | Admitting: Obstetrics & Gynecology

## 2013-05-16 ENCOUNTER — Ambulatory Visit (INDEPENDENT_AMBULATORY_CARE_PROVIDER_SITE_OTHER): Payer: BC Managed Care – PPO | Admitting: Obstetrics & Gynecology

## 2013-05-16 VITALS — BP 138/90 | Ht 70.0 in | Wt 235.0 lb

## 2013-05-16 DIAGNOSIS — M549 Dorsalgia, unspecified: Secondary | ICD-10-CM

## 2013-05-16 DIAGNOSIS — R1031 Right lower quadrant pain: Secondary | ICD-10-CM

## 2013-05-16 LAB — GC/CHLAMYDIA PROBE AMP: GC Probe RNA: NEGATIVE

## 2013-05-16 NOTE — Progress Notes (Signed)
Patient ID: Lauren Banks, female   DOB: [birth year], 47 y.o.   MRN: [id number] The patient comes in as a referral from the emergency room She presented to the ER and Sunday with right lower quadrant pain which began after she turned over in bed and to the thigh and She's also been having back spasm has had that before  CT scan was essentially unremarkable All labs were essentially unremarkable  On exam today in the office she has what appears to be musculoskeletal or cutaneous pain could be from a neurogenic sources well When she does a chronic very light palpation produces significant pain Her back is full of trigger points mostly down the right paraspinous muscle  I injected her right lower quadrant trigger point in 3 church her points of her right paraspinous She will continue to use Flexeril ice and bio freeze and seek further care of a chiropractor  I do not believe this is GYN source pain it seems to be all musculoskeletal and neurogenic

## 2013-05-16 NOTE — Patient Instructions (Addendum)
Back Pain, Adult  Low back pain is very common. About 1 in 5 people have back pain. The cause of low back pain is rarely dangerous. The pain often gets better over time. About half of people with a sudden onset of back pain feel better in just 2 weeks. About 8 in 10 people feel better by 6 weeks.   CAUSES  Some common causes of back pain include:  · Strain of the muscles or ligaments supporting the spine.  · Wear and tear (degeneration) of the spinal discs.  · Arthritis.  · Direct injury to the back.  DIAGNOSIS  Most of the time, the direct cause of low back pain is not known. However, back pain can be treated effectively even when the exact cause of the pain is unknown. Answering your caregiver's questions about your overall health and symptoms is one of the most accurate ways to make sure the cause of your pain is not dangerous. If your caregiver needs more information, he or she may order lab work or imaging tests (X-rays or MRIs). However, even if imaging tests show changes in your back, this usually does not require surgery.  HOME CARE INSTRUCTIONS  For many people, back pain returns. Since low back pain is rarely dangerous, it is often a condition that people can learn to manage on their own.   · Remain active. It is stressful on the back to sit or stand in one place. Do not sit, drive, or stand in one place for more than 30 minutes at a time. Take short walks on level surfaces as soon as pain allows. Try to increase the length of time you walk each day.  · Do not stay in bed. Resting more than 1 or 2 days can delay your recovery.  · Do not avoid exercise or work. Your body is made to move. It is not dangerous to be active, even though your back may hurt. Your back will likely heal faster if you return to being active before your pain is gone.  · Pay attention to your body when you  bend and lift. Many people have less discomfort when lifting if they bend their knees, keep the load close to their bodies, and  avoid twisting. Often, the most comfortable positions are those that put less stress on your recovering back.  · Find a comfortable position to sleep. Use a firm mattress and lie on your side with your knees slightly bent. If you lie on your back, put a pillow under your knees.  · Only take over-the-counter or prescription medicines as directed by your caregiver. Over-the-counter medicines to reduce pain and inflammation are often the most helpful. Your caregiver may prescribe muscle relaxant drugs. These medicines help dull your pain so you can more quickly return to your normal activities and healthy exercise.  · Put ice on the injured area.  · Put ice in a plastic bag.  · Place a towel between your skin and the bag.  · Leave the ice on for 15-20 minutes, 3-4 times a day for the first 2 to 3 days. After that, ice and heat may be alternated to reduce pain and spasms.  · Ask your caregiver about trying back exercises and gentle massage. This may be of some benefit.  · Avoid feeling anxious or stressed. Stress increases muscle tension and can worsen back pain. It is important to recognize when you are anxious or stressed and learn ways to manage it. Exercise is a great option.  SEEK MEDICAL CARE IF:  · You have pain that is not relieved with rest or   medicine.  · You have pain that does not improve in 1 week.  · You have new symptoms.  · You are generally not feeling well.  SEEK IMMEDIATE MEDICAL CARE IF:   · You have pain that radiates from your back into your legs.  · You develop new bowel or bladder control problems.  · You have unusual weakness or numbness in your arms or legs.  · You develop nausea or vomiting.  · You develop abdominal pain.  · You feel faint.  Document Released: 10/19/2005 Document Revised: 04/19/2012 Document Reviewed: 03/09/2011  Exit[employer]® Patient Information ©2014 [employer].

## 2013-05-22 ENCOUNTER — Ambulatory Visit: Payer: Self-pay | Admitting: Obstetrics and Gynecology

## 2013-05-22 MED FILL — Oxycodone w/ Acetaminophen Tab 5-325 MG: ORAL | Qty: 6 | Status: AC

## 2013-06-12 IMAGING — US US PELVIS COMPLETE
1 series · 13 of 25 positions shown · non-contrast
Comparison: None

CLINICAL DATA: Right pelvic pain, question uterine mass on exam

TRANSABDOMINAL AND TRANSVAGINAL ULTRASOUND OF PELVIS
TECHNIQUE: Both transabdominal and transvaginal ultrasound
examinations of the pelvis were performed. Transabdominal technique
was performed for global imaging of the pelvis including uterus,
ovaries, adnexal regions, and pelvic cul-de-sac. Transvaginal
imaging limited by uterine size.

[Series 1: us pelvis complete · 0.38mm/px · 85 acquisitions, 13 frames shown]
[im 1/85]
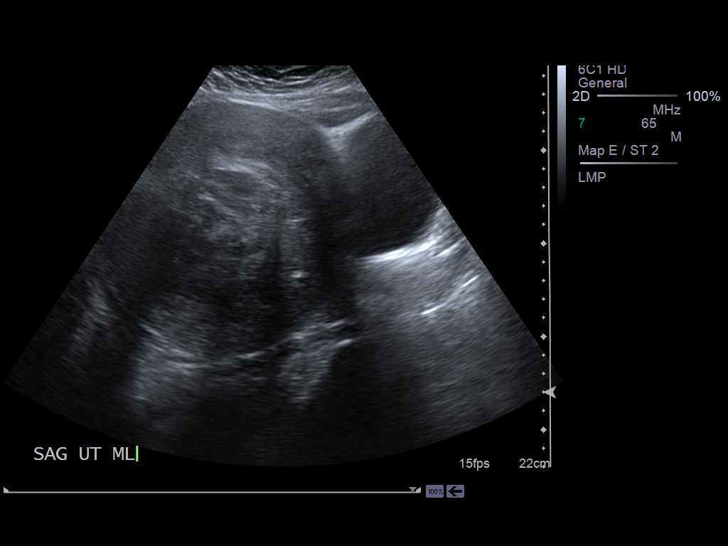
[im 8/85]
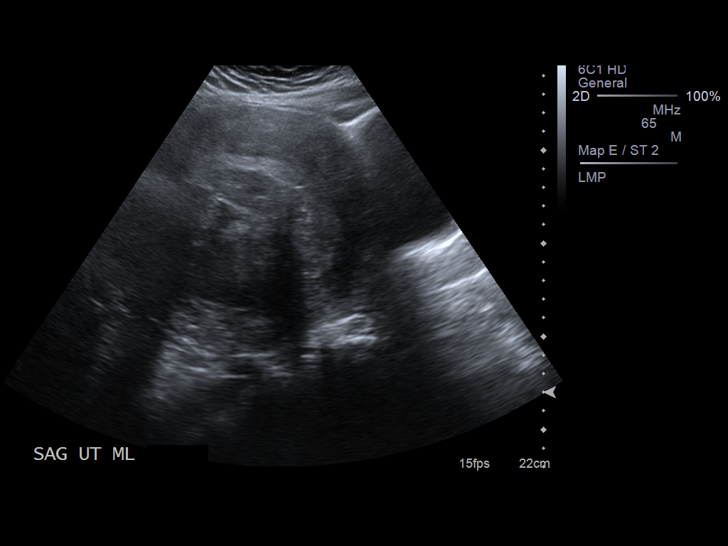
[im 15/85]
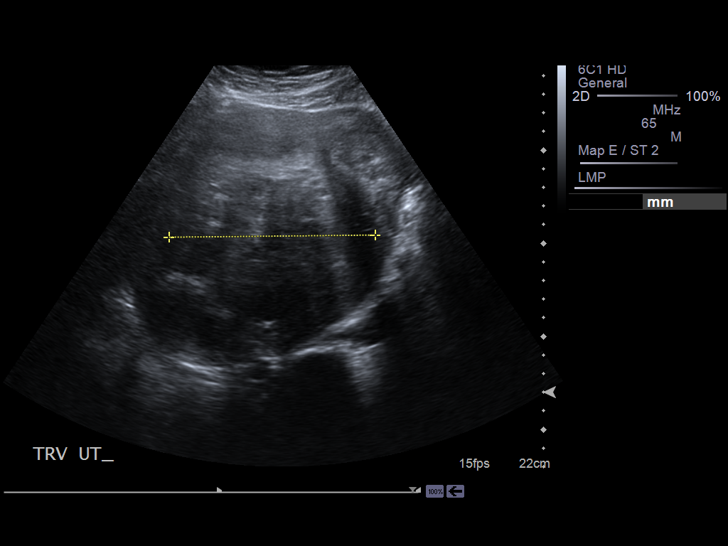
[im 22/85]
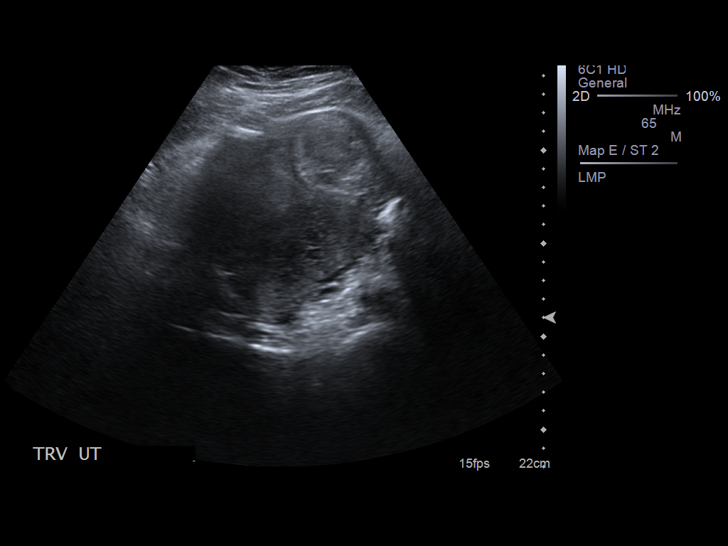
[im 29/85]
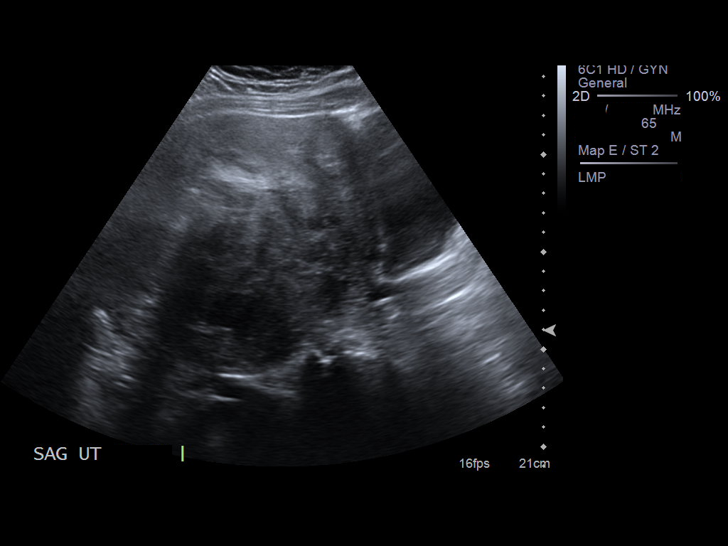
[im 36/85]
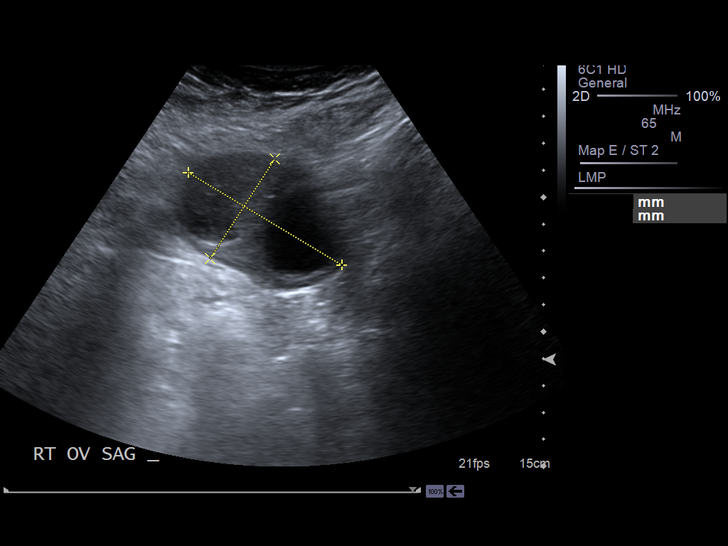
[im 43/85]
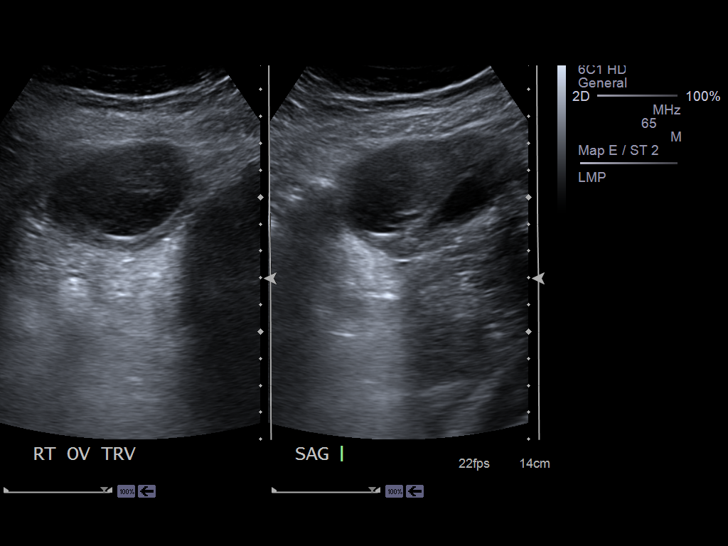
[im 50/85]
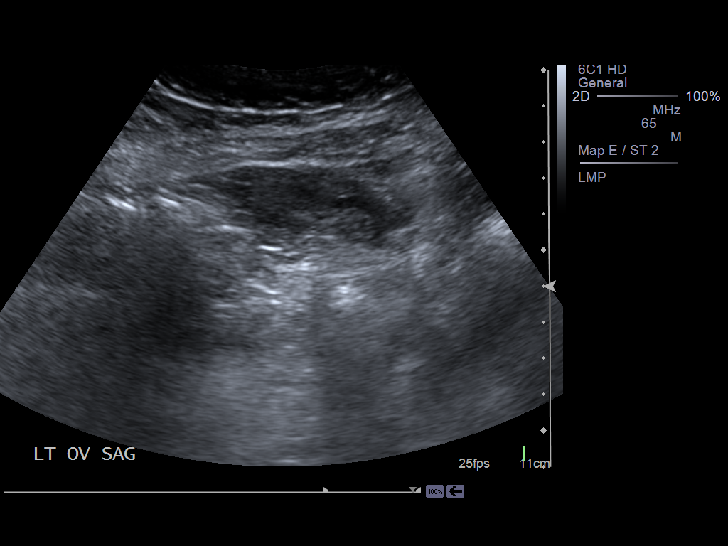
[im 57/85]
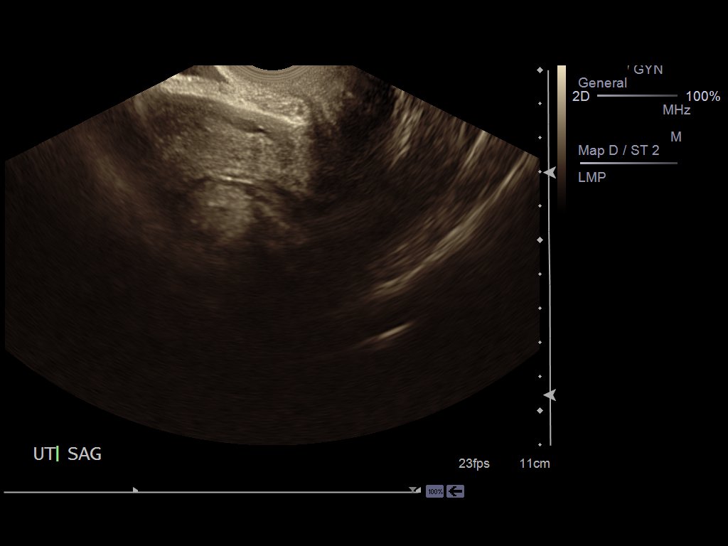
[im 64/85]
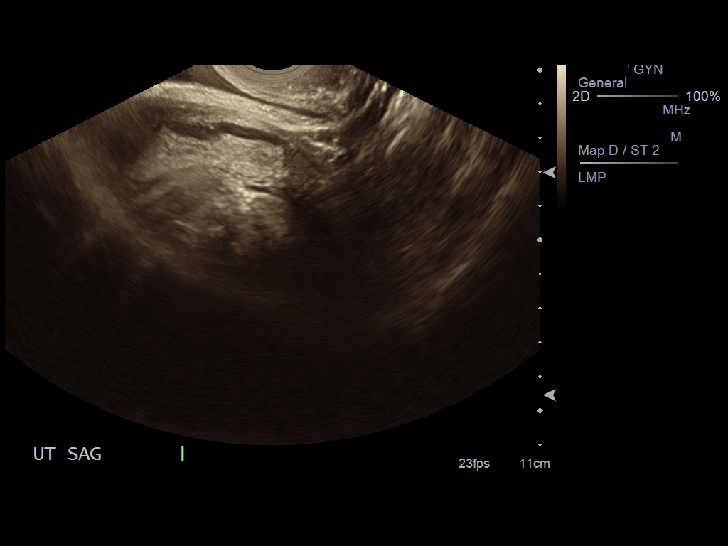
[im 71/85]
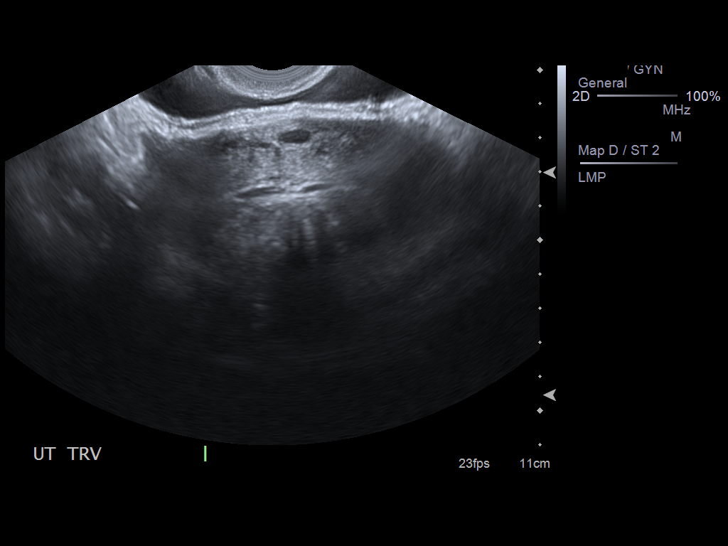
[im 78/85]
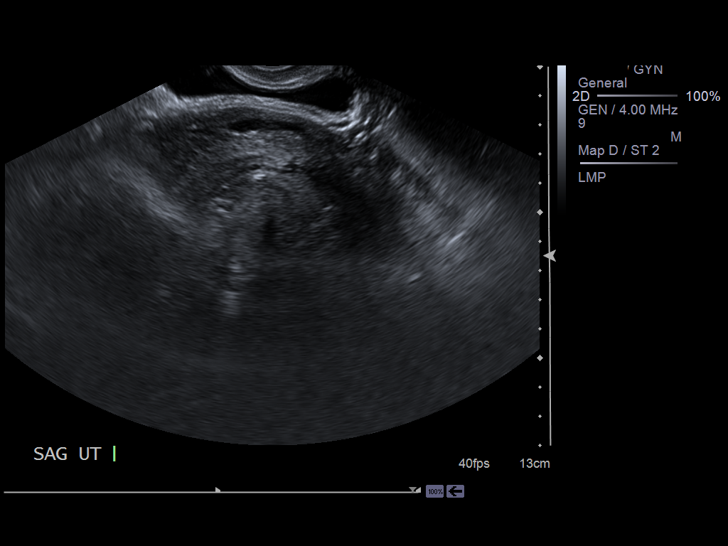
[im 85/85]
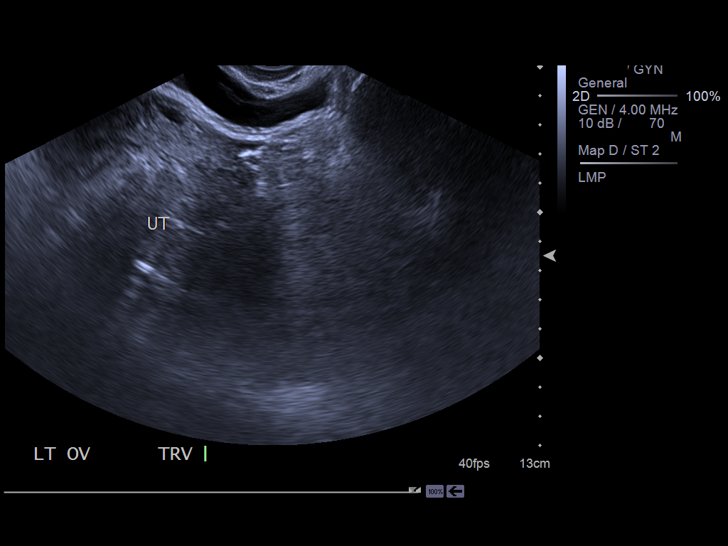

[13 of 25 positions shown; findings below may reference images not displayed]

It was necessary to proceed with endovaginal exam following the
transabdominal exam to visualize the endometrial complex.
FINDINGS: Uterus: 18.4 cm length by 13.3 cm [facility] by 15.6 cm transverse.  Two
uterine leiomyomata identified.  Largest is at the mid uterine
segment, 8.4 x 9.4 x 11.1 cm in size, posteriorly, extending from
subserosal to submucosal and distorting the endometrial complex.  A
second small leiomyoma is seen at the anterior aspect of the upper
uterine segment, 4.6 x 4.2 x 4.6 cm.

Endometrium: Inadequately visualized to measure thickness on both
transabdominal and endovaginal imaging.  No gross evidence of
endometrial fluid.

Right ovary:  Seen only on transabdominal imaging.  Measures 6.7 x
4.4 x 4.7 cm.  A mildly complex follicle cyst is identified 3.9 x
4.8 x 2.9 cm, question resolving hemorrhagic cyst.  A second cystic
area is identified 2.7 x 3.3 x 2.5 cm, simple in character.

Left ovary: Seen only on the transabdominal imaging.  Measures
x 1.7 x 3.0 cm.  No masses identified.

Other findings: No free fluid
IMPRESSION: Enlarged uterus containing two leiomyomata, largest measuring
cm in size.
Two cysts within right ovary, largest 4.8 cm, question hemorrhagic
cyst.

## 2014-01-18 ENCOUNTER — Encounter (INDEPENDENT_AMBULATORY_CARE_PROVIDER_SITE_OTHER): Payer: Self-pay

## 2014-01-18 ENCOUNTER — Encounter: Payer: Self-pay | Admitting: Obstetrics and Gynecology

## 2014-01-18 ENCOUNTER — Ambulatory Visit (INDEPENDENT_AMBULATORY_CARE_PROVIDER_SITE_OTHER): Payer: BC Managed Care – PPO | Admitting: Obstetrics and Gynecology

## 2014-01-18 ENCOUNTER — Other Ambulatory Visit: Payer: Self-pay | Admitting: Obstetrics and Gynecology

## 2014-01-18 VITALS — BP 140/90 | Ht 70.0 in | Wt 246.6 lb

## 2014-01-18 DIAGNOSIS — N949 Unspecified condition associated with female genital organs and menstrual cycle: Secondary | ICD-10-CM

## 2014-01-18 DIAGNOSIS — R109 Unspecified abdominal pain: Secondary | ICD-10-CM | POA: Insufficient documentation

## 2014-01-18 NOTE — Progress Notes (Signed)
This chart was scribed by Ludger Nutting, Medical Scribe, for Dr. Mallory Shirk on 01/18/14 at 3:32 PM. This chart was reviewed by Dr. Mallory Shirk and is accurate.  [facility] Visit  Patient name: Lauren Banks MRN [id number]  Date of birth: 05-27-1966  CC & HPI:  Lauren Banks is a 48 y.o. female presenting today for constant, gradually worsening RLQ pain that has been ongoing for the past 7-8 months. She reports occasional radiation of pain to the right lower back. She was last seen for this pain in July 2014. She was seen by Dr. Elonda Husky and was given injections without relief. She is s/p hysterectomy for uterine fibroids.   She is sexually active without any pain.   ROS:  Mid back muscle spasms  Pertinent History Reviewed:  Medical & Surgical Hx:  Reviewed: Significant for   Past Medical History  Diagnosis Date  . Anemia     Past Surgical History  Procedure Laterality Date  . Abdominal hysterectomy  10/13/2011    Procedure: HYSTERECTOMY ABDOMINAL;  Surgeon: Jonnie Kind, MD;  Location: [facility];  Service: Gynecology;  Laterality: N/A;    Medications: Reviewed & Updated - see associated section Social History: Reviewed -  reports that she has been smoking Cigarettes.  She has been smoking about 1.00 pack per day. She has never used smokeless tobacco.  Objective Findings:  Vitals: BP 140/90  Ht '5\' 10"'  (1.778 m)  Wt 246 lb 9.6 oz (111.857 kg)  BMI 35.38 kg/m2  LMP 09/21/2011  Physical Examination: General appearance - alert, well appearing, and in no distress and oriented to person, place, and time Abdomen - soft, nontender, nondistended, no masses or organomegaly Pelvic - Examination chaperoned VULVA: normal appearing vulva with no masses, tenderness or lesions,  VAGINA: normal appearing vagina with normal color and discharge, no lesions, well supported, non tender, no appreciable masses.  CERVIX: surgically absent,  UTERUS: surgically absent, vaginal cuff well healed,   ADNEXA: normal adnexa in size, nontender and no masses  Assessment & Plan:   A: 1. Chronic RLQ pain, suspect musculoskeletal  2.  MUSCLE DISCOMFORT AT SHOULDERBLADES, LIKELY 2 TO LARGE BREAST AND TRACTION ON SHOULDERS. P: 1. Pelvic u/s at 10 AM 2. CBC, BMET, ESR

## 2014-01-18 NOTE — Patient Instructions (Signed)
U/S IN A.M.

## 2014-01-19 ENCOUNTER — Ambulatory Visit (INDEPENDENT_AMBULATORY_CARE_PROVIDER_SITE_OTHER): Payer: BC Managed Care – PPO | Admitting: Obstetrics and Gynecology

## 2014-01-19 ENCOUNTER — Encounter: Payer: Self-pay | Admitting: Obstetrics and Gynecology

## 2014-01-19 ENCOUNTER — Ambulatory Visit (INDEPENDENT_AMBULATORY_CARE_PROVIDER_SITE_OTHER): Payer: BC Managed Care – PPO

## 2014-01-19 VITALS — BP 118/80 | Ht 70.0 in | Wt 246.0 lb

## 2014-01-19 DIAGNOSIS — N83201 Unspecified ovarian cyst, right side: Secondary | ICD-10-CM

## 2014-01-19 DIAGNOSIS — N83202 Unspecified ovarian cyst, left side: Secondary | ICD-10-CM

## 2014-01-19 DIAGNOSIS — N83209 Unspecified ovarian cyst, unspecified side: Secondary | ICD-10-CM

## 2014-01-19 DIAGNOSIS — N949 Unspecified condition associated with female genital organs and menstrual cycle: Secondary | ICD-10-CM

## 2014-01-19 LAB — BASIC METABOLIC PANEL
BUN: 8 mg/dL (ref 6–23)
CALCIUM: 9.7 mg/dL (ref 8.4–10.5)
CO2: 24 mEq/L (ref 19–32)
CREATININE: 0.71 mg/dL (ref 0.50–1.10)
Chloride: 100 mEq/L (ref 96–112)
Glucose, Bld: 101 mg/dL — ABNORMAL HIGH (ref 70–99)
Potassium: 3.8 mEq/L (ref 3.5–5.3)
Sodium: 136 mEq/L (ref 135–145)

## 2014-01-19 LAB — SEDIMENTATION RATE: SED RATE: 4 mm/h (ref 0–22)

## 2014-01-19 LAB — CBC
HCT: 42.8 % (ref 36.0–46.0)
Hemoglobin: 14.4 g/dL (ref 12.0–15.0)
MCH: 27.1 pg (ref 26.0–34.0)
MCHC: 33.6 g/dL (ref 30.0–36.0)
MCV: 80.5 fL (ref 78.0–100.0)
PLATELETS: 329 10*3/uL (ref 150–400)
RBC: 5.32 MIL/uL — ABNORMAL HIGH (ref 3.87–5.11)
RDW: 14.9 % (ref 11.5–15.5)
WBC: 11.9 10*3/uL — AB (ref 4.0–10.5)

## 2014-01-19 MED ORDER — ESTRADIOL 1 MG PO TABS: 1.0000 mg | ORAL_TABLET | Freq: Every day | ORAL | Status: DC

## 2014-01-19 MED ORDER — HYDROCODONE-ACETAMINOPHEN 5-325 MG PO TABS
1.0000 | ORAL_TABLET | Freq: Four times a day (QID) | ORAL | Status: DC | PRN
Start: 2014-01-19 — End: 2014-03-16

## 2014-01-19 NOTE — Patient Instructions (Addendum)
Ovarian Cyst An ovarian cyst is a sac filled with fluid or blood. This sac is attached to the ovary. Some cysts go away on their own. Other cysts need treatment.  HOME CARE   Only take medicine as told by your doctor.  Follow up with your doctor as told.  Get regular pelvic exams and Pap tests. GET HELP IF:  Your periods are late, not regular, or painful.  You stop having periods.  Your belly (abdominal) or pelvic pain does not go away.  Your belly becomes large or puffy (swollen).  You have a hard time peeing (totally emptying your bladder).  You have pressure on your bladder.  You have pain during sex.  You feel fullness, pressure, or discomfort in your belly.  You lose weight for no reason.  You feel sick most of the time.  You have a hard time pooping (constipation).  You do not feel like eating.  You develop pimples (acne).  You have an increase in hair on your body and face.  You are gaining weight for no reason.  You think you are pregnant. GET HELP RIGHT AWAY IF:   Your belly pain gets worse.  You feel sick to your stomach (nauseous), and you throw up (vomit).  You have a fever that comes on fast.  You have belly pain while pooping (bowel movement).  Your periods are heavier than usual. MAKE SURE YOU:   Understand these instructions.  Will watch your condition.  Will get help right away if you are not doing well or get worse. Document Released: 04/06/2008 Document Revised: 08/09/2013 Document Reviewed: 06/26/2013 [facility] Patient Information 2014 [place].  Estradiol What is this medicine? ESTRADIOL (es tra DYE ole) is an estrogen. It is mostly used as hormone replacement in menopausal women. It helps to treat hot flashes and prevent osteoporosis. It is also used to treat women with low estrogen levels or those who have had their ovaries removed. This medicine may be used for other purposes; ask your health care provider or pharmacist  if you have questions. COMMON BRAND NAME(S): Estrace, Femtrace, Gynodiol  What should I tell my health care provider before I take this medicine? They need to know if you have or ever had any of these conditions: -abnormal vaginal bleeding -blood vessel disease or blood clots -breast, cervical, endometrial, ovarian, liver, or uterine cancer -dementia -diabetes -gallbladder disease -heart disease or recent heart attack -high blood pressure -high cholesterol -high level of calcium in the blood -hysterectomy -kidney disease -liver disease -migraine headaches -protein C deficiency -protein S deficiency -stroke -systemic lupus erythematosus (SLE) -tobacco smoker -an unusual or allergic reaction to estrogens, other hormones, medicines, foods, dyes, or preservatives -pregnant or trying to get pregnant -breast-feeding How should I use this medicine? Take this medicine by mouth. To reduce nausea, this medicine may be taken with food. Follow the directions on the prescription label. Take this medicine at the same time each day and in the order directed on the package. Do not take your medicine more often than directed. Contact your pediatrician regarding the use of this medicine in children. Special care may be needed. A patient package insert for the product will be given with each prescription and refill. Read this sheet carefully each time. The sheet may change frequently. Overdosage: If you think you have taken too much of this medicine contact a poison control center or emergency room at once. NOTE: This medicine is only for you. Do not share this medicine  with others. What if I miss a dose? If you miss a dose, take it as soon as you can. If it is almost time for your next dose, take only that dose. Do not take double or extra doses. What may interact with this medicine? Do not take this medicine with any of the following medications: -aromatase inhibitors like aminoglutethimide,  anastrozole, exemestane, letrozole, testolactone This medicine may also interact with the following medications: -carbamazepine -certain antibiotics used to treat infections -certain barbiturates or benzodiazepines used for inducing sleep or treating seizures -grapefruit juice -medicines for fungus infections like itraconazole and ketoconazole -raloxifene or tamoxifen -rifabutin, rifampin, or rifapentine -ritonavir -St. Deardra Hinkley's Wort -warfarin This list may not describe all possible interactions. Give your health care provider a list of all the medicines, herbs, non-prescription drugs, or dietary supplements you use. Also tell them if you smoke, drink alcohol, or use illegal drugs. Some items may interact with your medicine. What should I watch for while using this medicine? Visit your doctor or health care professional for regular checks on your progress. You will need a regular breast and pelvic exam and Pap smear while on this medicine. You should also discuss the need for regular mammograms with your health care professional, and follow his or her guidelines for these tests. This medicine can make your body retain fluid, making your fingers, hands, or ankles swell. Your blood pressure can go up. Contact your doctor or health care professional if you feel you are retaining fluid. If you have any reason to think you are pregnant, stop taking this medicine right away and contact your doctor or health care professional. Smoking increases the risk of getting a blood clot or having a stroke while you are taking this medicine, especially if you are more than 49 years old. You are strongly advised not to smoke. If you wear contact lenses and notice visual changes, or if the lenses begin to feel uncomfortable, consult your eye doctor or health care professional. This medicine can increase the risk of developing a condition (endometrial hyperplasia) that may lead to cancer of the lining of the uterus.  Taking progestins, another hormone drug, with this medicine lowers the risk of developing this condition. Therefore, if your uterus has not been removed (by a hysterectomy), your doctor may prescribe a progestin for you to take together with your estrogen. You should know, however, that taking estrogens with progestins may have additional health risks. You should discuss the use of estrogens and progestins with your health care professional to determine the benefits and risks for you. If you are going to have surgery, you may need to stop taking this medicine. Consult your health care professional for advice before you schedule the surgery. What side effects may I notice from receiving this medicine? Side effects that you should report to your doctor or health care professional as soon as possible: -allergic reactions like skin rash, itching or hives, swelling of the face, lips, or tongue -breast tissue changes or discharge -changes in vision -chest pain -confusion, trouble speaking or understanding -dark urine -general ill feeling or flu-like symptoms -light-colored stools -nausea, vomiting -pain, swelling, warmth in the leg -right upper belly pain -severe headaches -shortness of breath -sudden numbness or weakness of the face, arm or leg -trouble walking, dizziness, loss of balance or coordination -unusual vaginal bleeding -yellowing of the eyes or skin Side effects that usually do not require medical attention (report to your doctor or health care professional if they continue or  are bothersome): -hair loss -increased hunger or thirst -increased urination -symptoms of vaginal infection like itching, irritation or unusual discharge -unusually weak or tired This list may not describe all possible side effects. Call your doctor for medical advice about side effects. You may report side effects to FDA at 1-800-FDA-1088. Where should I keep my medicine? Keep out of the reach of  children. Store at room temperature between 20 and 25 degrees C (68 and 77 degrees F). Keep container tightly closed. Protect from light. Throw away any unused medicine after the expiration date. NOTE: This sheet is a summary. It may not cover all possible information. If you have questions about this medicine, talk to your doctor, pharmacist, or health care provider.  2014, [employer]vier/[employer] Standard. (2011-01-21 75:44:92)

## 2014-01-19 NOTE — Progress Notes (Signed)
This chart was scribed by Jenne Campus, Medical Scribe, for Dr. Mallory Shirk on 01/19/14 at 10:52 AM. This chart was reviewed by Dr. Mallory Shirk and is accurate.    [facility] Visit  Patient name: Lauren Banks MRN [id number]  Date of birth: [birth year]  CC & HPI:  Lauren Banks is a 48 y.o. female presenting today for RLQ abdominal pain since her total abdominal hysterectomy in 2012. No LLQ pain. Had vaginal U/S today. Denies pain with probe. Was on birth control pills from age or 67 to 1. When she stopped, she had a 4 month menses.   U/S done on 01/19/14-Uterus-surgically absent, Vag cuff-appears wnl, Rt ovary appears WNL, Lt ovary with 2 complex cystic masses noted within internal debris and no +Doppler noted within, small amount of free fluid noted within Lt adnexa  ROS:  +RLQ pain No other complaints  Pertinent History Reviewed:  Medical & Surgical Hx:  Reviewed: Significant for total hysterectomy in 2012 Medications: Reviewed & Updated - see associated section Social History: Reviewed -  reports that she has been smoking Cigarettes.  She has been smoking about 1.00 pack per day. She has never used smokeless tobacco.  Objective Findings:  Vitals: BP 118/80  Ht 5\' 10"  (1.778 m)  Wt 246 lb (111.585 kg)  BMI 35.30 kg/m2  LMP 09/21/2011  Physical Examination: Not done   Assessment & Plan:  A: 1. Referred ovarian cyst pain versus abdominal wall pain 2. Left ovarian cyst 3. Laparoscopy discussed   P: 1. Repeat U/S in 6 to 8 weeks 2. Estrace 1 mg for left ovarian cyst 3. followup 8 wk to discuss pain, repeat u/s and consider laparoscopy

## 2014-01-25 ENCOUNTER — Ambulatory Visit: Payer: BC Managed Care – PPO | Admitting: Obstetrics and Gynecology

## 2014-03-15 ENCOUNTER — Ambulatory Visit: Payer: BC Managed Care – PPO | Admitting: Obstetrics and Gynecology

## 2014-03-16 ENCOUNTER — Ambulatory Visit (INDEPENDENT_AMBULATORY_CARE_PROVIDER_SITE_OTHER): Payer: BC Managed Care – PPO | Admitting: Obstetrics and Gynecology

## 2014-03-16 ENCOUNTER — Encounter: Payer: Self-pay | Admitting: Obstetrics and Gynecology

## 2014-03-16 VITALS — BP 120/80 | Ht 70.0 in | Wt 243.0 lb

## 2014-03-16 DIAGNOSIS — N83209 Unspecified ovarian cyst, unspecified side: Secondary | ICD-10-CM

## 2014-03-16 DIAGNOSIS — N83202 Unspecified ovarian cyst, left side: Secondary | ICD-10-CM

## 2014-03-16 DIAGNOSIS — R1031 Right lower quadrant pain: Secondary | ICD-10-CM

## 2014-03-16 MED ORDER — HYDROCODONE-ACETAMINOPHEN 10-325 MG PO TABS: 1.0000 | ORAL_TABLET | Freq: Four times a day (QID) | ORAL | Status: DC | PRN

## 2014-03-16 NOTE — Progress Notes (Signed)
This chart was scribed by Jenne Campus, Medical Scribe, for Dr. Mallory Shirk on 03/16/14 at 8:56 AM. This chart was reviewed by Dr. Mallory Shirk for accuracy.   [facility] Visit  Patient name: Lauren Banks MRN [id number]  Date of birth: 07-Dec-1965  CC & HPI:  Lauren Banks is a 48 y.o. female presenting today for a f/u for left ovarian cysts. Pt here for a repeat U/S and discussion of pain. Was prescribed Norco and Estrace 1 mg. States that pain was improved until she ran out. Has been using "regular" BCs and "arthritis" BCs since then with improvement. States left ovary causes pain in the RLQ instead.   U/S done on 01/19/14: Technician Comments:  Uterus-surgically absent, Vag cuff-appears wnl, Rt ovary appears WNL, Lt ovary with 2 complex cystic masses noted within internal debris and no +Doppler noted within, small amount of free fluid noted within Lt adnexa  ROS:  +RLQ pain +painful nipples due to Estrace No other complaints   Pertinent History Reviewed:  Medical & Surgical Hx:  Reviewed: Significant for partial hysterectomy in 2012 Medications: Reviewed & Updated - see associated section Social History: Reviewed -  reports that she has been smoking Cigarettes.  She has a 30 pack-year smoking history. She has never used smokeless tobacco.  Objective Findings:  Vitals: BP 120/80  Ht 5\' 10"  (1.778 m)  Wt 243 lb (110.224 kg)  BMI 34.87 kg/m2  LMP 09/21/2011 Chaperone present for exam which was performed with pt's permission Physical Examination: General appearance - alert, well appearing, and in no distress and oriented to person, place, and time Mental status - alert, oriented to person, place, and time, normal mood, behavior, speech, dress, motor activity, and thought processes Pelvic - VULVA: perianal has an anterior skin tag that is white and discolored thought to be vitiligo with a small sebaceous cyst to the right of it, does not appear to be cancerous or  precancerous, VAGINA: normal appearing vagina with normal color and discharge, no lesions, CERVIX: surgically absent, UTERUS: surgically absent, ADNEXA: 2.2 cm by 2.1 cm by 3.7 cm simple cystitic structure within the left ovary, left ovary is 5.5 cm in size noted on transvaginal U/S, pt reports that U/S probe causes pain in the RLQ   Assessment & Plan:  A: 1. Unsuccessful ovary suppression with Estrace  2. RLQ pain 3. Left ovarian cyst  P: 1. Options: pt to try increased pain med doses for now, then if that doesn't work, consider laparoscopy, with removal of cyst and LOA,  (also salpingectomy). 2. Laparoscopic left salping-oophorectomy if pain management fails 3. Pt wishes to avoid surgical removal of both ovaries if at all possible.

## 2015-02-24 IMAGING — CT CT ABD-PELV W/ CM
2 of 4 series · 17 of 46 positions shown, 19 images · IV contrast (Omnipaque 300)
Comparison: None.

CLINICAL DATA: Right lower quadrant pain.  History of hysterectomy.

CT ABDOMEN AND PELVIS WITH CONTRAST
TECHNIQUE: Multidetector CT imaging of the abdomen and pelvis was
performed following the standard protocol during bolus
administration of intravenous contrast.
Contrast: 50mL OMNIPAQUE IOHEXOL 300 MG/ML  SOLN, 100mL OMNIPAQUE
IOHEXOL 300 MG/ML  SOLN

[Series 2: abd_pel_with 5.0 b40f · axial · 0.71mm/px · z∈[-511,-36]mm · 14 of 105 slices shown, 16 images]
[im 5/105  soft-tissue]
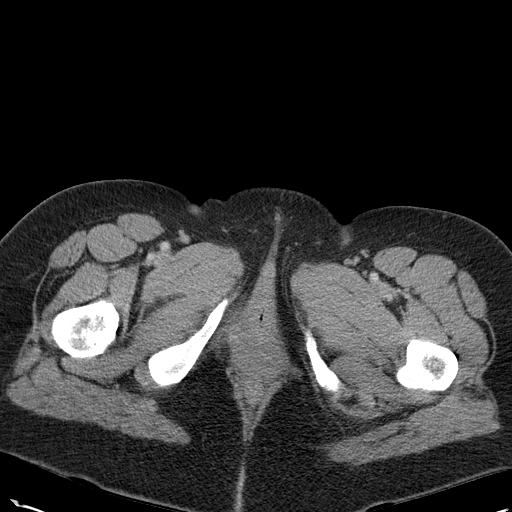
[im 5/105  bone]
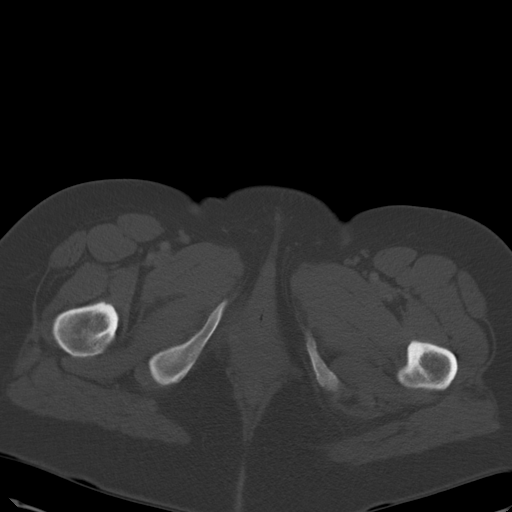
[im 15/105  soft-tissue]
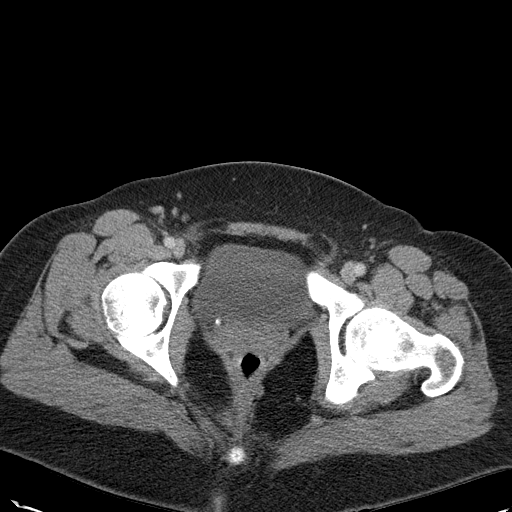
[im 20/105  soft-tissue]
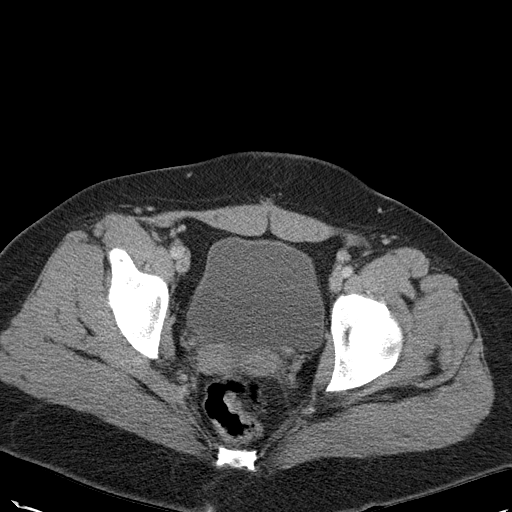
[im 30/105  soft-tissue]
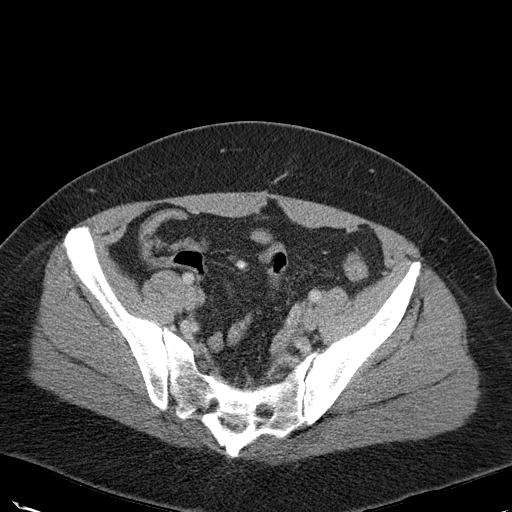
[im 35/105  soft-tissue]
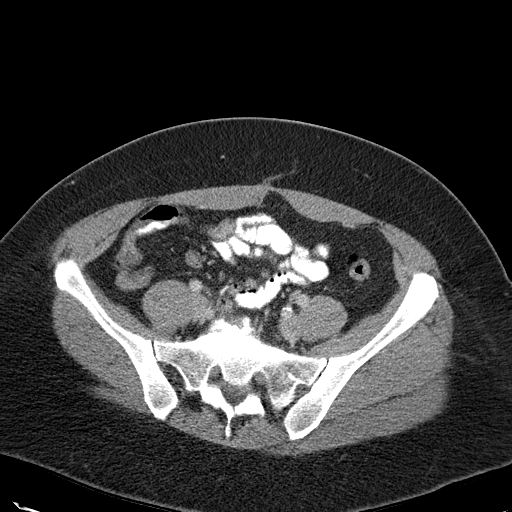
[im 40/105  soft-tissue]
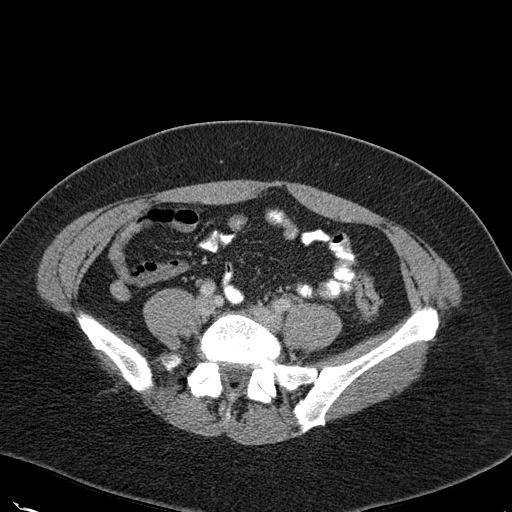
[im 50/105  soft-tissue]
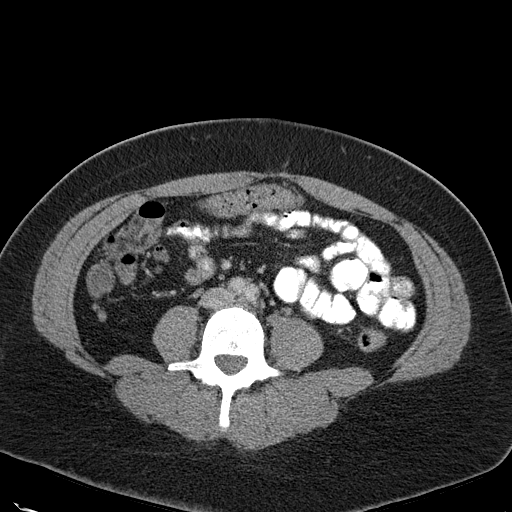
[im 55/105  soft-tissue]
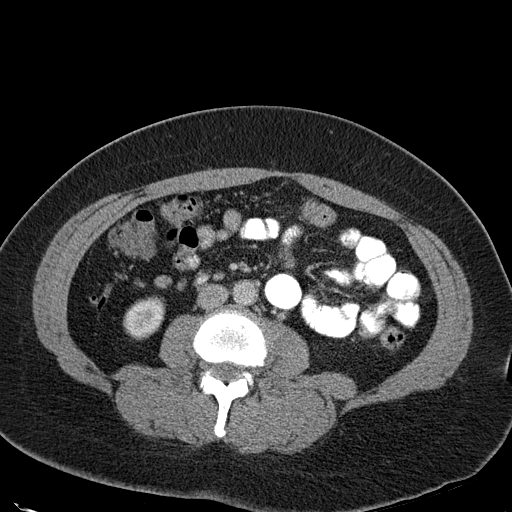
[im 65/105  soft-tissue]
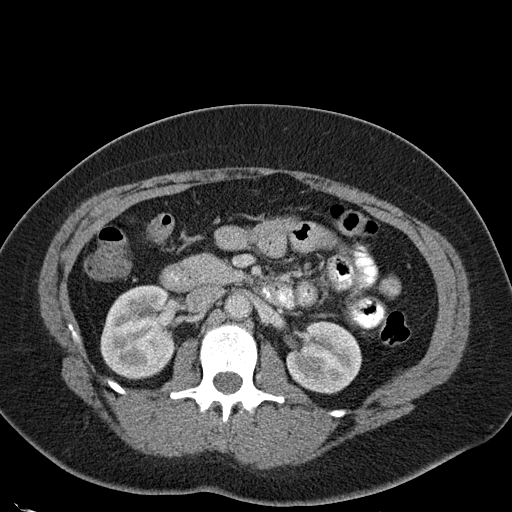
[im 65/105  bone]
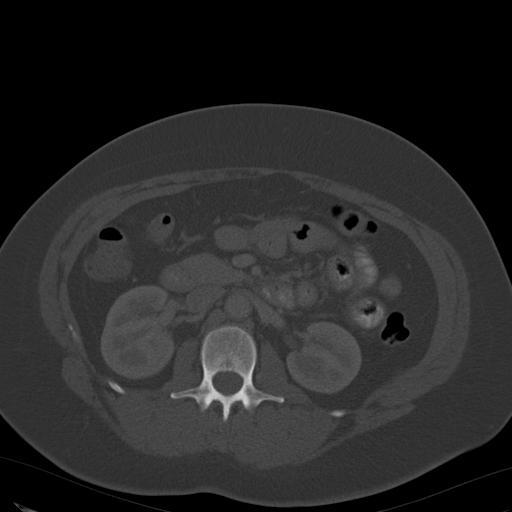
[im 70/105  soft-tissue]
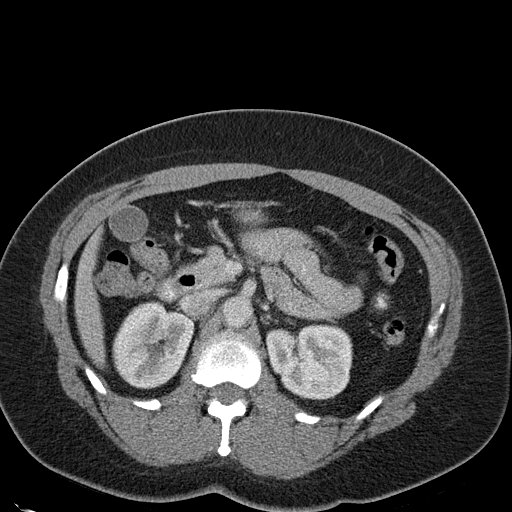
[im 80/105  soft-tissue]
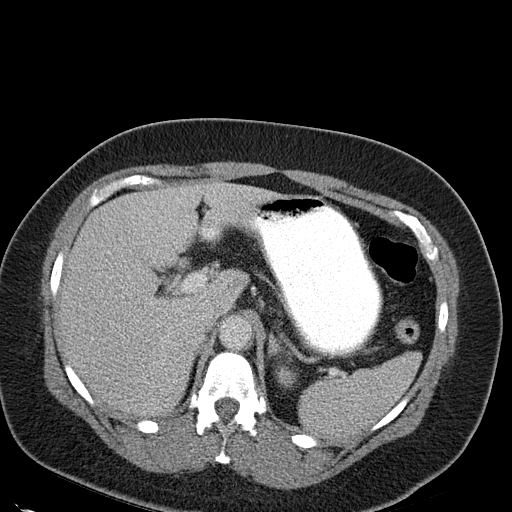
[im 85/105  soft-tissue]
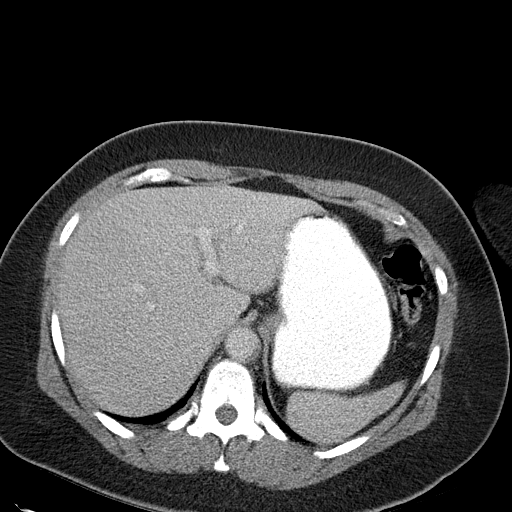
[im 90/105  soft-tissue]
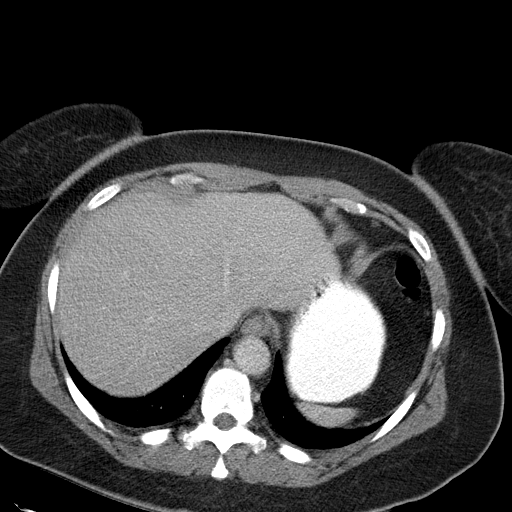
[im 100/105  soft-tissue]
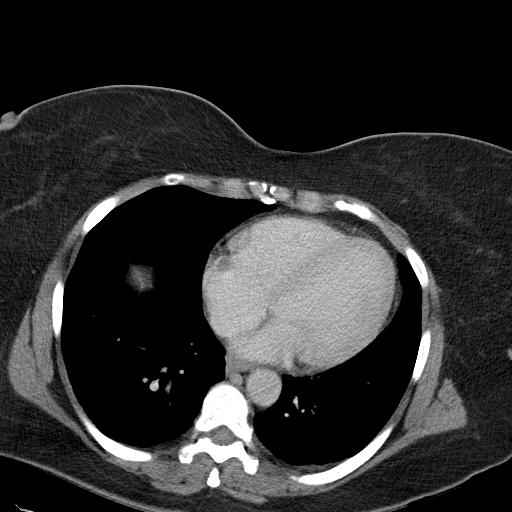

[Series 4: abd_pel_with 3.0 spo · coronal · 1.01mm/px · 3 of 94 slices shown]
[im 32/94  soft-tissue]
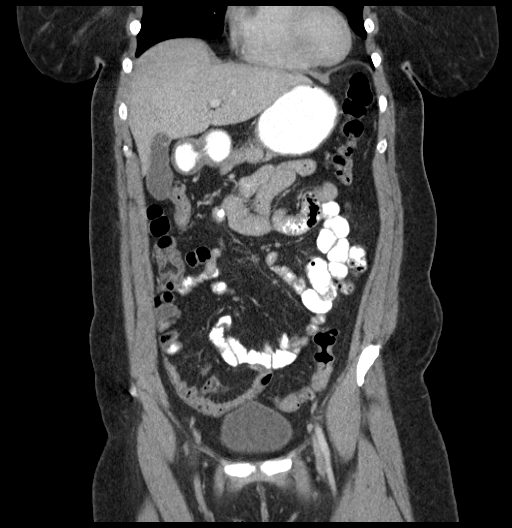
[im 42/94  soft-tissue]
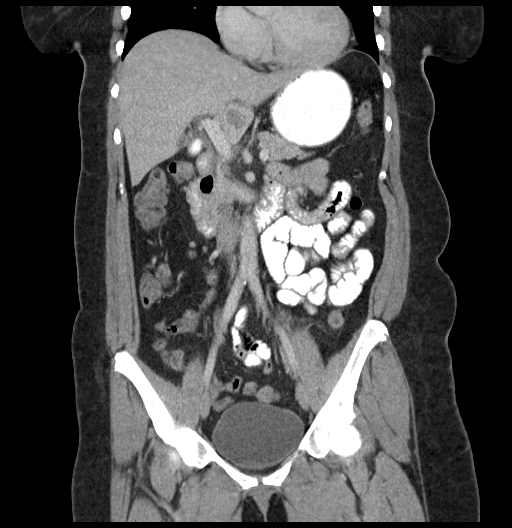
[im 52/94  soft-tissue]
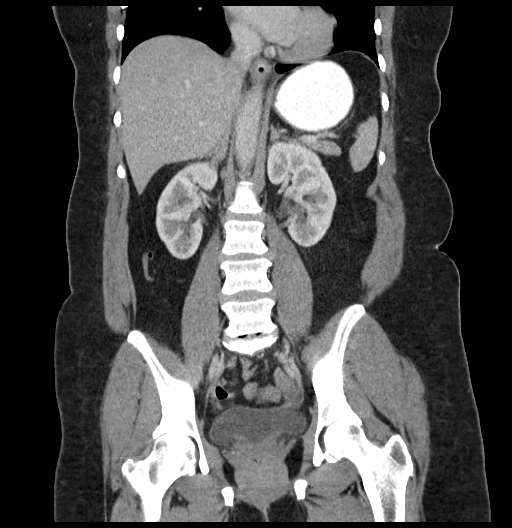

[17 of 46 positions shown; findings below may reference images not displayed]

FINDINGS: There is bibasilar atelectasis.  No evidence for
pneumoperitoneum.  Along the right side of the heart, there is a
round density along the periphery of the right atrium that is
probably associated with normal cardiac structures.  This is seen
on images number 5 and 6.

There is a mildly hypodense structure in the caudate lobe of the
liver.  The structure roughly measures 1.9 cm and is indeterminate.
There is subtle low density structure along the dome of the liver
on image number 11.

Normal appearance of the spleen, pancreas, adrenal glands and both
kidneys.  No significant free fluid or lymphadenopathy.  Uterus has
been removed.  A peripherally dense structure in the left adnexa
measures 1.8 cm and may be related to an involuting cyst.  Normal
appearance of the appendix.  Prominent lymph nodes in the ileocecal
mesentery are nonspecific.  There is fluid in the urinary bladder.

[Severe disc space loss at L5-S1.  Evidence for 1.1 cm cyst in the
right kidney lower pole on the delayed images.]
IMPRESSION: No acute abnormality within the abdomen or pelvis.

1.9 cm indeterminate structure within the caudate of the liver.
Findings are not suggestive for a simple cyst.  This could
represent a cavernous hemangioma but nonspecific on this
examination.  This could be further characterized with MRI.

## 2015-12-25 ENCOUNTER — Ambulatory Visit: Payer: Managed Care, Other (non HMO) | Admitting: Oncology

## 2016-01-01 ENCOUNTER — Encounter: Payer: Self-pay | Admitting: Oncology

## 2016-01-01 ENCOUNTER — Inpatient Hospital Stay: Payer: BLUE CROSS/BLUE SHIELD

## 2016-01-01 ENCOUNTER — Inpatient Hospital Stay: Payer: BLUE CROSS/BLUE SHIELD | Attending: Oncology | Admitting: Oncology

## 2016-01-01 VITALS — BP 145/91 | HR 82 | Temp 98.7°F | Resp 18 | Wt 253.3 lb

## 2016-01-01 DIAGNOSIS — I10 Essential (primary) hypertension: Secondary | ICD-10-CM

## 2016-01-01 DIAGNOSIS — Z8041 Family history of malignant neoplasm of ovary: Secondary | ICD-10-CM | POA: Insufficient documentation

## 2016-01-01 DIAGNOSIS — F17210 Nicotine dependence, cigarettes, uncomplicated: Secondary | ICD-10-CM

## 2016-01-01 DIAGNOSIS — D72829 Elevated white blood cell count, unspecified: Secondary | ICD-10-CM

## 2016-01-01 DIAGNOSIS — Z79899 Other long term (current) drug therapy: Secondary | ICD-10-CM | POA: Insufficient documentation

## 2016-01-01 DIAGNOSIS — Z809 Family history of malignant neoplasm, unspecified: Secondary | ICD-10-CM | POA: Diagnosis not present

## 2016-01-01 LAB — CBC WITH DIFFERENTIAL/PLATELET
Basophils Absolute: 0.1 10*3/uL (ref 0–0.1)
Basophils Relative: 1 %
Eosinophils Absolute: 0.6 10*3/uL (ref 0–0.7)
Eosinophils Relative: 5 %
HEMATOCRIT: 43.9 % (ref 35.0–47.0)
Hemoglobin: 14.5 g/dL (ref 12.0–16.0)
LYMPHS PCT: 30 %
Lymphs Abs: 3.7 10*3/uL — ABNORMAL HIGH (ref 1.0–3.6)
MCH: 27.4 pg (ref 26.0–34.0)
MCHC: 33.1 g/dL (ref 32.0–36.0)
MCV: 82.6 fL (ref 80.0–100.0)
MONO ABS: 0.7 10*3/uL (ref 0.2–0.9)
MONOS PCT: 6 %
NEUTROS ABS: 7.1 10*3/uL — AB (ref 1.4–6.5)
Neutrophils Relative %: 58 %
Platelets: 288 10*3/uL (ref 150–440)
RBC: 5.31 MIL/uL — ABNORMAL HIGH (ref 3.80–5.20)
RDW: 13.7 % (ref 11.5–14.5)
WBC: 12.2 10*3/uL — ABNORMAL HIGH (ref 3.6–11.0)

## 2016-01-01 LAB — FOLATE: FOLATE: 11.5 ng/mL (ref >5.9)

## 2016-01-01 LAB — VITAMIN B12: VITAMIN B 12: 296 pg/mL (ref 180–914)

## 2016-01-01 LAB — LACTATE DEHYDROGENASE: LDH: 112 U/L (ref 98–192)

## 2016-01-01 NOTE — Progress Notes (Signed)
Patient's WBC was abnormal on recent lab check at PCP.

## 2016-01-10 LAB — COMP PANEL: LEUKEMIA/LYMPHOMA

## 2016-01-10 LAB — BCR-ABL1 KINASE DOMAIN MUTATION ANALYSIS

## 2016-01-12 NOTE — Progress Notes (Signed)
[place]  [phone] [phone]) [phone]  ID: Stephens November OB: 09-14-1966  MR#: [id number]  HYW#:[id number]  Patient Care Team: Dorna Mai, MD as PCP - General (Family Medicine)  CHIEF COMPLAINT:  Chief Complaint  Patient presents with  . New Evaluation    abnormal labs    INTERVAL HISTORY: Patient is a 50 year old female who was found to have a mildly elevated white blood cell count on routine blood work. Repeat testing confirmed the results. She currently feels well and is asymptomatic. She denies any recent fevers or illnesses. She has a good appetite and denies weight loss. She denies any night sweats. She has no neurologic complaints. She has no chest pain or shortness of breath. She denies any nausea, vomiting, constipation, or diarrhea. She has no urinary complaints. Patient feels at her baseline and offers no specific complaints today.  REVIEW OF SYSTEMS:   Review of Systems  Constitutional: Negative.  Negative for fever, weight loss, malaise/fatigue and diaphoresis.  Respiratory: Negative.  Negative for sputum production.   Cardiovascular: Negative.  Negative for chest pain.  Gastrointestinal: Negative.   Genitourinary: Negative.   Musculoskeletal: Negative.   Neurological: Negative.  Negative for weakness.    As per HPI. Otherwise, a complete review of systems is negatve.  PAST MEDICAL HISTORY: Past Medical History  Diagnosis Date  . Hypertension     PAST SURGICAL HISTORY: Past Surgical History  Procedure Laterality Date  . Abdominal hysterectomy  10/13/2011    Procedure: HYSTERECTOMY ABDOMINAL;  Surgeon: Jonnie Kind, MD;  Location: [facility];  Service: Gynecology;  Laterality: N/A;    FAMILY HISTORY Family History  Problem Relation Age of Onset  . Anesthesia problems Neg Hx   . Cancer Mother     breast- around age 47, ovarian- around age50  . Hypertension Mother   . Alzheimer's disease Mother   . Hypertension Father    . Diabetes Father   . Kidney disease Father   . Gout Father   . Stroke Maternal Grandmother   . Kidney disease Maternal Grandmother   . Stroke Paternal Grandmother   . Diabetes Sister   . Obesity Sister   . Hypertension Brother   . Cancer Brother     lymphnode  . Gout Brother   . Cancer Maternal Aunt        ADVANCED DIRECTIVES:    HEALTH MAINTENANCE: Social History  Substance Use Topics  . Smoking status: Current Every Day Smoker -- 1.00 packs/day for 30 years    Types: Cigarettes  . Smokeless tobacco: Never Used  . Alcohol Use: No     Colonoscopy:  PAP:  Bone density:  Lipid panel:  Allergies  Allergen Reactions  . Penicillins Hives    Current Outpatient Prescriptions  Medication Sig Dispense Refill  . ERGOCALCIFEROL PO Take 1.25 mg by mouth 2 (two) times a week.    . levocetirizine (XYZAL) 5 MG tablet Take 5 mg by mouth every evening.    Marland Kitchen losartan (COZAAR) 100 MG tablet Take 100 mg by mouth daily.    . simvastatin (ZOCOR) 20 MG tablet Take 20 mg by mouth daily.    . hydrochlorothiazide (HYDRODIURIL) 25 MG tablet Take 1 tablet (25 mg total) by mouth daily. 30 tablet 6   No current facility-administered medications for this visit.    OBJECTIVE: Filed Vitals:   01/01/16 1137  BP: 145/91  Pulse: 82  Temp: 98.7 F (37.1 C)  Resp: 18     Body mass  index is 36.35 kg/(m^2).    ECOG FS:0 - Asymptomatic  General: Well-developed, well-nourished, no acute distress. Eyes: Pink conjunctiva, anicteric sclera. HEENT: Normocephalic, moist mucous membranes, clear oropharnyx. Lungs: Clear to auscultation bilaterally. Heart: Regular rate and rhythm. No rubs, murmurs, or gallops. Abdomen: Soft, nontender, nondistended. No organomegaly noted, normoactive bowel sounds. Musculoskeletal: No edema, cyanosis, or clubbing. Neuro: Alert, answering all questions appropriately. Cranial nerves grossly intact. Skin: No rashes or petechiae noted. Psych: Normal  affect. Lymphatics: No cervical, calvicular, axillary or inguinal LAD.   LAB RESULTS:  Lab Results  Component Value Date   NA 136 01/18/2014   K 3.8 01/18/2014   CL 100 01/18/2014   CO2 24 01/18/2014   GLUCOSE 101* 01/18/2014   BUN 8 01/18/2014   CREATININE 0.71 01/18/2014   CALCIUM 9.7 01/18/2014   PROT 7.0 05/14/2013   ALBUMIN 3.5 05/14/2013   AST 15 05/14/2013   ALT 8 05/14/2013   ALKPHOS 61 05/14/2013   BILITOT 0.4 05/14/2013   GFRNONAA >90 05/14/2013   GFRAA >90 05/14/2013    Lab Results  Component Value Date   WBC 12.2* 01/01/2016   NEUTROABS 7.1* 01/01/2016   HGB 14.5 01/01/2016   HCT 43.9 01/01/2016   MCV 82.6 01/01/2016   PLT 288 01/01/2016     STUDIES: No results found.  ASSESSMENT: Mild leukocytosis.  PLAN:    1. Leukocytosis: Patient's white blood cell count remains persistently elevated. Flow cytometry, BCR/ABL, and the remainder of her blood work are all either negative or within normal limits. No intervention is needed at this time. Patient does not require bone marrow biopsy. Return to clinic in 3 months with repeat laboratory work and further evaluation. 2. Hypertension: Blood pressure mildly elevated today, continue current medications as prescribed.  Patient expressed understanding and was in agreement with this plan. She also understands that She can call clinic at any time with any questions, concerns, or complaints.    Lloyd Huger, MD   01/12/2016 7:44 AM

## 2016-02-01 NOTE — Progress Notes (Deleted)
This encounter was created in error - please disregard.

## 2016-04-02 ENCOUNTER — Inpatient Hospital Stay: Payer: BLUE CROSS/BLUE SHIELD

## 2016-04-02 ENCOUNTER — Inpatient Hospital Stay: Payer: BLUE CROSS/BLUE SHIELD | Admitting: Oncology

## 2016-04-17 ENCOUNTER — Other Ambulatory Visit: Payer: Self-pay | Admitting: *Deleted

## 2016-04-17 DIAGNOSIS — D649 Anemia, unspecified: Secondary | ICD-10-CM

## 2016-04-21 ENCOUNTER — Inpatient Hospital Stay: Payer: BLUE CROSS/BLUE SHIELD

## 2016-04-21 ENCOUNTER — Inpatient Hospital Stay: Payer: BLUE CROSS/BLUE SHIELD | Attending: Oncology | Admitting: Oncology

## 2016-04-21 VITALS — BP 134/87 | HR 83 | Temp 97.9°F | Wt 256.8 lb

## 2016-04-21 DIAGNOSIS — F17210 Nicotine dependence, cigarettes, uncomplicated: Secondary | ICD-10-CM

## 2016-04-21 DIAGNOSIS — I10 Essential (primary) hypertension: Secondary | ICD-10-CM

## 2016-04-21 DIAGNOSIS — D72829 Elevated white blood cell count, unspecified: Secondary | ICD-10-CM | POA: Diagnosis present

## 2016-04-21 DIAGNOSIS — Z809 Family history of malignant neoplasm, unspecified: Secondary | ICD-10-CM | POA: Diagnosis not present

## 2016-04-21 DIAGNOSIS — Z79899 Other long term (current) drug therapy: Secondary | ICD-10-CM | POA: Insufficient documentation

## 2016-04-21 DIAGNOSIS — Z803 Family history of malignant neoplasm of breast: Secondary | ICD-10-CM | POA: Diagnosis not present

## 2016-04-21 NOTE — Progress Notes (Signed)
[place] [facility]  Telephone:([phone]) [phone] Fax:(336) [phone]  ID: Lauren Banks OB: [birth year]  MR#: [id number]  KTG#:[id number]  Patient Care Team: Dorna Mai, MD as PCP - General (Family Medicine)  CHIEF COMPLAINT:  Chief Complaint  Patient presents with  . Renal cell carcinoma, left    INTERVAL HISTORY: Patient returns to clinic for further evaluation and discussion of her laboratory results. She continues to feel well and is asymptomatic. She denies any recent fevers or illnesses. She has a good appetite and denies weight loss. She denies any night sweats. She has no neurologic complaints. She has no chest pain or shortness of breath. She denies any nausea, vomiting, constipation, or diarrhea. She has no urinary complaints. Patient feels at her baseline and offers no specific complaints today.  REVIEW OF SYSTEMS:   Review of Systems  Constitutional: Negative.  Negative for fever, weight loss, malaise/fatigue and diaphoresis.  Respiratory: Negative.  Negative for sputum production.   Cardiovascular: Negative.  Negative for chest pain.  Gastrointestinal: Negative.   Genitourinary: Negative.   Musculoskeletal: Negative.   Neurological: Negative.  Negative for weakness.    As per HPI. Otherwise, a complete review of systems is negatve.  PAST MEDICAL HISTORY: Past Medical History  Diagnosis Date  . Hypertension     PAST SURGICAL HISTORY: Past Surgical History  Procedure Laterality Date  . Abdominal hysterectomy  10/13/2011    Procedure: HYSTERECTOMY ABDOMINAL;  Surgeon: Jonnie Kind, MD;  Location: [facility];  Service: Gynecology;  Laterality: N/A;    FAMILY HISTORY Family History  Problem Relation Age of Onset  . Anesthesia problems Neg Hx   . Cancer Mother     breast- around age 5, ovarian- around age34  . Hypertension Mother   . Alzheimer's disease Mother   . Hypertension Father   . Diabetes Father   . Kidney disease Father   . Gout Father     . Stroke Maternal Grandmother   . Kidney disease Maternal Grandmother   . Stroke Paternal Grandmother   . Diabetes Sister   . Obesity Sister   . Hypertension Brother   . Cancer Brother     lymphnode  . Gout Brother   . Cancer Maternal Aunt        ADVANCED DIRECTIVES:    HEALTH MAINTENANCE: Social History  Substance Use Topics  . Smoking status: Current Every Day Smoker -- 1.00 packs/day for 30 years    Types: Cigarettes  . Smokeless tobacco: Never Used  . Alcohol Use: No     Colonoscopy:  PAP:  Bone density:  Lipid panel:  Allergies  Allergen Reactions  . Penicillins Hives    Current Outpatient Prescriptions  Medication Sig Dispense Refill  . hydrochlorothiazide (HYDRODIURIL) 25 MG tablet   0  . levocetirizine (XYZAL) 5 MG tablet Take 5 mg by mouth every evening.    Marland Kitchen losartan (COZAAR) 100 MG tablet Take 100 mg by mouth daily.    . simvastatin (ZOCOR) 20 MG tablet Take 20 mg by mouth daily.    . Vitamin D, Ergocalciferol, (DRISDOL) 50000 units CAPS capsule   0  . hydrochlorothiazide (HYDRODIURIL) 25 MG tablet Take 1 tablet (25 mg total) by mouth daily. 30 tablet 6   No current facility-administered medications for this visit.    OBJECTIVE: Filed Vitals:   04/21/16 1538  BP: 134/87  Pulse: 83  Temp: 97.9 F (36.6 C)     Body mass index is 36.85 kg/(m^2).    ECOG FS:0 -  Asymptomatic  General: Well-developed, well-nourished, no acute distress. Eyes: Pink conjunctiva, anicteric sclera. Lungs: Clear to auscultation bilaterally. Heart: Regular rate and rhythm. No rubs, murmurs, or gallops. Abdomen: Soft, nontender, nondistended. No organomegaly noted, normoactive bowel sounds. Musculoskeletal: No edema, cyanosis, or clubbing. Neuro: Alert, answering all questions appropriately. Cranial nerves grossly intact. Skin: No rashes or petechiae noted. Psych: Normal affect.  LAB RESULTS:  Lab Results  Component Value Date   NA 136 01/18/2014   K 3.8  01/18/2014   CL 100 01/18/2014   CO2 24 01/18/2014   GLUCOSE 101* 01/18/2014   BUN 8 01/18/2014   CREATININE 0.71 01/18/2014   CALCIUM 9.7 01/18/2014   PROT 7.0 05/14/2013   ALBUMIN 3.5 05/14/2013   AST 15 05/14/2013   ALT 8 05/14/2013   ALKPHOS 61 05/14/2013   BILITOT 0.4 05/14/2013   GFRNONAA >90 05/14/2013   GFRAA >90 05/14/2013    Lab Results  Component Value Date   WBC 12.2* 01/01/2016   NEUTROABS 7.1* 01/01/2016   HGB 14.5 01/01/2016   HCT 43.9 01/01/2016   MCV 82.6 01/01/2016   PLT 288 01/01/2016     STUDIES: No results found.  ASSESSMENT: Leukocytosis.  PLAN:    1. Leukocytosis: Patient's white blood cell count remains persistently elevated. Upon further review her records, it is unchanged since at least December 2012. Flow cytometry, BCR/ABL, and the remainder of her blood work are all either negative or within normal limits. No intervention is needed at this time. Patient does not require bone marrow biopsy. No further follow-up is scheduled. Continue to monitor patient's white blood cell count 1-2 times per year. If it trends up and remains elevated greater than 20.0, refer her back for further evaluation.  2. Hypertension: Blood pressure mildly elevated today, continue current medications as prescribed.  Patient expressed understanding and was in agreement with this plan. She also understands that She can call clinic at any time with any questions, concerns, or complaints.    Lloyd Huger, MD   04/21/2016 4:28 PM

## 2017-01-09 ENCOUNTER — Encounter (HOSPITAL_COMMUNITY): Payer: Self-pay | Admitting: Emergency Medicine

## 2017-01-09 ENCOUNTER — Emergency Department (HOSPITAL_COMMUNITY)
Admission: EM | Admit: 2017-01-09 | Discharge: 2017-01-09 | Disposition: A | Discharge status: Home / Self Care | Payer: BLUE CROSS/BLUE SHIELD | Attending: Emergency Medicine | Admitting: Emergency Medicine

## 2017-01-09 DIAGNOSIS — F17210 Nicotine dependence, cigarettes, uncomplicated: Secondary | ICD-10-CM | POA: Insufficient documentation

## 2017-01-09 DIAGNOSIS — Z79899 Other long term (current) drug therapy: Secondary | ICD-10-CM | POA: Insufficient documentation

## 2017-01-09 DIAGNOSIS — I10 Essential (primary) hypertension: Secondary | ICD-10-CM | POA: Insufficient documentation

## 2017-01-09 DIAGNOSIS — T782XXA Anaphylactic shock, unspecified, initial encounter: Secondary | ICD-10-CM | POA: Diagnosis not present

## 2017-01-09 DIAGNOSIS — R21 Rash and other nonspecific skin eruption: Secondary | ICD-10-CM | POA: Diagnosis present

## 2017-01-09 HISTORY — DX: Hyperlipidemia, unspecified: E78.5

## 2017-01-09 MED ORDER — METHYLPREDNISOLONE SODIUM SUCC 125 MG IJ SOLR
125.0000 mg | Freq: Once | INTRAMUSCULAR | Status: AC
  Administered 2017-01-09: 125 mg via INTRAVENOUS

## 2017-01-09 MED ORDER — EPINEPHRINE 0.3 MG/0.3ML IJ SOAJ: 0.3000 mg | Freq: Once | INTRAMUSCULAR | 0 refills | Status: AC

## 2017-01-09 MED ORDER — EPINEPHRINE 0.3 MG/0.3ML IJ SOAJ
0.3000 mg | Freq: Once | INTRAMUSCULAR | Status: AC
  Administered 2017-01-09: 0.3 mg via INTRAMUSCULAR
  Filled 2017-01-09: qty 0.3

## 2017-01-09 MED ORDER — FAMOTIDINE IN NACL 20-0.9 MG/50ML-% IV SOLN
20.0000 mg | Freq: Once | INTRAVENOUS | Status: AC
  Administered 2017-01-09: 20 mg via INTRAVENOUS

## 2017-01-09 MED ORDER — FAMOTIDINE 20 MG PO TABS: 20.0000 mg | ORAL_TABLET | Freq: Two times a day (BID) | ORAL | 0 refills | Status: DC

## 2017-01-09 MED ORDER — PREDNISONE 50 MG PO TABS: ORAL_TABLET | ORAL | 0 refills | Status: DC

## 2017-01-09 MED ORDER — METHYLPREDNISOLONE SODIUM SUCC 125 MG IJ SOLR
INTRAMUSCULAR | Status: AC
  Filled 2017-01-09: qty 2

## 2017-01-09 MED ORDER — SODIUM CHLORIDE 0.9 % IV BOLUS (SEPSIS)
1000.0000 mL | Freq: Once | INTRAVENOUS | Status: AC
  Administered 2017-01-09: 1000 mL via INTRAVENOUS

## 2017-01-09 MED ORDER — FAMOTIDINE IN NACL 20-0.9 MG/50ML-% IV SOLN
INTRAVENOUS | Status: AC
  Filled 2017-01-09: qty 50

## 2017-01-09 NOTE — ED Triage Notes (Signed)
Pt states she started having facial swelling with hives all over her body yesterday.  Has taken a total of 8 25mg  benadryl tablets since 1430 yesterday afternoon.  Last dose taken at 2230

## 2017-01-09 NOTE — ED Provider Notes (Signed)
[name] DEPT Provider Note   CSN: [id number] Arrival date & time: 01/09/17  0534     History   Chief Complaint Chief Complaint  Patient presents with  . Allergic Reaction    HPI Lauren Banks is a 51 y.o. female.  Reports allergic reaction starting at 3 PM yesterday. Started as a rash to her face and neck as well as chest. Progressed to involve her entire body associated with some throat tightness and lip swelling. Patient states she had a cobb salad for lunch about an hour and a half before symptoms started. Denies any other new medications. Her amlodipine dose was recently increased from 5 mg to 10 mg. No other new medications. No other new exposures, soaps, lotions, detergents or clothing. Patient states itching has progressed overnight that she's taken a total of 200 mg of Benadryl since yesterday afternoon. She denies any shortness of breath but feels that her voice is muffled and feels some tightness in her throat. Denies any chest pain, abdominal pain, vomiting or diarrhea. No one else with similar symptoms.  She is no longer on and ACEi but does take an ARB.   The history is provided by the patient and the spouse.  Allergic Reaction  Presenting symptoms: difficulty swallowing and rash     Past Medical History:  Diagnosis Date  . Hyperlipemia   . Hypertension     Patient Active Problem List   Diagnosis Date Noted  . Ovarian cyst, left 01/19/2014  . Abdominal pain in female patient 01/18/2014  . anemia, iron deficiency 08/31/2011    Class: Chronic    Past Surgical History:  Procedure Laterality Date  . ABDOMINAL HYSTERECTOMY  10/13/2011   Procedure: HYSTERECTOMY ABDOMINAL;  Surgeon: Jonnie Kind, MD;  Location: [facility];  Service: Gynecology;  Laterality: N/A;    OB History    No data available       Home Medications    Prior to Admission medications   Medication Sig Start Date End Date Taking? Authorizing Provider  hydrochlorothiazide  (HYDRODIURIL) 25 MG tablet Take 1 tablet (25 mg total) by mouth daily. 10/15/11 03/16/14  Jonnie Kind, MD  hydrochlorothiazide (HYDRODIURIL) 25 MG tablet  03/31/16   Historical Provider, MD  levocetirizine (XYZAL) 5 MG tablet Take 5 mg by mouth every evening.    Historical Provider, MD  losartan (COZAAR) 100 MG tablet Take 100 mg by mouth daily.    Historical Provider, MD  simvastatin (ZOCOR) 20 MG tablet Take 20 mg by mouth daily.    Historical Provider, MD  Vitamin D, Ergocalciferol, (DRISDOL) 50000 units CAPS capsule  02/15/16   Historical Provider, MD    Family History Family History  Problem Relation Age of Onset  . Cancer Mother     breast- around age 33, ovarian- around age35  . Hypertension Mother   . Alzheimer's disease Mother   . Hypertension Father   . Diabetes Father   . Kidney disease Father   . Gout Father   . Stroke Maternal Grandmother   . Kidney disease Maternal Grandmother   . Stroke Paternal Grandmother   . Diabetes Sister   . Obesity Sister   . Hypertension Brother   . Cancer Brother     lymphnode  . Gout Brother   . Cancer Maternal Aunt   . Anesthesia problems Neg Hx     Social History Social History  Substance Use Topics  . Smoking status: Current Every Day Smoker    Packs/day: 1.00  Years: 30.00    Types: Cigarettes  . Smokeless tobacco: Never Used  . Alcohol use No     Allergies   Penicillins   Review of Systems Review of Systems  Constitutional: Negative for activity change, appetite change and fever.  HENT: Positive for facial swelling, trouble swallowing and voice change. Negative for congestion and rhinorrhea.   Eyes: Negative for visual disturbance.  Respiratory: Negative for cough, chest tightness and shortness of breath.   Cardiovascular: Negative for chest pain.  Gastrointestinal: Negative for abdominal pain, nausea and vomiting.  Genitourinary: Negative for dysuria and hematuria.  Musculoskeletal: Negative for arthralgias  and myalgias.  Skin: Positive for rash.  Neurological: Negative for dizziness, weakness and headaches.    A complete 10 system review of systems was obtained and all systems are negative except as noted in the HPI and PMH.   Physical Exam Updated Vital Signs BP 118/90 (BP Location: Left Arm)   Pulse 110   Temp 98 F (36.7 C) (Oral)   Resp 18   Ht 5\' 10"  (1.778 m)   Wt 255 lb (115.7 kg)   LMP 09/21/2011   SpO2 97%   BMI 36.59 kg/m   Physical Exam  Constitutional: She is oriented to person, place, and time. She appears well-developed and well-nourished. No distress.  HENT:  Head: Normocephalic and atraumatic.  Mouth/Throat: Oropharynx is clear and moist. No oropharyngeal exudate.  Diffuse swelling to face and neck mild erythema. Mild lip swelling. There is no significant tongue swelling. Posterior pharynx is clear without asymmetry. No swelling underneath tongue.  Eyes: Conjunctivae and EOM are normal. Pupils are equal, round, and reactive to light.  Neck: Normal range of motion. Neck supple.  No meningismus.  Cardiovascular: Normal rate, regular rhythm, normal heart sounds and intact distal pulses.   No murmur heard. Pulmonary/Chest: Effort normal and breath sounds normal. No respiratory distress. She has no wheezes.  Abdominal: Soft. There is no tenderness. There is no rebound and no guarding.  Musculoskeletal: Normal range of motion. She exhibits no edema or tenderness.  Neurological: She is alert and oriented to person, place, and time. No cranial nerve deficit. She exhibits normal muscle tone. Coordination normal.   5/5 strength throughout. CN 2-12 intact.Equal grip strength.   Skin: Skin is warm. Rash noted.  Urticarial rash involving bilateral arms, back and neck.  Psychiatric: She has a normal mood and affect. Her behavior is normal.  Nursing note and vitals reviewed.    ED Treatments / Results  Labs (all labs ordered are listed, but only abnormal results are  displayed) Labs Reviewed - No data to display  EKG  EKG Interpretation None       Radiology No results found.  Procedures Procedures (including critical care time)  Medications Ordered in ED Medications  sodium chloride 0.9 % bolus 1,000 mL (1,000 mLs Intravenous New Bag/Given 01/09/17 0631)  famotidine (PEPCID) IVPB 20 mg premix (0 mg Intravenous Stopped 01/09/17 0630)  methylPREDNISolone sodium succinate (SOLU-MEDROL) 125 mg/2 mL injection 125 mg (125 mg Intravenous Given 01/09/17 0609)  EPINEPHrine (EPI-PEN) injection 0.3 mg (0.3 mg Intramuscular Given 01/09/17 0631)     Initial Impression / Assessment and Plan / ED Course  I have reviewed the triage vital signs and the nursing notes.  Pertinent labs & imaging results that were available during my care of the patient were reviewed by me and considered in my medical decision making (see chart for details).    Anaphylactic reaction to uncertain allergen. Patient  does have some tongue swelling and lip swelling but is in no distress. She is not wheezing. Patient is given antihistamines and steroids as well as IM epinephrine.  Given IV fluids and observed.  Recheck in a.m. Patient is improving. Denies any chest pain or shortness of breath. There is no swelling of her tongue or posterior pharynx.  Continue to observe until 9:30 AM. Patient encouraged to stop her losartan as this could be the cause of her tongue and lip swelling.  She'll be given epinephrine pen, steroids and antihistamines for home use. Dr. Rogene Houston to assume care and disposition.  CRITICAL CARE Performed by: Ezequiel Essex Total critical care time: 35 minutes Critical care time was exclusive of separately billable procedures and treating other patients. Critical care was necessary to treat or prevent imminent or life-threatening deterioration. Critical care was time spent personally by me on the following activities: development of treatment plan with  patient and/or surrogate as well as nursing, discussions with consultants, evaluation of patient's response to treatment, examination of patient, obtaining history from patient or surrogate, ordering and performing treatments and interventions, ordering and review of laboratory studies, ordering and review of radiographic studies, pulse oximetry and re-evaluation of patient's condition.   Final Clinical Impressions(s) / ED Diagnoses   Final diagnoses:  Anaphylaxis, initial encounter    New Prescriptions New Prescriptions   No medications on file     Ezequiel Essex, MD 01/09/17 [phone]

## 2017-01-09 NOTE — Discharge Instructions (Signed)
Continue the steroids and antihistamines as prescribed. Use the epinephrine pen as needed for severe difficulty breathing or swallowing.  Stop taking losartan and follow up with your doctor. Return to the ED if you develop new or worsening symptoms.

## 2018-10-17 ENCOUNTER — Emergency Department (HOSPITAL_COMMUNITY): Payer: BLUE CROSS/BLUE SHIELD

## 2018-10-17 ENCOUNTER — Encounter (HOSPITAL_COMMUNITY): Payer: Self-pay | Admitting: Emergency Medicine

## 2018-10-17 ENCOUNTER — Other Ambulatory Visit: Payer: Self-pay

## 2018-10-17 ENCOUNTER — Emergency Department (HOSPITAL_COMMUNITY)
Admission: EM | Admit: 2018-10-17 | Discharge: 2018-10-18 | Disposition: A | Discharge status: Home / Self Care | Payer: BLUE CROSS/BLUE SHIELD | Attending: Emergency Medicine | Admitting: Emergency Medicine

## 2018-10-17 DIAGNOSIS — R112 Nausea with vomiting, unspecified: Secondary | ICD-10-CM | POA: Diagnosis not present

## 2018-10-17 DIAGNOSIS — I10 Essential (primary) hypertension: Secondary | ICD-10-CM | POA: Diagnosis not present

## 2018-10-17 DIAGNOSIS — R69 Illness, unspecified: Secondary | ICD-10-CM

## 2018-10-17 DIAGNOSIS — Z79899 Other long term (current) drug therapy: Secondary | ICD-10-CM | POA: Diagnosis not present

## 2018-10-17 DIAGNOSIS — J111 Influenza due to unidentified influenza virus with other respiratory manifestations: Secondary | ICD-10-CM | POA: Diagnosis not present

## 2018-10-17 DIAGNOSIS — F17210 Nicotine dependence, cigarettes, uncomplicated: Secondary | ICD-10-CM | POA: Insufficient documentation

## 2018-10-17 DIAGNOSIS — R05 Cough: Secondary | ICD-10-CM | POA: Diagnosis not present

## 2018-10-17 DIAGNOSIS — M7918 Myalgia, other site: Secondary | ICD-10-CM | POA: Diagnosis present

## 2018-10-17 LAB — BASIC METABOLIC PANEL
Anion gap: 8 (ref 5–15)
BUN: 6 mg/dL (ref 6–20)
CHLORIDE: 100 mmol/L (ref 98–111)
CO2: 26 mmol/L (ref 22–32)
CREATININE: 0.72 mg/dL (ref 0.44–1.00)
Calcium: 9.5 mg/dL (ref 8.9–10.3)
Glucose, Bld: 119 mg/dL — ABNORMAL HIGH (ref 70–99)
Potassium: 2.8 mmol/L — ABNORMAL LOW (ref 3.5–5.1)
SODIUM: 134 mmol/L — AB (ref 135–145)

## 2018-10-17 LAB — CBC
HCT: 44.3 % (ref 36.0–46.0)
HEMOGLOBIN: 14.4 g/dL (ref 12.0–15.0)
MCH: 26.8 pg (ref 26.0–34.0)
MCHC: 32.5 g/dL (ref 30.0–36.0)
MCV: 82.3 fL (ref 80.0–100.0)
PLATELETS: 252 10*3/uL (ref 150–400)
RBC: 5.38 MIL/uL — ABNORMAL HIGH (ref 3.87–5.11)
RDW: 13.9 % (ref 11.5–15.5)
WBC: 10.6 10*3/uL — AB (ref 4.0–10.5)
nRBC: 0 % (ref 0.0–0.2)

## 2018-10-17 LAB — URINALYSIS, ROUTINE W REFLEX MICROSCOPIC
BILIRUBIN URINE: NEGATIVE
Glucose, UA: NEGATIVE mg/dL
HGB URINE DIPSTICK: NEGATIVE
KETONES UR: NEGATIVE mg/dL
Leukocytes, UA: NEGATIVE
NITRITE: NEGATIVE
PROTEIN: NEGATIVE mg/dL
SPECIFIC GRAVITY, URINE: 1.005 (ref 1.005–1.030)
pH: 6 (ref 5.0–8.0)

## 2018-10-17 MED ORDER — OSELTAMIVIR PHOSPHATE 75 MG PO CAPS
75.0000 mg | ORAL_CAPSULE | Freq: Once | ORAL | Status: AC
  Administered 2018-10-17: 75 mg via ORAL
  Filled 2018-10-17: qty 1

## 2018-10-17 MED ORDER — IBUPROFEN 800 MG PO TABS
800.0000 mg | ORAL_TABLET | Freq: Once | ORAL | Status: AC
  Administered 2018-10-17: 800 mg via ORAL
  Filled 2018-10-17: qty 1

## 2018-10-17 MED ORDER — KETOROLAC TROMETHAMINE 30 MG/ML IJ SOLN
30.0000 mg | Freq: Once | INTRAMUSCULAR | Status: AC
  Administered 2018-10-17: 30 mg via INTRAVENOUS
  Filled 2018-10-17: qty 1

## 2018-10-17 MED ORDER — OSELTAMIVIR PHOSPHATE 75 MG PO CAPS: 75.0000 mg | ORAL_CAPSULE | Freq: Two times a day (BID) | ORAL | 0 refills | Status: DC

## 2018-10-17 MED ORDER — BENZONATATE 100 MG PO CAPS: 100.0000 mg | ORAL_CAPSULE | Freq: Three times a day (TID) | ORAL | 0 refills | Status: DC

## 2018-10-17 MED ORDER — ONDANSETRON HCL 4 MG/2ML IJ SOLN
4.0000 mg | Freq: Once | INTRAMUSCULAR | Status: AC
  Administered 2018-10-17: 4 mg via INTRAVENOUS
  Filled 2018-10-17: qty 2

## 2018-10-17 MED ORDER — SODIUM CHLORIDE 0.9 % IV SOLN
1000.0000 mL | INTRAVENOUS | Status: DC
  Administered 2018-10-17: 1000 mL via INTRAVENOUS

## 2018-10-17 MED ORDER — HYDROCODONE-ACETAMINOPHEN 5-325 MG PO TABS
1.0000 | ORAL_TABLET | ORAL | Status: AC
  Administered 2018-10-17: 1 via ORAL
  Filled 2018-10-17: qty 1

## 2018-10-17 MED ORDER — ONDANSETRON 8 MG PO TBDP: 8.0000 mg | ORAL_TABLET | Freq: Three times a day (TID) | ORAL | 0 refills | Status: DC | PRN

## 2018-10-17 MED ORDER — SODIUM CHLORIDE 0.9 % IV BOLUS (SEPSIS)
1000.0000 mL | Freq: Once | INTRAVENOUS | Status: AC
  Administered 2018-10-17: 1000 mL via INTRAVENOUS

## 2018-10-17 MED ORDER — POTASSIUM CHLORIDE CRYS ER 20 MEQ PO TBCR
40.0000 meq | EXTENDED_RELEASE_TABLET | Freq: Once | ORAL | Status: AC
  Administered 2018-10-17: 40 meq via ORAL
  Filled 2018-10-17: qty 2

## 2018-10-17 MED ORDER — NAPROXEN 500 MG PO TABS: 500.0000 mg | ORAL_TABLET | Freq: Two times a day (BID) | ORAL | 0 refills | Status: DC

## 2018-10-17 NOTE — ED Notes (Signed)
Patient came out of room while I was assisting another patient and started yelling that she wanted water and nobody would help her. I told pt that I would bring her water as soon as I could. Patient's family member complaining about chairs in the Emergency Room stating that he wanted a recliner. Searched for recliner and could not find one. Patient and family member rude to staff.

## 2018-10-17 NOTE — ED Triage Notes (Signed)
Pt c/o body aches, cough, and chills that started today.

## 2018-10-17 NOTE — ED Provider Notes (Signed)
[facility] EMERGENCY DEPARTMENT Provider Note   CSN: [id number] Arrival date & time: 10/17/18  1935     History   Chief Complaint Chief Complaint  Patient presents with  . Generalized Body Aches    HPI Lauren Banks is a 52 y.o. female.  HPI Patient states she started to feel sick all of a sudden today.  She has had diffuse body aches.  She has been coughing.  Her chest feels like it is burning.  She has had some nausea vomiting especially after coughing.  She denies any dysuria or urinary frequency but she has had some intermittent discomfort in the right lower abdomen.  Patient has diffuse body aches and involving her back and her extremities.   Past Medical History:  Diagnosis Date  . Hyperlipemia   . Hypertension     Patient Active Problem List   Diagnosis Date Noted  . Ovarian cyst, left 01/19/2014  . Abdominal pain in female patient 01/18/2014  . anemia, iron deficiency 08/31/2011    Class: Chronic    Past Surgical History:  Procedure Laterality Date  . ABDOMINAL HYSTERECTOMY  10/13/2011   Procedure: HYSTERECTOMY ABDOMINAL;  Surgeon: Jonnie Kind, MD;  Location: [facility];  Service: Gynecology;  Laterality: N/A;     OB History   No obstetric history on file.      Home Medications    Prior to Admission medications   Medication Sig Start Date End Date Taking? Authorizing Provider  bumetanide (BUMEX) 0.5 MG tablet Take 0.25-0.5 mg by mouth daily. Take 1/2 -1 tablet twice daily as needed.   Yes [provider]  hydrochlorothiazide (HYDRODIURIL) 25 MG tablet Take 1 tablet (25 mg total) by mouth daily. 10/15/11 10/17/18 Yes Jonnie Kind, MD  hydrochlorothiazide (HYDRODIURIL) 25 MG tablet Take 25 mg by mouth daily.  03/31/16  Yes [provider]  levocetirizine (XYZAL) 5 MG tablet Take 5 mg by mouth every evening.   Yes [provider]  metoprolol succinate (TOPROL-XL) 25 MG 24 hr tablet Take 25 mg by mouth daily.   Yes [provider]  pravastatin (PRAVACHOL) 20 MG tablet Take 20 mg by mouth daily.   Yes [provider]  Vitamin D, Ergocalciferol, (DRISDOL) 50000 units CAPS capsule Take 50,000 Units by mouth 2 (two) times a week.  02/15/16  Yes [provider]  benzonatate (TESSALON) 100 MG capsule Take 1 capsule (100 mg total) by mouth every 8 (eight) hours. 10/17/18   Dorie Rank, MD  naproxen (NAPROSYN) 500 MG tablet Take 1 tablet (500 mg total) by mouth 2 (two) times daily with a meal. As needed for pain 10/17/18   Dorie Rank, MD  ondansetron (ZOFRAN ODT) 8 MG disintegrating tablet Take 1 tablet (8 mg total) by mouth every 8 (eight) hours as needed for nausea or vomiting. 10/17/18   Dorie Rank, MD  oseltamivir (TAMIFLU) 75 MG capsule Take 1 capsule (75 mg total) by mouth 2 (two) times daily. 10/17/18   Dorie Rank, MD    Family History Family History  Problem Relation Age of Onset  . Cancer Mother        breast- around age 59, ovarian- around age41  . Hypertension Mother   . Alzheimer's disease Mother   . Hypertension Father   . Diabetes Father   . Kidney disease Father   . Gout Father   . Stroke Maternal Grandmother   . Kidney disease Maternal Grandmother   . Stroke Paternal Grandmother   .  Diabetes Sister   . Obesity Sister   . Hypertension Brother   . Cancer Brother        lymphnode  . Gout Brother   . Cancer Maternal Aunt   . Anesthesia problems Neg Hx     Social History Social History   Tobacco Use  . Smoking status: Current Every Day Smoker    Packs/day: 1.00    Years: 30.00    Pack years: 30.00    Types: Cigarettes  . Smokeless tobacco: Never Used  Substance Use Topics  . Alcohol use: No  . Drug use: No     Allergies   Penicillins   Review of Systems Review of Systems  All other systems reviewed and are negative.    Physical Exam Updated Vital Signs BP (!) 157/89 (BP Location: Right Arm)   Pulse (!) 132   Temp (!) 103.1 F (39.5 C) (Oral)    Resp (!) 21   Ht 1.778 m (5\' 10" )   Wt 117.5 kg   LMP 09/21/2011   SpO2 96%   BMI 37.16 kg/m   Physical Exam Vitals signs and nursing note reviewed.  Constitutional:      General: She is not in acute distress.    Appearance: She is well-developed. She is ill-appearing. She is not toxic-appearing.  HENT:     Head: Normocephalic and atraumatic.     Right Ear: External ear normal.     Left Ear: External ear normal.  Eyes:     General: No scleral icterus.       Right eye: No discharge.        Left eye: No discharge.     Conjunctiva/sclera: Conjunctivae normal.  Neck:     Musculoskeletal: Neck supple.     Trachea: No tracheal deviation.  Cardiovascular:     Rate and Rhythm: Normal rate and regular rhythm.  Pulmonary:     Effort: Pulmonary effort is normal. No respiratory distress.     Breath sounds: Normal breath sounds. No stridor. No wheezing or rales.  Abdominal:     General: Bowel sounds are normal. There is no distension.     Palpations: Abdomen is soft.     Tenderness: There is no abdominal tenderness. There is no guarding or rebound.  Musculoskeletal:        General: No tenderness.  Skin:    General: Skin is warm and dry.     Findings: No rash.  Neurological:     Mental Status: She is alert.     Cranial Nerves: No cranial nerve deficit (no facial droop, extraocular movements intact, no slurred speech).     Sensory: No sensory deficit.     Motor: No abnormal muscle tone or seizure activity.     Coordination: Coordination normal.      ED Treatments / Results  Labs (all labs ordered are listed, but only abnormal results are displayed) Labs Reviewed  CBC - Abnormal; Notable for the following components:      Result Value   WBC 10.6 (*)    RBC 5.38 (*)    All other components within normal limits  BASIC METABOLIC PANEL - Abnormal; Notable for the following components:   Sodium 134 (*)    Potassium 2.8 (*)    Glucose, Bld 119 (*)    All other components  within normal limits  URINALYSIS, ROUTINE W REFLEX MICROSCOPIC - Abnormal; Notable for the following components:   Color, Urine STRAW (*)    All other  components within normal limits     Radiology Dg Chest 2 View  Result Date: 10/17/2018 CLINICAL DATA:  Cough, chills, and body aches. EXAM: CHEST - 2 VIEW COMPARISON:  Chest x-ray dated September 23, 2009. FINDINGS: The heart size and mediastinal contours are within normal limits. Normal pulmonary vascularity. Chronic central peribronchial thickening. No focal consolidation, pleural effusion, or pneumothorax. No acute osseous abnormality. IMPRESSION: 1.  No active cardiopulmonary disease. 2. Chronic bronchitic changes, likely smoking-related. Electronically Signed   By: Titus Dubin M.D.   On: 10/17/2018 20:24    Procedures Procedures (including critical care time)  Medications Ordered in ED Medications  sodium chloride 0.9 % bolus 1,000 mL (0 mLs Intravenous Stopped 10/17/18 2311)    Followed by  0.9 %  sodium chloride infusion (0 mLs Intravenous Stopped 10/17/18 2348)  ibuprofen (ADVIL,MOTRIN) tablet 800 mg (800 mg Oral Given 10/17/18 1945)  ketorolac (TORADOL) 30 MG/ML injection 30 mg (30 mg Intravenous Given 10/17/18 2205)  ondansetron (ZOFRAN) injection 4 mg (4 mg Intravenous Given 10/17/18 2205)  potassium chloride SA (K-DUR,KLOR-CON) CR tablet 40 mEq (40 mEq Oral Given 10/17/18 2343)  HYDROcodone-acetaminophen (NORCO/VICODIN) 5-325 MG per tablet 1 tablet (1 tablet Oral Given 10/17/18 2343)  oseltamivir (TAMIFLU) capsule 75 mg (75 mg Oral Given 10/17/18 2343)     Initial Impression / Assessment and Plan / ED Course  I have reviewed the triage vital signs and the nursing notes.  Pertinent labs & imaging results that were available during my care of the patient were reviewed by me and considered in my medical decision making (see chart for details).  Clinical Course as of Oct 17 2348  Mon Oct 17, 2018  2344 Labs reviewed.   Hypokalemia noted.  Mild increase in white blood cell count.  Chest x-ray without pneumonia.  Urinalysis without UTI   [JK]  2344 HR down to 110.  Fever has resolved   [JK]    Clinical Course User Index [JK] Dorie Rank, MD   Patient symptoms are consistent with influenza.  She has high fever, headache and myalgias.  Patient was treated with IV fluids and antipyretics.  She had some mild relief of her symptoms.  Patient symptoms started just within the last 24 hours so I think she is a good candidate for Tamiflu.  She was also prescribed Naprosyn Tessalon and Zofran.  Warning signs and precautions discussed.  Final Clinical Impressions(s) / ED Diagnoses   Final diagnoses:  Influenza-like illness    ED Discharge Orders         Ordered    oseltamivir (TAMIFLU) 75 MG capsule  2 times daily     10/17/18 2343    benzonatate (TESSALON) 100 MG capsule  Every 8 hours     10/17/18 2343    naproxen (NAPROSYN) 500 MG tablet  2 times daily with meals     10/17/18 2343    ondansetron (ZOFRAN ODT) 8 MG disintegrating tablet  Every 8 hours PRN     10/17/18 2343           Dorie Rank, MD 10/17/18 2349

## 2018-10-17 NOTE — Discharge Instructions (Addendum)
Take the medications as prescribed, influenza can take at least a week before you start feeling much better.  You can also take Tylenol to help with fever.  Drink plenty of fluids and rest.  We will up with your doctor to be rechecked to make sure you are improving.  Return to the emergency room as needed for worsening symptoms

## 2018-12-07 DIAGNOSIS — R739 Hyperglycemia, unspecified: Secondary | ICD-10-CM | POA: Diagnosis not present

## 2018-12-07 DIAGNOSIS — E559 Vitamin D deficiency, unspecified: Secondary | ICD-10-CM | POA: Diagnosis not present

## 2018-12-07 DIAGNOSIS — E782 Mixed hyperlipidemia: Secondary | ICD-10-CM | POA: Diagnosis not present

## 2018-12-07 DIAGNOSIS — D72829 Elevated white blood cell count, unspecified: Secondary | ICD-10-CM | POA: Diagnosis not present

## 2018-12-12 DIAGNOSIS — E782 Mixed hyperlipidemia: Secondary | ICD-10-CM | POA: Diagnosis not present

## 2018-12-12 DIAGNOSIS — Z713 Dietary counseling and surveillance: Secondary | ICD-10-CM | POA: Diagnosis not present

## 2018-12-12 DIAGNOSIS — D72829 Elevated white blood cell count, unspecified: Secondary | ICD-10-CM | POA: Diagnosis not present

## 2018-12-12 DIAGNOSIS — R609 Edema, unspecified: Secondary | ICD-10-CM | POA: Diagnosis not present

## 2019-06-06 DIAGNOSIS — D72829 Elevated white blood cell count, unspecified: Secondary | ICD-10-CM | POA: Diagnosis not present

## 2019-06-06 DIAGNOSIS — E559 Vitamin D deficiency, unspecified: Secondary | ICD-10-CM | POA: Diagnosis not present

## 2019-06-06 DIAGNOSIS — R7303 Prediabetes: Secondary | ICD-10-CM | POA: Diagnosis not present

## 2019-06-06 DIAGNOSIS — Z79899 Other long term (current) drug therapy: Secondary | ICD-10-CM | POA: Diagnosis not present

## 2019-06-13 DIAGNOSIS — E782 Mixed hyperlipidemia: Secondary | ICD-10-CM | POA: Diagnosis not present

## 2019-06-13 DIAGNOSIS — D72829 Elevated white blood cell count, unspecified: Secondary | ICD-10-CM | POA: Diagnosis not present

## 2019-06-13 DIAGNOSIS — R609 Edema, unspecified: Secondary | ICD-10-CM | POA: Diagnosis not present

## 2019-06-13 DIAGNOSIS — Z713 Dietary counseling and surveillance: Secondary | ICD-10-CM | POA: Diagnosis not present

## 2019-09-07 DIAGNOSIS — S61219A Laceration without foreign body of unspecified finger without damage to nail, initial encounter: Secondary | ICD-10-CM | POA: Diagnosis not present

## 2019-09-21 DIAGNOSIS — S61412D Laceration without foreign body of left hand, subsequent encounter: Secondary | ICD-10-CM | POA: Diagnosis not present

## 2019-12-01 DIAGNOSIS — E782 Mixed hyperlipidemia: Secondary | ICD-10-CM | POA: Diagnosis not present

## 2019-12-01 DIAGNOSIS — Z79899 Other long term (current) drug therapy: Secondary | ICD-10-CM | POA: Diagnosis not present

## 2019-12-01 DIAGNOSIS — E559 Vitamin D deficiency, unspecified: Secondary | ICD-10-CM | POA: Diagnosis not present

## 2019-12-01 DIAGNOSIS — R7303 Prediabetes: Secondary | ICD-10-CM | POA: Diagnosis not present

## 2019-12-08 DIAGNOSIS — R609 Edema, unspecified: Secondary | ICD-10-CM | POA: Diagnosis not present

## 2019-12-08 DIAGNOSIS — Z713 Dietary counseling and surveillance: Secondary | ICD-10-CM | POA: Diagnosis not present

## 2019-12-08 DIAGNOSIS — E782 Mixed hyperlipidemia: Secondary | ICD-10-CM | POA: Diagnosis not present

## 2019-12-08 DIAGNOSIS — D72829 Elevated white blood cell count, unspecified: Secondary | ICD-10-CM | POA: Diagnosis not present

## 2020-01-12 DIAGNOSIS — F419 Anxiety disorder, unspecified: Secondary | ICD-10-CM | POA: Diagnosis not present

## 2020-02-29 DIAGNOSIS — J40 Bronchitis, not specified as acute or chronic: Secondary | ICD-10-CM | POA: Diagnosis not present

## 2020-06-14 DIAGNOSIS — Z79899 Other long term (current) drug therapy: Secondary | ICD-10-CM | POA: Diagnosis not present

## 2020-06-14 DIAGNOSIS — E559 Vitamin D deficiency, unspecified: Secondary | ICD-10-CM | POA: Diagnosis not present

## 2020-06-14 DIAGNOSIS — R7303 Prediabetes: Secondary | ICD-10-CM | POA: Diagnosis not present

## 2020-06-14 DIAGNOSIS — E782 Mixed hyperlipidemia: Secondary | ICD-10-CM | POA: Diagnosis not present

## 2020-06-21 DIAGNOSIS — E782 Mixed hyperlipidemia: Secondary | ICD-10-CM | POA: Diagnosis not present

## 2020-06-21 DIAGNOSIS — R05 Cough: Secondary | ICD-10-CM | POA: Diagnosis not present

## 2020-06-21 DIAGNOSIS — Z713 Dietary counseling and surveillance: Secondary | ICD-10-CM | POA: Diagnosis not present

## 2020-06-21 DIAGNOSIS — R609 Edema, unspecified: Secondary | ICD-10-CM | POA: Diagnosis not present

## 2020-06-21 DIAGNOSIS — Z720 Tobacco use: Secondary | ICD-10-CM | POA: Diagnosis not present

## 2020-06-21 DIAGNOSIS — D72829 Elevated white blood cell count, unspecified: Secondary | ICD-10-CM | POA: Diagnosis not present

## 2020-08-07 ENCOUNTER — Ambulatory Visit: Payer: BLUE CROSS/BLUE SHIELD | Admitting: Internal Medicine

## 2020-08-07 ENCOUNTER — Encounter: Payer: Self-pay | Admitting: Internal Medicine

## 2020-08-07 ENCOUNTER — Other Ambulatory Visit: Payer: Self-pay

## 2020-08-07 DIAGNOSIS — F17210 Nicotine dependence, cigarettes, uncomplicated: Secondary | ICD-10-CM | POA: Diagnosis not present

## 2020-08-07 DIAGNOSIS — R058 Other specified cough: Secondary | ICD-10-CM | POA: Insufficient documentation

## 2020-08-07 MED ORDER — PANTOPRAZOLE SODIUM 40 MG PO TBEC: 40.0000 mg | DELAYED_RELEASE_TABLET | Freq: Every day | ORAL | 2 refills | Status: DC

## 2020-08-07 MED ORDER — FAMOTIDINE 20 MG PO TABS: ORAL_TABLET | ORAL | 11 refills | Status: DC

## 2020-08-07 MED ORDER — ACETAMINOPHEN-CODEINE #3 300-30 MG PO TABS: 1.0000 | ORAL_TABLET | ORAL | 0 refills | Status: DC | PRN

## 2020-08-07 MED ORDER — ACETAMINOPHEN-CODEINE #3 300-30 MG PO TABS
1.0000 | ORAL_TABLET | ORAL | 0 refills | Status: AC | PRN
Start: 2020-08-07 — End: 2020-08-12

## 2020-08-07 MED ORDER — PREDNISONE 10 MG PO TABS: ORAL_TABLET | ORAL | 0 refills | Status: DC

## 2020-08-07 NOTE — Progress Notes (Signed)
Lauren Banks, female    DOB: 22-Jan-1966,    MRN: [id number]   Brief patient profile:  22 yobf active smoker healthy until 20s pattern was freq springtime runny nose and chest tightness better with steroid shot never needed  Inhaler  with allergy eval at [facility] clinic in 2018 "never got any results" but symptoms more chronic p flu like illness 10/2018 eval in ER at [facility] dx as Flu and acute symptoms all resolved by coughing ever since so referred to pulmonary clinic in [facility]  08/07/2020 by Angelina Ok NP       History of Present Illness  08/07/2020  Pulmonary/ 1st office eval/ Lauren Banks / [place] [facility]  Chief Complaint  Patient presents with   Consult    non-productive and productive cough with clear/light yellow phlegm  Dyspnea:  Not limited by breathing from desired activities   Cough: worse in heat and humidity daily cough to point of vomit / mouth dry/ caffeine makes it worse  / min production  Assoc with urinary incont/  Sleep: immediately on lying down bed is flat/ one pillow  SABA use:  Helps a smidge /the neb better and lasts up to 4 hours codiene works/ not sure about prednisone Cough syncope x one   No obvious day to day or daytime variability or assoc excess/ purulent sputum or mucus plugs or hemoptysis or cp or chest tightness, subjective wheeze or overt sinus or hb symptoms.     Also denies any obvious fluctuation of symptoms with weather or environmental changes or other aggravating or alleviating factors except as outlined above   No unusual exposure hx or h/o childhood pna/ asthma or knowledge of premature birth.  Current Allergies, Complete Past Medical History, Past Surgical History, Family History, and Social History were reviewed in [employer] record.  ROS  The following are not active complaints unless bolded Hoarseness, sore throat, dysphagia, dental problems, itching, sneezing,  nasal congestion or discharge of excess mucus or  purulent secretions, ear ache,   fever, chills, sweats, unintended wt loss or wt gain, classically pleuritic or exertional cp,  orthopnea pnd or arm/hand swelling  or leg swelling, presyncope, palpitations, abdominal pain, anorexia, nausea, vomiting, diarrhea  or change in bowel habits or change in bladder habits, change in stools or change in urine, dysuria, hematuria,  rash, arthralgias, visual complaints, headache, numbness, weakness or ataxia or problems with walking or coordination,  change in mood or  memory.              Past Medical History:  Diagnosis Date   Hyperlipemia    Hypertension     Outpatient Medications Prior to Visit  Medication Sig Dispense Refill   albuterol (VENTOLIN HFA) 108 (90 Base) MCG/ACT inhaler      buPROPion (WELLBUTRIN XL) 300 MG 24 hr tablet Take 300 mg by mouth daily.     escitalopram (LEXAPRO) 20 MG tablet Take 20 mg by mouth daily.     hydrochlorothiazide (HYDRODIURIL) 25 MG tablet Take 25 mg by mouth daily.   0   ipratropium-albuterol (DUONEB) 0.5-2.5 (3) MG/3ML SOLN SMARTSIG:3 Milliliter(s) Via Nebulizer Every 6 Hours     levocetirizine (XYZAL) 5 MG tablet Take 5 mg by mouth every evening.     metoprolol succinate (TOPROL-XL) 25 MG 24 hr tablet Take 25 mg by mouth daily.     hydrochlorothiazide (HYDRODIURIL) 25 MG tablet Take 1 tablet (25 mg total) by mouth daily. 30 tablet 6  0             0       0       0                   Objective:     BP 132/88 (BP Location: Left Arm, Cuff Size: Normal)    Pulse 77    Temp (!) 97.5 F (36.4 C) (Other (Comment)) Comment (Src): wrist   Ht 5\' 10"  (1.778 m)    Wt 254 lb 3.2 oz (115.3 kg)    LMP 09/21/2011    SpO2 100% Comment: Room air   BMI 36.47 kg/m   SpO2: 100 % (Room air)   amb pleasant mod  obese bf nad   HEENT : pt wearing mask not removed for exam due to covid -19 concerns.    NECK :  without JVD/Nodes/TM/ nl carotid upstrokes bilaterally   LUNGS: no acc muscle  use,  Nl contour chest which is clear to A and P bilaterally without cough on insp or exp maneuvers   CV:  RRR  no s3 or murmur or increase in P2, and no edema   ABD:  soft and nontender with nl inspiratory excursion in the supine position. No bruits or organomegaly appreciated, bowel sounds nl  MS:  Nl gait/ ext warm without deformities, calf tenderness, cyanosis or clubbing No obvious joint restrictions   SKIN: warm and dry without lesions    NEURO:  alert, approp, nl sensorium with  no motor or cerebellar deficits apparent.          Assessment   Upper airway cough syndrome Onset Dec 2019 see ER note [facility] c/w flu  -  Cyclical cough rx  07/05/2354   The most common causes of chronic cough in immunocompetent adults include the following: upper airway cough syndrome (UACS), previously referred to as postnasal drip syndrome (PNDS), which is caused by variety of rhinosinus conditions; (2) asthma; (3) GERD; (4) chronic bronchitis from cigarette smoking or other inhaled environmental irritants; (5) nonasthmatic eosinophilic bronchitis; and (6) bronchiectasis.   These conditions, singly or in combination, have accounted for up to 94% of the causes of chronic cough in prospective studies.   Other conditions have constituted no >6% of the causes in prospective studies These have included bronchogenic carcinoma, chronic interstitial pneumonia, sarcoidosis, left ventricular failure, ACEI-induced cough, and aspiration from a condition associated with pharyngeal dysfunction.    Chronic cough is often simultaneously caused by more than one condition. A single cause has been found from 38 to 82% of the time, multiple causes from 18 to 62%. Multiply caused cough has been the result of three diseases up to 42% of the time.       Of the three most common causes of  Sub-acute / recurrent or chronic cough, only one (GERD, which she must have as coughs to point of vomit)  can actually contribute to/  trigger  the other two (asthma and post nasal drip syndrome)  and perpetuate the cylce of cough.  While not intuitively obvious, many patients with chronic low grade reflux do not cough until there is a primary insult that disturbs the protective epithelial barrier and exposes sensitive nerve endings.     >>> This is typically viral but can due to PNDS and  Former probably applies here/ more likely than latter .   The point is that once this occurs, it is difficult to eliminate the cycle  using  anything but a maximally effective acid suppression regimen at least in the short run, accompanied by an appropriate diet to address non acid GERD and control / eliminate the cough itself for at least 3 days with tylenol #3 and added 6 days of Prednisone in case of component of Th-2 driven upper or lower airways inflammation (if cough responds short term only to relapse befor return while will on rx for uacs that would point to allergic rhinitis/ asthma or eos bronchitis)    Given text and graphic illustration of cyclical cough mech and how to eliminate it by treating multiple mechs all at once.  >>> f/u in 4 weeks     Cigarette smoker Counseled re importance of smoking cessation but did not meet time criteria for separate billing    Also advised: Pt informed of the seriousness of COVID 19 infection as a direct risk to lung health  and safey and to close contacts and should continue to wear a facemask in public and minimize exposure to public locations but especially avoid any area or activity where non-close contacts are not observing distancing or wearing an appropriate face mask.  I strongly recommended she take either of the vaccines available through local drugstores based on updated information on millions of Americans treated with the [place] products  which have proven both safe and  effective even against the new delta variant.         Each maintenance medication was reviewed in detail  including emphasizing most importantly the difference between maintenance and prns and under what circumstances the prns are to be triggered using an action plan format where appropriate.  Total time for H and P, chart review, counseling,   and generating customized AVS unique to this office visit / charting = 60 min         Christinia Gully, MD 08/07/2020

## 2020-08-07 NOTE — Patient Instructions (Addendum)
The key to effective treatment for your cough is eliminating the non-stop cycle of cough you're stuck in long enough to let your airway heal completely and then see if there is anything still making you cough once you stop the cough suppression, but this should take no more than 5 days to figure out.  Take delsym two tsp every 12 hours and supplement if needed with  Tylenol #3   up to 1-2 every 4 hours to suppress the urge to cough. Swallowing water and/or using ice chips/non mint and menthol containing candies (such as lifesavers or sugarless jolly ranchers) are also effective.  You should rest your voice and avoid activities that you know make you cough.  Once you have eliminated the cough for 3 straight days try reducing the Tylenol #3 first,  then the delsym as tolerated.      Prednisone 10 mg take  4 each am x 2 days,   2 each am x 2 days,  1 each am x 2 days and stop (this is to eliminate allergies and inflammation from coughing)  Protonix (pantoprazole) Take 30-60 min before first meal of the day and Pepcid 20 mg one after supper until return to office  GERD (REFLUX)  is an extremely common cause of respiratory symptoms, many times with no significant heartburn at all.    It can be treated with medication, but also with lifestyle changes including avoidance of late meals, excessive alcohol, smoking cessation, and avoid fatty foods, chocolate, peppermint, colas, red wine, and acidic juices such as orange juice.  NO MINT OR MENTHOL PRODUCTS SO NO COUGH DROPS   USE HARD CANDY INSTEAD (jolley ranchers or Stover's or Lifesavers (all available in sugarless versions) NO OIL BASED VITAMINS - use powdered substitutes.  The key is to stop smoking completely before smoking completely stops you!      I very strongly recommend you get the moderna or pfizer vaccine as soon as possible based on your risk of dying from the virus  and the proven safety and benefit of these vaccines against even the delta  variant.  This can save your life as well as  those of your loved ones,  especially if they are also not vaccinated.     Please schedule a follow up office visit in 4 weeks, sooner if needed  with all medications /inhalers/ solutions in hand so we can verify exactly what you are taking. This includes all medications from all doctors and over the counters Add: needs cxr on return unless done at [place] [facility]

## 2020-08-08 ENCOUNTER — Encounter: Payer: Self-pay | Admitting: Internal Medicine

## 2020-08-08 DIAGNOSIS — F17210 Nicotine dependence, cigarettes, uncomplicated: Secondary | ICD-10-CM | POA: Insufficient documentation

## 2020-08-08 NOTE — Assessment & Plan Note (Signed)
Onset Dec 2019 see ER note [facility] c/w flu  -  Cyclical cough rx  96/12/8364   The most common causes of chronic cough in immunocompetent adults include the following: upper airway cough syndrome (UACS), previously referred to as postnasal drip syndrome (PNDS), which is caused by variety of rhinosinus conditions; (2) asthma; (3) GERD; (4) chronic bronchitis from cigarette smoking or other inhaled environmental irritants; (5) nonasthmatic eosinophilic bronchitis; and (6) bronchiectasis.   These conditions, singly or in combination, have accounted for up to 94% of the causes of chronic cough in prospective studies.   Other conditions have constituted no >6% of the causes in prospective studies These have included bronchogenic carcinoma, chronic interstitial pneumonia, sarcoidosis, left ventricular failure, ACEI-induced cough, and aspiration from a condition associated with pharyngeal dysfunction.    Chronic cough is often simultaneously caused by more than one condition. A single cause has been found from 38 to 82% of the time, multiple causes from 18 to 62%. Multiply caused cough has been the result of three diseases up to 42% of the time.       Of the three most common causes of  Sub-acute / recurrent or chronic cough, only one (GERD, which she must have as coughs to point of vomit)  can actually contribute to/ trigger  the other two (asthma and post nasal drip syndrome)  and perpetuate the cylce of cough.  While not intuitively obvious, many patients with chronic low grade reflux do not cough until there is a primary insult that disturbs the protective epithelial barrier and exposes sensitive nerve endings.     >>> This is typically viral but can due to PNDS and  Former probably applies here/ more likely than latter .   The point is that once this occurs, it is difficult to eliminate the cycle  using anything but a maximally effective acid suppression regimen at least in the short run, accompanied by  an appropriate diet to address non acid GERD and control / eliminate the cough itself for at least 3 days with tylenol #3 and added 6 days of Prednisone in case of component of Th-2 driven upper or lower airways inflammation (if cough responds short term only to relapse befor return while will on rx for uacs that would point to allergic rhinitis/ asthma or eos bronchitis)    Given text and graphic illustration of cyclical cough mech and how to eliminate it by treating multiple mechs all at once.   >>> f/u in 4 weeks

## 2020-08-08 NOTE — Assessment & Plan Note (Signed)
Counseled re importance of smoking cessation but did not meet time criteria for separate billing    Also advised: Pt informed of the seriousness of COVID 19 infection as a direct risk to lung health  and safey and to close contacts and should continue to wear a facemask in public and minimize exposure to public locations but especially avoid any area or activity where non-close contacts are not observing distancing or wearing an appropriate face mask.  I strongly recommended she take either of the vaccines available through local drugstores based on updated information on millions of Americans treated with the [place] products  which have proven both safe and  effective even against the new delta variant.            Each maintenance medication was reviewed in detail including emphasizing most importantly the difference between maintenance and prns and under what circumstances the prns are to be triggered using an action plan format where appropriate.  Total time for H and P, chart review, counseling,   and generating customized AVS unique to this office visit / charting = 60 min

## 2020-09-06 ENCOUNTER — Encounter: Payer: Self-pay | Admitting: Internal Medicine

## 2020-09-06 ENCOUNTER — Ambulatory Visit: Payer: BLUE CROSS/BLUE SHIELD | Admitting: Internal Medicine

## 2020-09-06 ENCOUNTER — Other Ambulatory Visit: Payer: Self-pay

## 2020-09-06 DIAGNOSIS — R0609 Other forms of dyspnea: Secondary | ICD-10-CM | POA: Insufficient documentation

## 2020-09-06 DIAGNOSIS — I498 Other specified cardiac arrhythmias: Secondary | ICD-10-CM | POA: Diagnosis not present

## 2020-09-06 DIAGNOSIS — R058 Other specified cough: Secondary | ICD-10-CM | POA: Diagnosis not present

## 2020-09-06 DIAGNOSIS — R0600 Dyspnea, unspecified: Secondary | ICD-10-CM | POA: Diagnosis not present

## 2020-09-06 DIAGNOSIS — F17210 Nicotine dependence, cigarettes, uncomplicated: Secondary | ICD-10-CM

## 2020-09-06 MED ORDER — MOMETASONE FURO-FORMOTEROL FUM 100-5 MCG/ACT IN AERO: INHALATION_SPRAY | RESPIRATORY_TRACT | 0 refills | Status: DC

## 2020-09-06 MED ORDER — PREDNISONE 10 MG PO TABS: ORAL_TABLET | ORAL | 0 refills | Status: DC

## 2020-09-06 NOTE — Patient Instructions (Addendum)
Prednisone 10 mg take  4 each am x 2 days,   2 each am x 2 days,  1 each am x 2 days and stop    Should the cough not improve to your satisfaction, add dulera 100 Take 2 puffs first thing in am and then another 2 puffs about 12 hours later.   Work on inhaler technique:  relax and gently blow all the way out then take a nice smooth deep breath back in, triggering the inhaler at same time you start breathing in.  Hold for up to 5 seconds if you can. Blow out thru nose. Rinse and gargle with water when done      Pt informed of the seriousness of COVID 19 infection as a direct risk to lung health  and safey and to close contacts and should continue to wear a facemask in public and minimize exposure to public locations but especially avoid any area or activity where non-close contacts are not observing distancing or wearing an appropriate face mask.  I strongly recommended she take either of the vaccines available through local drugstores based on updated information on millions of Americans treated with the [place] products  which have proven both safe and  effective even against the new delta variant.    The key is to stop smoking completely before smoking completely stops you!  Please remember to go to the lab department @ [facility] for your tests - we will call you with the results when they are available.       Please schedule a follow up office visit in 6 weeks, call sooner if needed with all medications /inhalers/ solutions in hand so we can verify exactly what you are taking. This includes all medications from all doctors and over the counters        Late add:  Did not go for labs as rec >  rec cards eval and let them decide what labs she needs

## 2020-09-06 NOTE — Assessment & Plan Note (Addendum)
Onset Dec 2019 see ER note [facility] c/w flu  -  Cyclical cough rx  29/11/9164  > much better 09/06/2020  -   09/06/2020 rec if recurs on gerd rx start dulera 100 2bid  ddx still includes cough variant asthma esp based on report of improvement on saba so if flares again while on gerd rx rec  Prednisone 10 mg take  4 each am x 2 days,   2 each am x 2 days,  1 each am x 2 days and stop  Then add dulera 100 2bid   - The proper method of use, as well as anticipated side effects, of a metered-dose inhaler were discussed and demonstrated to the patient using teach back method.   Return in 6 weeks with all meds in hand using a trust but verify approach to confirm accurate Medication  Reconciliation The principal here is that until we are certain that the  patients are doing what we've asked, it makes no sense to ask them to do more.

## 2020-09-06 NOTE — Assessment & Plan Note (Addendum)
Counseled re importance of smoking cessation but did not meet time criteria for separate billing    Again advised: Pt informed of the seriousness of COVID 19 infection as a direct risk to lung health  and safey and to close contacts and should continue to wear a facemask in public and minimize exposure to public locations but especially avoid any area or activity where non-close contacts are not observing distancing or wearing an appropriate face mask.  I strongly recommended she take either of the vaccines available through local drugstores based on updated information on millions of Americans treated with the [place] products  which have proven both safe and  effective even against the new delta variant.

## 2020-09-06 NOTE — Progress Notes (Signed)
f  Lauren Banks, female    DOB: Mar 16, 1966,    MRN: [id number]   Brief patient profile:  69 yobf active smoker healthy until 20s pattern was freq springtime runny nose and chest tightness better with steroid shot never needed  Inhaler  with allergy eval at [facility] clinic in 2018 "never got any results" but symptoms more chronic p flu like illness 10/2018 eval in ER at [facility] dx as Flu and acute symptoms all resolved by coughing ever since so referred to pulmonary clinic in [facility]  08/07/2020 by Angelina Ok NP       History of Present Illness  08/07/2020  Pulmonary/ 1st office eval/ Ilya Neely / [facility]  Chief Complaint  Patient presents with  . Consult    non-productive and productive cough with clear/light yellow phlegm  Dyspnea:  Not limited by breathing from desired activities   Cough: worse in heat and humidity daily cough to point of vomit / mouth dry/ caffeine makes it worse  / min production  Assoc with urinary incont/  Sleep: immediately on lying down bed is flat/ one pillow  SABA use:  Helps a smidge /the neb better and lasts up to 4 hours codiene works/ not sure about prednisone Cough syncope x one  rec Take delsym two tsp every 12 hours and supplement if needed with  Tylenol #3   up to 1-2 every 4 hours to suppress the urge to cough. . Once you have eliminated the cough for 3 straight days try reducing the Tylenol #3 first,  then the delsym as tolerated.    Prednisone 10 mg take  4 each am x 2 days,   2 each am x 2 days,  1 each am x 2 days and stop (this is to eliminate allergies and inflammation from coughing) Protonix (pantoprazole) Take 30-60 min before first meal of the day and Pepcid 20 mg one after supper until return to office GERD diet  The key is to stop smoking completely before smoking completely stops you! I very strongly recommend you get the moderna or pfizer vaccine    Please schedule a follow up office visit in 4 weeks, sooner if needed  with all  medications /inhalers/ solutions in hand so we can verify exactly what you are taking. This includes all medications from all doctors and over the counters Add: needs cxr on return unless done at [facility]        09/06/2020  f/u ov/[place] office/Sanna Porcaro re: uacs with cyclical cough / smoker  Chief Complaint  Patient presents with  . Follow-up    no complaints currently  Dyspnea:  Not limited by breathing from desired activities   Cough: cold /hot  Sleeping: sleeping flat/ one -2  SABA use: neb twice weekly 02: none    No obvious day to day or daytime variability or assoc excess/ purulent sputum or mucus plugs or hemoptysis or cp or chest tightness, subjective wheeze or overt sinus or hb symptoms.   Sleeping better now  without nocturnal  or early am exacerbation  of respiratory  c/o's or need for noct saba. Also denies any obvious fluctuation of symptoms with weather or environmental changes or other aggravating or alleviating factors except as outlined above   No unusual exposure hx or h/o childhood pna/ asthma or knowledge of premature birth.  Current Allergies, Complete Past Medical History, Past Surgical History, Family History, and Social History were reviewed in [employer] record.  ROS  The  following are not active complaints unless bolded Hoarseness, sore throat, dysphagia, dental problems, itching, sneezing,  nasal congestion or discharge of excess mucus or purulent secretions, ear ache,   fever, chills, sweats, unintended wt loss or wt gain, classically pleuritic or exertional cp,  orthopnea pnd or arm/hand swelling  or leg swelling, presyncope, palpitations, abdominal pain, anorexia, nausea, vomiting, diarrhea  or change in bowel habits or change in bladder habits, change in stools or change in urine, dysuria, hematuria,  rash, arthralgias, visual complaints, headache, numbness, weakness or ataxia or problems with walking or coordination,  change  in mood = anxious or  memory.        Current Meds  Medication Sig  . albuterol (VENTOLIN HFA) 108 (90 Base) MCG/ACT inhaler   . buPROPion (WELLBUTRIN XL) 300 MG 24 hr tablet Take 300 mg by mouth daily.  Marland Kitchen escitalopram (LEXAPRO) 20 MG tablet Take 20 mg by mouth daily.  . famotidine (PEPCID) 20 MG tablet One after supper  . hydrochlorothiazide (HYDRODIURIL) 25 MG tablet Take 25 mg by mouth daily.   Marland Kitchen ipratropium-albuterol (DUONEB) 0.5-2.5 (3) MG/3ML SOLN SMARTSIG:3 Milliliter(s) Via Nebulizer Every 6 Hours  . levocetirizine (XYZAL) 5 MG tablet Take 5 mg by mouth every evening.  . metoprolol succinate (TOPROL-XL) 25 MG 24 hr tablet Take 25 mg by mouth daily.  . pantoprazole (PROTONIX) 40 MG tablet Take 1 tablet (40 mg total) by mouth daily. Take 30-60 min before first meal of the day             Past Medical History:  Diagnosis Date  . Hyperlipemia   . Hypertension       Objective:     Wt Readings from Last 3 Encounters:  09/06/20 255 lb (115.7 kg)  08/07/20 254 lb 3.2 oz (115.3 kg)  10/17/18 259 lb (117.5 kg)     Vital signs reviewed - Note on arrival 09/06/2020  02 sats  100% on RA  amb obese bf unusual affect   HEENT : pt wearing mask not removed for exam due to covid -19 concerns.    NECK :  without JVD/Nodes/TM/ nl carotid upstrokes bilaterally   LUNGS: no acc muscle use,  Nl contour chest which is clear to A and P bilaterally without cough on insp or exp maneuvers   CV: Regular rhythm with extra beat after each systole  no s3 or murmur or increase in P2, and no edema   ABD:  soft and nontender with nl inspiratory excursion in the supine position. No bruits or organomegaly appreciated, bowel sounds nl  MS:  Nl gait/ ext warm without deformities, calf tenderness, cyanosis or clubbing No obvious joint restrictions   SKIN: warm and dry without lesions    NEURO:  alert, approp, nl sensorium with  no motor or cerebellar deficits apparent.         ekg 09/06/2020    sr with intermittent ventricular bigeminy    Labs ordered 09/06/2020  :  Bmet, tsh, bnp  Cbc with diff     Assessment

## 2020-09-07 ENCOUNTER — Encounter: Payer: Self-pay | Admitting: Internal Medicine

## 2020-09-07 DIAGNOSIS — I498 Other specified cardiac arrhythmias: Secondary | ICD-10-CM | POA: Insufficient documentation

## 2020-09-07 NOTE — Assessment & Plan Note (Signed)
Seen on ekg 09/06/2020 > labs advised but did not go > rec cards eval           Each maintenance medication was reviewed in detail including emphasizing most importantly the difference between maintenance and prns and under what circumstances the prns are to be triggered using an action plan format where appropriate.  Total time for H and P, chart review, counseling, teaching device and generating customized AVS unique to this office visit / charting  30 min

## 2020-09-09 ENCOUNTER — Telehealth: Payer: Self-pay | Admitting: *Deleted

## 2020-09-09 DIAGNOSIS — I498 Other specified cardiac arrhythmias: Secondary | ICD-10-CM

## 2020-09-09 NOTE — Telephone Encounter (Signed)
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

## 2020-09-09 NOTE — Telephone Encounter (Signed)
-----   Message from Tanda Rockers, MD sent at 09/07/2020  5:14 AM EDT -----  Did not go for labs as rec >  rec cards eval and let them decide what labs she needs

## 2020-09-09 NOTE — Telephone Encounter (Signed)
Called and spoke with pt letting her know that we were going to refer her to a cardiologist and she verbalized understanding. Order has been placed. Nothing further needed.

## 2020-09-09 NOTE — Telephone Encounter (Signed)
Spoke with the pt  She states she tried going to the lab but was advised it was going to be a 4 hour wait the day she went  She plans on going to [facility] to complete labs today or tomorrow  She does not have an appt with cards- did you want to refer her to them?  Please advise, thanks!

## 2020-09-10 ENCOUNTER — Other Ambulatory Visit: Payer: Self-pay

## 2020-09-10 ENCOUNTER — Other Ambulatory Visit (HOSPITAL_COMMUNITY)
Admission: RE | Admit: 2020-09-10 | Discharge: 2020-09-10 | Disposition: A | Discharge status: Home / Self Care | Payer: BLUE CROSS/BLUE SHIELD | Source: Ambulatory Visit | Attending: Internal Medicine | Admitting: Internal Medicine

## 2020-09-10 DIAGNOSIS — R0600 Dyspnea, unspecified: Secondary | ICD-10-CM | POA: Diagnosis not present

## 2020-09-10 LAB — BASIC METABOLIC PANEL
Anion gap: 12 (ref 5–15)
BUN: 9 mg/dL (ref 6–20)
CO2: 25 mmol/L (ref 22–32)
Calcium: 9.6 mg/dL (ref 8.9–10.3)
Chloride: 96 mmol/L — ABNORMAL LOW (ref 98–111)
Creatinine, Ser: 0.91 mg/dL (ref 0.44–1.00)
GFR, Estimated: >60 mL/min (ref >60)
Glucose, Bld: 110 mg/dL — ABNORMAL HIGH (ref 70–99)
Potassium: 3 mmol/L — ABNORMAL LOW (ref 3.5–5.1)
Sodium: 133 mmol/L — ABNORMAL LOW (ref 135–145)

## 2020-09-10 LAB — CBC WITH DIFFERENTIAL/PLATELET
Abs Immature Granulocytes: 0.09 10*3/uL — ABNORMAL HIGH (ref 0.00–0.07)
Basophils Absolute: 0.1 10*3/uL (ref 0.0–0.1)
Basophils Relative: 1 %
Eosinophils Absolute: 0.1 10*3/uL (ref 0.0–0.5)
Eosinophils Relative: 0 %
HCT: 42.4 % (ref 36.0–46.0)
Hemoglobin: 13.5 g/dL (ref 12.0–15.0)
Immature Granulocytes: 1 %
Lymphocytes Relative: 20 %
Lymphs Abs: 2.6 10*3/uL (ref 0.7–4.0)
MCH: 27.1 pg (ref 26.0–34.0)
MCHC: 31.8 g/dL (ref 30.0–36.0)
MCV: 85 fL (ref 80.0–100.0)
Monocytes Absolute: 0.6 10*3/uL (ref 0.1–1.0)
Monocytes Relative: 5 %
Neutro Abs: 9.4 10*3/uL — ABNORMAL HIGH (ref 1.7–7.7)
Neutrophils Relative %: 73 %
Platelets: 372 10*3/uL (ref 150–400)
RBC: 4.99 MIL/uL (ref 3.87–5.11)
RDW: 13.8 % (ref 11.5–15.5)
WBC: 12.7 10*3/uL — ABNORMAL HIGH (ref 4.0–10.5)
nRBC: 0 % (ref 0.0–0.2)

## 2020-09-10 LAB — BRAIN NATRIURETIC PEPTIDE: B Natriuretic Peptide: 244 pg/mL — ABNORMAL HIGH (ref 0.0–100.0)

## 2020-09-10 LAB — TSH: TSH: 1.726 u[IU]/mL (ref 0.350–4.500)

## 2020-09-12 NOTE — Progress Notes (Signed)
Tried calling the pt and there was no answer and her VM not set up Will call back  

## 2020-09-13 ENCOUNTER — Other Ambulatory Visit: Payer: Self-pay | Admitting: Internal Medicine

## 2020-09-13 DIAGNOSIS — E876 Hypokalemia: Secondary | ICD-10-CM

## 2020-09-13 MED ORDER — POTASSIUM CHLORIDE CRYS ER 20 MEQ PO TBCR: EXTENDED_RELEASE_TABLET | ORAL | 0 refills | Status: DC

## 2020-09-13 NOTE — Progress Notes (Signed)
Spoke with the pt and notified of results/rescs. She verbalized understanding. Rx was sent and bmet order placed.

## 2020-10-01 ENCOUNTER — Ambulatory Visit: Payer: BLUE CROSS/BLUE SHIELD | Admitting: Internal Medicine

## 2020-10-02 ENCOUNTER — Other Ambulatory Visit: Payer: Self-pay

## 2020-10-02 ENCOUNTER — Encounter: Payer: Self-pay | Admitting: Internal Medicine

## 2020-10-02 ENCOUNTER — Ambulatory Visit: Payer: BLUE CROSS/BLUE SHIELD | Admitting: Internal Medicine

## 2020-10-02 VITALS — BP 148/80 | HR 81 | Ht 70.0 in | Wt 250.0 lb

## 2020-10-02 DIAGNOSIS — I10 Essential (primary) hypertension: Secondary | ICD-10-CM | POA: Diagnosis not present

## 2020-10-02 DIAGNOSIS — R079 Chest pain, unspecified: Secondary | ICD-10-CM

## 2020-10-02 DIAGNOSIS — I493 Ventricular premature depolarization: Secondary | ICD-10-CM | POA: Diagnosis not present

## 2020-10-02 MED ORDER — METOPROLOL SUCCINATE ER 25 MG PO TB24
25.0000 mg | ORAL_TABLET | Freq: Two times a day (BID) | ORAL | 1 refills | Status: DC
Start: 2020-10-02 — End: 2020-12-12

## 2020-10-02 NOTE — Progress Notes (Signed)
New Outpatient Visit Date: 10/02/2020  Referring Provider: Tanda Rockers, [address] [place],  [place] [address]  Chief Complaint: Cough  HPI:  Lauren Banks is a 54 y.o. female who is being seen today for the evaluation of PVCs at the request of Dr. Melvyn Novas. She has a history of hypertension and hyperlipidemia.  She was seen by Dr. Melvyn Novas last month for evaluation of cough, at which time she was noted to have PVCs.  She was treated with prednisone and feels like her cough and shortness of breath have improved a lot.  She reports a history of palpitations in the past but has not felt anything for at least 2 months.  She has previously attributed palpitations to panic attacks.  She reports intermittent chest pain that has been longstanding.  She reports initially having been diagnosed with pleurisy about 20 years ago and using arthritis BC powder with some improvement.  Typically, the chest pain begins to subside as soon as she takes the medication.  The chest pain is sharp and left-sided, often radiating under the breast.  There are no associated symptoms.  The pain is not exertional.  Lauren Banks has experienced two-pillow orthopnea and increased coughing when lying down.  She sleeps on 2 pillows who was recently advised to raise raise the head of her bed to help with her cough by Dr. Melvyn Novas.  She denies edema.  She has not undergone heart testing in the past.  --------------------------------------------------------------------------------------------------  Cardiovascular History & Procedures: Cardiovascular Problems:  PVC's  Chest pain  Risk Factors:  Hypertension, hyperlipidemia, obesity, and tobacco abuse  Cath/PCI:  None  CV Surgery:  None  EP Procedures and Devices:  None  Non-Invasive Evaluation(s):  None  Recent CV Pertinent Labs: Lab Results  Component Value Date   BNP 244.0 (H) 09/10/2020   K 3.0 (L) 09/10/2020   BUN 9 09/10/2020   CREATININE 0.91  09/10/2020   CREATININE 0.71 01/18/2014    --------------------------------------------------------------------------------------------------  Past Medical History:  Diagnosis Date  . Hyperlipemia   . Hypertension     Past Surgical History:  Procedure Laterality Date  . ABDOMINAL HYSTERECTOMY  10/13/2011   Procedure: HYSTERECTOMY ABDOMINAL;  Surgeon: Jonnie Kind, MD;  Location: [facility];  Service: Gynecology;  Laterality: N/A;    Current Meds  Medication Sig  . albuterol (VENTOLIN HFA) 108 (90 Base) MCG/ACT inhaler   . buPROPion (WELLBUTRIN XL) 300 MG 24 hr tablet Take 300 mg by mouth daily.  Marland Kitchen escitalopram (LEXAPRO) 20 MG tablet Take 20 mg by mouth daily.  . famotidine (PEPCID) 20 MG tablet One after supper  . ipratropium-albuterol (DUONEB) 0.5-2.5 (3) MG/3ML SOLN SMARTSIG:3 Milliliter(s) Via Nebulizer Every 6 Hours  . levocetirizine (XYZAL) 5 MG tablet Take 5 mg by mouth every evening.  . metoprolol succinate (TOPROL-XL) 25 MG 24 hr tablet Take 25 mg by mouth daily.  . mometasone-formoterol (DULERA) 100-5 MCG/ACT AERO Take 2 puffs first thing in am and then another 2 puffs about 12 hours later.  . pantoprazole (PROTONIX) 40 MG tablet Take 1 tablet (40 mg total) by mouth daily. Take 30-60 min before first meal of the day  . potassium chloride SA (KLOR-CON M20) 20 MEQ tablet 2 daily x 3 days, then 1 daily    Allergies: Penicillins  Social History   Tobacco Use  . Smoking status: Current Every Day Smoker    Packs/day: 1.00    Years: 30.00    Pack years: 30.00  Types: Cigarettes  . Smokeless tobacco: Never Used  . Tobacco comment: smokes 1/2 of a pack per day 09/06/2020  Substance Use Topics  . Alcohol use: No  . Drug use: Yes    Frequency: 7.0 times per week    Types: Marijuana    Family History  Problem Relation Age of Onset  . Cancer Mother        breast- around age 51, ovarian- around age100  . Hypertension Mother   . Alzheimer's disease Mother   .  Hypertension Father   . Diabetes Father   . Kidney disease Father   . Gout Father   . Stroke Maternal Grandmother   . Kidney disease Maternal Grandmother   . Stroke Paternal Grandmother   . Diabetes Sister   . Obesity Sister   . Heart disease Sister   . Hypertension Brother   . Cancer Brother        lymphnode  . Gout Brother   . Cancer Maternal Aunt   . Anesthesia problems Neg Hx     Review of Systems: A 12-system review of systems was performed and was negative except as noted in the HPI.  --------------------------------------------------------------------------------------------------  Physical Exam: BP (!) 148/80 (BP Location: Right Arm, Patient Position: Sitting, Cuff Size: Large)   Pulse 81   Ht 5\' 10"  (1.778 m)   Wt 250 lb (113.4 kg)   LMP 09/21/2011   SpO2 98%   BMI 35.87 kg/m   General: NAD. HEENT: No conjunctival pallor or scleral icterus. Facemask in place. Neck: Supple without lymphadenopathy, thyromegaly, JVD, or HJR. No carotid bruit. Lungs: Normal work of breathing. Clear to auscultation bilaterally without wheezes or crackles. Heart: Regular rate and rhythm with frequent extrasystoles.  No murmurs, rubs, or gallops.  Unable to assess PMI due to body habitus. Abd: Bowel sounds present. Soft, NT/ND.  Unable to assess HSM due to body habitus. Ext: No lower extremity edema. Radial, PT, and DP pulses are 2+ bilaterally Skin: Warm and dry without rash. Neuro: CNIII-XII intact. Strength and fine-touch sensation intact in upper and lower extremities bilaterally. Psych: Normal mood and affect.  EKG: Normal sinus rhythm with frequent PVCs in a pattern of bigeminy.  Left atrial enlargement.  Right bundle branch block.  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  It is possible that previously noted hypokalemia could be contributing to this, though Lauren Banks is now on potassium supplementation and continues to exhibit frequent PVCs today.  Recent TSH was normal.  BNP was noted to be elevated, raising potential for cardiomyopathy.  I have recommended that we obtain a transthoracic echocardiogram.  If LVEF is normal, noninvasive ischemia testing will need to be pursued.  If there is evidence of significant cardiomyopathy, I would instead favor cardiac catheterization.  In the meantime, we will increase metoprolol succinate to 25 mg twice  daily.  Potassium level should be rechecked when Lauren Banks follows up with Dr. Melvyn Novas later this month.  Hypertension: Blood pressure suboptimally controlled today.  We will increase metoprolol succinate to 25 mg twice daily.  Chest pain: Atypical in timing and quality.  We will begin with an echocardiogram, as outlined above, to be followed by noninvasive versus invasive ischemia testing based on results of echo.  Tobacco use: Smoking cessation was encouraged.  Follow-up: Return to clinic shortly after completion of echocardiogram.  Nelva Bush, MD 10/02/2020 9:11 AM

## 2020-10-02 NOTE — Patient Instructions (Signed)
Medication Instructions:  Your physician has recommended you make the following change in your medication:  1- INCREASE Metoprolol succinate to 25 mg (1 tablet) by mouth 2 times a day.  *If you need a refill on your cardiac medications before your next appointment, please call your pharmacy*  Lab Work: none If you have labs (blood work) drawn today and your tests are completely normal, you will receive your results only by: Marland Kitchen MyChart Message (if you have MyChart) OR . A paper copy in the mail If you have any lab test that is abnormal or we need to change your treatment, we will call you to review the results.  Testing/Procedures: Your physician has requested that you have an echocardiogram. Echocardiography is a painless test that uses sound waves to create images of your heart. It provides your doctor with information about the size and shape of your heart and how well your heart's chambers and valves are working. This procedure takes approximately one hour. There are no restrictions for this procedure. There is a possibility that an IV may need to be started during your test to inject an image enhancing agent. This is done to obtain more optimal pictures of your heart. Therefore we ask that you do at least drink some water prior to coming in to hydrate your veins.    Follow-Up: At [facility], you and your health needs are our priority.  As part of our continuing mission to provide you with exceptional heart care, we have created designated Provider Care Teams.  These Care Teams include your primary Cardiologist (physician) and Advanced Practice Providers (APPs -  Physician Assistants and Nurse Practitioners) who all work together to provide you with the care you need, when you need it.  We recommend signing up for the patient portal called "MyChart".  Sign up information is provided on this After Visit Summary.  MyChart is used to connect with patients for Virtual Visits (Telemedicine).   Patients are able to view lab/test results, encounter notes, upcoming appointments, etc.  Non-urgent messages can be sent to your provider as well.   To learn more about what you can do with MyChart, go to [email or web address].    Your next appointment:   After the echo.  The format for your next appointment:   In Person  Provider:   You may see DR Harrell Gave END or one of the following Advanced Practice Providers on your designated Care Team:    Murray Hodgkins, NP  Christell Faith, PA-C  Marrianne Mood, PA-C  Cadence [place], [place]  Laurann Montana, NP    Echocardiogram An echocardiogram is a procedure that uses painless sound waves (ultrasound) to produce an image of the heart. Images from an echocardiogram can provide important information about:  Signs of coronary artery disease (CAD).  Aneurysm detection. An aneurysm is a weak or damaged part of an artery wall that bulges out from the normal force of blood pumping through the body.  Heart size and shape. Changes in the size or shape of the heart can be associated with certain conditions, including heart failure, aneurysm, and CAD.  Heart muscle function.  Heart valve function.  Signs of a past heart attack.  Fluid buildup around the heart.  Thickening of the heart muscle.  A tumor or infectious growth around the heart valves. Tell a health care provider about:  Any allergies you have.  All medicines you are taking, including vitamins, herbs, eye drops, creams, and over-the-counter medicines.  Any blood disorders  you have.  Any surgeries you have had.  Any medical conditions you have.  Whether you are pregnant or may be pregnant. What are the risks? Generally, this is a safe procedure. However, problems may occur, including:  Allergic reaction to dye (contrast) that may be used during the procedure. What happens before the procedure? No specific preparation is needed. You may eat and drink  normally. What happens during the procedure?   An IV tube may be inserted into one of your veins.  You may receive contrast through this tube. A contrast is an injection that improves the quality of the pictures from your heart.  A gel will be applied to your chest.  A wand-like tool (transducer) will be moved over your chest. The gel will help to transmit the sound waves from the transducer.  The sound waves will harmlessly bounce off of your heart to allow the heart images to be captured in real-time motion. The images will be recorded on a computer. The procedure may vary among health care providers and hospitals. What happens after the procedure?  You may return to your normal, everyday life, including diet, activities, and medicines, unless your health care provider tells you not to do that. Summary  An echocardiogram is a procedure that uses painless sound waves (ultrasound) to produce an image of the heart.  Images from an echocardiogram can provide important information about the size and shape of your heart, heart muscle function, heart valve function, and fluid buildup around your heart.  You do not need to do anything to prepare before this procedure. You may eat and drink normally.  After the echocardiogram is completed, you may return to your normal, everyday life, unless your health care provider tells you not to do that. This information is not intended to replace advice given to you by your health care provider. Make sure you discuss any questions you have with your health care provider. Document Revised: 02/09/2019 Document Reviewed: 11/21/2016 Elsevier Patient Education  [place].

## 2020-10-08 ENCOUNTER — Other Ambulatory Visit: Payer: Self-pay | Admitting: Internal Medicine

## 2020-10-08 DIAGNOSIS — R058 Other specified cough: Secondary | ICD-10-CM

## 2020-10-18 ENCOUNTER — Ambulatory Visit: Payer: BLUE CROSS/BLUE SHIELD | Admitting: Internal Medicine

## 2020-10-23 ENCOUNTER — Ambulatory Visit (INDEPENDENT_AMBULATORY_CARE_PROVIDER_SITE_OTHER): Payer: BLUE CROSS/BLUE SHIELD

## 2020-10-23 ENCOUNTER — Other Ambulatory Visit: Payer: Self-pay

## 2020-10-23 DIAGNOSIS — R079 Chest pain, unspecified: Secondary | ICD-10-CM | POA: Diagnosis not present

## 2020-10-23 DIAGNOSIS — I493 Ventricular premature depolarization: Secondary | ICD-10-CM

## 2020-10-23 LAB — ECHOCARDIOGRAM COMPLETE
AR max vel: 2.73 cm2
AV Area VTI: 2.75 cm2
AV Area mean vel: 2.79 cm2
AV Mean grad: 6 mmHg
AV Peak grad: 9.9 mmHg
Ao pk vel: 1.57 m/s
Area-P 1/2: 5.54 cm2
Calc EF: 39.3 %
P 1/2 time: 551 msec
S' Lateral: 4.85 cm
Single Plane A2C EF: 39.5 %
Single Plane A4C EF: 39.8 %

## 2020-10-24 ENCOUNTER — Telehealth: Payer: Self-pay | Admitting: *Deleted

## 2020-10-24 MED ORDER — FUROSEMIDE 20 MG PO TABS: 20.0000 mg | ORAL_TABLET | Freq: Every day | ORAL | 2 refills | Status: DC

## 2020-10-24 MED ORDER — LOSARTAN POTASSIUM 25 MG PO TABS: 25.0000 mg | ORAL_TABLET | Freq: Every day | ORAL | 1 refills | Status: DC

## 2020-10-24 NOTE — Telephone Encounter (Signed)
Instead of restarting HCTZ, I suggest starting furosemide 20 mg daily in addition to losartan 25 mg daily.  We will need to check a BMP in ~2 weeks.  This can be done at the time of office visit if she can be scheduled to see me or an APP in that timeframe.  Thanks.  Nelva Bush, MD [facility]

## 2020-10-24 NOTE — Telephone Encounter (Signed)
The patient has been notified of the result and verbalized understanding.  All questions (if any) were answered.  Dr. Melvyn Novas took patient off hydrochlorothiazide about 1 month ago. Now her legs and ankles are swelling some.  She would like to know if she should resume the HCTZ. Advised I will find out from Dr End his thoughts on that.  Patient agreeable to start losartan 25 mg and Rx sent to pharmacy. Scheduled patient to see Dr End on 11/06/20 and patient aware of date and time.

## 2020-10-24 NOTE — Telephone Encounter (Signed)
-----   Message from Nelva Bush, MD sent at 10/23/2020  7:22 PM EST ----- Please let Lauren Banks know that her echocardiogram shows moderate weakening of her heart's pumping function.  There is also suggestion of fluid retention.  I recommend that we start losartan 25 mg daily and have her come in at her earliest convenience to reassess her symptoms and discuss proceeding with catheterization to further evaluate her cardiomyopathy.

## 2020-10-24 NOTE — Telephone Encounter (Signed)
Spoke to patient and she verbalized understanding of adding furosemide 20 mg daily. She is aware she will need lab work at her follow up appointment. Rx sent to pharmacy.

## 2020-10-29 NOTE — H&P (View-Only) (Signed)
Cardiology Office Note    Date:  10/30/2020   ID:  Lauren Banks, DOB 1966/10/11, MRN [id number]  PCP:  Erasmo Downer, NP  Cardiologist:  Yvonne Kendall, MD  Electrophysiologist:  None   Chief Complaint: Follow up  History of Present Illness:   Lauren Banks is a 54 y.o. female with history of recently diagnosed acute combined systolic and diastolic CHF with an EF of 35 to 40% by echo on 10/23/2020, frequent PVCs, HTN, HLD, and tobacco use who presents for follow-up of her echo.  She was evaluated by Dr. Okey Dupre as a new patient on 10/02/2020 at the request of her pulmonologist for evaluation of PVCs.  She had been evaluated by pulmonology for cough and shortness of breath that was treated with prednisone with symptomatic improvement.  At her visit with cardiology she reported a prior history of palpitations and previously attributed these to panic attacks.  She also reported intermittent longstanding chest pain.  She reported a prior diagnosis of pleurisy approximately 20 years ago with symptoms of chest discomfort noted to improve with BC powder.  Her pain was described as sharp and left-sided off and underneath the breast.  There were no associated symptoms and her pain was nonexertional.  EKG at her cardiology visit showed normal sinus rhythm with frequent PVCs in a pattern of bigeminy with atrial enlargement and an underlying right bundle branch block.  It was noted that she had normal thyroid function and was on potassium supplementation for previously noted hypokalemia.  She underwent 2D echo on 10/23/2020 which showed an EF of 35 to 40%, global hypokinesis, grade 2 diastolic dysfunction, mildly reduced RV systolic function with a mildly enlarged RV cavity size, mild to moderate mitral regurgitation, mild to moderate aortic valve insufficiency with moderate aortic valve sclerosis with no evidence of aortic valve stenosis, and a right atrial pressure of 15 mmHg.  Given these findings, she was  started on losartan 25 mg daily.  It was noted post echo that her HCTZ had been discontinued by pulmonology and in this setting she noted worsening lower extremity edema.  Given this, she was placed on Lasix 20 mg daily in addition to the above-mentioned losartan.  She comes in doing well from a cardiac perspective.  She notes that since she was last seen her symptoms have significantly improved.  She denies any chest pain, palpitations, dyspnea, dizziness, presyncope, or syncope.  Following her visit earlier this month she did note some significant lower extremity swelling and began to take an old diuretic she had at home that she indicates started with a B, possibly Bumex.  She was taking this in addition to the recently recommended furosemide.  She was taking both of these diuretics for approximately 2 weeks though for the past 2 weeks she has only been taking Lasix in addition to the previously recommended losartan.  Since being on diuretic therapy she notes no further lower extremity swelling, abdominal distention, worsening orthopnea, PND, or early satiety.   Labs independently reviewed: 09/2020 - potassium 3.0, BUN 9, serum creatinine 0.91, BNP 244, Hgb 13.5, PLT 372, TSH normal  Past Medical History:  Diagnosis Date  . Chronic combined systolic (congestive) and diastolic (congestive) heart failure (HCC)   . Frequent PVCs   . Hyperlipemia   . Hypertension     Past Surgical History:  Procedure Laterality Date  . ABDOMINAL HYSTERECTOMY  10/13/2011   Procedure: HYSTERECTOMY ABDOMINAL;  Surgeon: Tilda Burrow, MD;  Location: [facility];  Service: Gynecology;  Laterality: N/A;    Current Medications: Current Meds  Medication Sig  . albuterol (VENTOLIN HFA) 108 (90 Base) MCG/ACT inhaler   . buPROPion (WELLBUTRIN XL) 300 MG 24 hr tablet Take 300 mg by mouth daily.  Marland Kitchen escitalopram (LEXAPRO) 20 MG tablet Take 20 mg by mouth daily.  . famotidine (PEPCID) 20 MG tablet One after supper  .  furosemide (LASIX) 20 MG tablet Take 1 tablet (20 mg total) by mouth daily.  Marland Kitchen ipratropium-albuterol (DUONEB) 0.5-2.5 (3) MG/3ML SOLN SMARTSIG:3 Milliliter(s) Via Nebulizer Every 6 Hours  . levocetirizine (XYZAL) 5 MG tablet Take 5 mg by mouth every evening.  Marland Kitchen losartan (COZAAR) 25 MG tablet Take 1 tablet (25 mg total) by mouth daily.  . metoprolol succinate (TOPROL-XL) 25 MG 24 hr tablet Take 1 tablet (25 mg total) by mouth in the morning and at bedtime.  . mometasone-formoterol (DULERA) 100-5 MCG/ACT AERO Take 2 puffs first thing in am and then another 2 puffs about 12 hours later.  . pantoprazole (PROTONIX) 40 MG tablet Take 1 tablet (40 mg total) by mouth daily. Take 30-60 min before first meal of the day  . [DISCONTINUED] potassium chloride SA (KLOR-CON M20) 20 MEQ tablet 2 daily x 3 days, then 1 daily    Allergies:   Penicillins   Social History   Socioeconomic History  . Marital status: Married    Spouse name: Not on file  . Number of children: Not on file  . Years of education: Not on file  . Highest education level: Not on file  Occupational History  . Not on file  Tobacco Use  . Smoking status: Current Every Day Smoker    Packs/day: 1.00    Years: 30.00    Pack years: 30.00    Types: Cigarettes  . Smokeless tobacco: Never Used  . Tobacco comment: smokes 1/2 of a pack per day 09/06/2020  Substance and Sexual Activity  . Alcohol use: No  . Drug use: Yes    Frequency: 7.0 times per week    Types: Marijuana  . Sexual activity: Yes    Birth control/protection: None, Surgical  Other Topics Concern  . Not on file  Social History Narrative  . Not on file   Social Determinants of Health   Financial Resource Strain: Not on file  Food Insecurity: Not on file  Transportation Needs: Not on file  Physical Activity: Not on file  Stress: Not on file  Social Connections: Not on file     Family History:  The patient's family history includes Alzheimer's disease in her  mother; Cancer in her brother, maternal aunt, and mother; Diabetes in her father and sister; Gout in her brother and father; Heart disease in her sister; Hypertension in her brother, father, and mother; Kidney disease in her father and maternal grandmother; Obesity in her sister; Stroke in her maternal grandmother and paternal grandmother. There is no history of Anesthesia problems.  ROS:   Review of Systems  Constitutional: Negative for chills, diaphoresis, fever, malaise/fatigue and weight loss.  HENT: Negative for congestion.   Eyes: Negative for discharge and redness.  Respiratory: Negative for cough, sputum production, shortness of breath and wheezing.   Cardiovascular: Negative for chest pain, palpitations, orthopnea, claudication, leg swelling and PND.  Gastrointestinal: Negative for abdominal pain, heartburn, nausea and vomiting.  Musculoskeletal: Negative for falls and myalgias.       Cramping  Skin: Negative for rash.  Neurological: Negative for dizziness, tingling, tremors, sensory  change, speech change, focal weakness, loss of consciousness and weakness.  Endo/Heme/Allergies: Does not bruise/bleed easily.  Psychiatric/Behavioral: Negative for substance abuse. The patient is not nervous/anxious.   All other systems reviewed and are negative.    EKGs/Labs/Other Studies Reviewed:    Studies reviewed were summarized above. The additional studies were reviewed today:  2D echo 10/23/2020: 1. Left ventricular ejection fraction, by estimation, is 35 to 40%. Left  ventricular ejection fraction by 3D volume is 43 %. The left ventricle has  moderately decreased function. The left ventricle demonstrates global  hypokinesis. Left ventricular  diastolic parameters are consistent with Grade II diastolic dysfunction  (pseudonormalization). The average left ventricular global longitudinal  strain is -10.2 %. The global longitudinal strain is abnormal.  2. Right ventricular systolic  function is mildly reduced. The right  ventricular size is mildly enlarged.  3. The mitral valve is normal in structure. Mild to moderate mitral valve  regurgitation.  4. The aortic valve was not well visualized. Aortic valve regurgitation  is mild to moderate. Mild aortic valve sclerosis is present, with no  evidence of aortic valve stenosis.  5. The inferior vena cava is dilated in size with <50% respiratory  variability, suggesting right atrial pressure of 15 mmHg.   EKG:  EKG is ordered today.  The EKG ordered today demonstrates NSR, 72 bpm, possible left atrial enlargement, RBBB, no acute ST-T changes.  When compared to last tracing frequent PVCs in a pattern of ventricular bigeminy are no longer present.  Recent Labs: 09/10/2020: B Natriuretic Peptide 244.0; BUN 9; Creatinine, Ser 0.91; Hemoglobin 13.5; Platelets 372; Potassium 3.0; Sodium 133; TSH 1.726  Recent Lipid Panel No results found for: CHOL, TRIG, HDL, CHOLHDL, VLDL, LDLCALC, LDLDIRECT  PHYSICAL EXAM:    VS:  BP 114/72 (BP Location: Left Arm, Patient Position: Sitting, Cuff Size: Normal)   Pulse 72   Ht 5' 10" (1.778 m)   Wt 246 lb (111.6 kg)   LMP 09/21/2011   SpO2 98%   BMI 35.30 kg/m   BMI: Body mass index is 35.3 kg/m.  Physical Exam Constitutional:      Appearance: She is well-developed and well-nourished.  HENT:     Head: Normocephalic and atraumatic.  Eyes:     General:        Right eye: No discharge.        Left eye: No discharge.  Neck:     Vascular: No JVD.  Cardiovascular:     Rate and Rhythm: Normal rate and regular rhythm.     Pulses: No midsystolic click and no opening snap.          Dorsalis pedis pulses are 2+ on the right side and 2+ on the left side.       Posterior tibial pulses are 2+ on the right side and 2+ on the left side.     Heart sounds: S1 normal and S2 normal. Heart sounds not distant. Murmur heard.  High-pitched blowing holosystolic murmur is present with a grade of 1/6 at  the apex. No friction rub.  Pulmonary:     Effort: Pulmonary effort is normal. No respiratory distress.     Breath sounds: Normal breath sounds. No decreased breath sounds, wheezing or rales.  Chest:     Chest wall: No tenderness.  Abdominal:     General: There is no distension.     Palpations: Abdomen is soft.     Tenderness: There is no abdominal tenderness.  Musculoskeletal:          General: No edema.     Cervical back: Normal range of motion.  Skin:    General: Skin is warm and dry.     Nails: There is no clubbing or cyanosis.  Neurological:     Mental Status: She is alert and oriented to person, place, and time.  Psychiatric:        Mood and Affect: Mood and affect normal.        Speech: Speech normal.        Behavior: Behavior normal.        Thought Content: Thought content normal.        Judgment: Judgment normal.     Wt Readings from Last 3 Encounters:  10/30/20 246 lb (111.6 kg)  10/02/20 250 lb (113.4 kg)  09/06/20 255 lb (115.7 kg)     ASSESSMENT & PLAN:   1. Acute combined systolic and diastolic CHF: She appears euvolemic with NYHA class II symptoms.  Her weight is down 4 pounds when compared to her initial visit earlier this month.  We had a long discussion regarding her heart failure today including potential etiologies, work-up and treatment of this.  We will schedule her for [facility] with Dr. Saunders Revel to begin with.  Continue Toprol XL, losartan, and Lasix.  It is noted that she reports taking an old medication that she had at home, possibly Bumex in addition to furosemide for 2 weeks since she was last seen.  However, over the past 2 weeks she has been taking furosemide exclusively along with losartan and metoprolol.  I have explained to her that some of her lower extremity cramping noted was likely in the setting of her taking 2 separate diuretics.  If renal function and potassium allow on recheck labs today, look to add spironolactone on 12/30 with a follow up BMP 7 days  thereafter to further optimize GDMT.  Check CMP following addition of losartan and Lasix as well as to reevaluate her hypokalemia.  CHF education. Risks and benefits of cardiac catheterization have been discussed with the patient including risks of bleeding, bruising, infection, kidney damage, stroke, heart attack, urgent need for bypass, injury to limb, and death. The patient understands these risks and is willing to proceed with the procedure. All questions have been answered and concerns listened to.   2. Frequent PVCs: Quiescent on twelve-lead EKG today.  Plan for ischemic evaluation as outlined above.  If she is found to have nonischemic cardiomyopathy we will plan to place a Zio patch following her R/LHC to quantify PVC burden and to assist in further recommendations regarding management of her PVCs and cardiomyopathy.  Check magnesium.  Trend hypokalemia with recommendation to replete to goal 4.0 as indicated.  Recent TSH normal.  3. Hypokalemia: She remains on KCl supplementation.  Check CMP as above.  4. HTN: Blood pressure is well controlled today.  Continue current medical therapy as outlined above.  5. HLD: No lipid panel available for review.  Not currently on a statin.  Check lipid panel and direct LDL for further risk stratification.  6. Tobacco use: Complete cessation is recommended.  Disposition: F/u with Dr. Saunders Revel or an APP in 2 weeks.   Medication Adjustments/Labs and Tests Ordered: Current medicines are reviewed at length with the patient today.  Concerns regarding medicines are outlined above. Medication changes, Labs and Tests ordered today are summarized above and listed in the Patient Instructions accessible in Encounters.   Signed, Christell Faith, PA-C 10/30/2020 2:36 PM  [address] [place], [place] [phone]

## 2020-10-29 NOTE — Progress Notes (Signed)
Cardiology Office Note    Date:  10/30/2020   ID:  Lauren Banks, DOB 1966/10/11, MRN [id number]  PCP:  Lauren Downer, NP  Cardiologist:  Lauren Kendall, MD  Electrophysiologist:  None   Chief Complaint: Follow up  History of Present Illness:   Lauren Banks is a 54 y.o. female with history of recently diagnosed acute combined systolic and diastolic CHF with an EF of 35 to 40% by echo on 10/23/2020, frequent PVCs, HTN, HLD, and tobacco use who presents for follow-up of her echo.  She was evaluated by Dr. Okey Banks as a new patient on 10/02/2020 at the request of her pulmonologist for evaluation of PVCs.  She had been evaluated by pulmonology for cough and shortness of breath that was treated with prednisone with symptomatic improvement.  At her visit with cardiology she reported a prior history of palpitations and previously attributed these to panic attacks.  She also reported intermittent longstanding chest pain.  She reported a prior diagnosis of pleurisy approximately 20 years ago with symptoms of chest discomfort noted to improve with BC powder.  Her pain was described as sharp and left-sided off and underneath the breast.  There were no associated symptoms and her pain was nonexertional.  EKG at her cardiology visit showed normal sinus rhythm with frequent PVCs in a pattern of bigeminy with atrial enlargement and an underlying right bundle branch block.  It was noted that she had normal thyroid function and was on potassium supplementation for previously noted hypokalemia.  She underwent 2D echo on 10/23/2020 which showed an EF of 35 to 40%, global hypokinesis, grade 2 diastolic dysfunction, mildly reduced RV systolic function with a mildly enlarged RV cavity size, mild to moderate mitral regurgitation, mild to moderate aortic valve insufficiency with moderate aortic valve sclerosis with no evidence of aortic valve stenosis, and a right atrial pressure of 15 mmHg.  Given these findings, she was  started on losartan 25 mg daily.  It was noted post echo that her HCTZ had been discontinued by pulmonology and in this setting she noted worsening lower extremity edema.  Given this, she was placed on Lasix 20 mg daily in addition to the above-mentioned losartan.  She comes in doing well from a cardiac perspective.  She notes that since she was last seen her symptoms have significantly improved.  She denies any chest pain, palpitations, dyspnea, dizziness, presyncope, or syncope.  Following her visit earlier this month she did note some significant lower extremity swelling and began to take an old diuretic she had at home that she indicates started with a B, possibly Bumex.  She was taking this in addition to the recently recommended furosemide.  She was taking both of these diuretics for approximately 2 weeks though for the past 2 weeks she has only been taking Lasix in addition to the previously recommended losartan.  Since being on diuretic therapy she notes no further lower extremity swelling, abdominal distention, worsening orthopnea, PND, or early satiety.   Labs independently reviewed: 09/2020 - potassium 3.0, BUN 9, serum creatinine 0.91, BNP 244, Hgb 13.5, PLT 372, TSH normal  Past Medical History:  Diagnosis Date  . Chronic combined systolic (congestive) and diastolic (congestive) heart failure (HCC)   . Frequent PVCs   . Hyperlipemia   . Hypertension     Past Surgical History:  Procedure Laterality Date  . ABDOMINAL HYSTERECTOMY  10/13/2011   Procedure: HYSTERECTOMY ABDOMINAL;  Surgeon: Tilda Burrow, MD;  Location: [facility];  Service: Gynecology;  Laterality: N/A;    Current Medications: Current Meds  Medication Sig  . albuterol (VENTOLIN HFA) 108 (90 Base) MCG/ACT inhaler   . buPROPion (WELLBUTRIN XL) 300 MG 24 hr tablet Take 300 mg by mouth daily.  Marland Kitchen escitalopram (LEXAPRO) 20 MG tablet Take 20 mg by mouth daily.  . famotidine (PEPCID) 20 MG tablet One after supper  .  furosemide (LASIX) 20 MG tablet Take 1 tablet (20 mg total) by mouth daily.  Marland Kitchen ipratropium-albuterol (DUONEB) 0.5-2.5 (3) MG/3ML SOLN SMARTSIG:3 Milliliter(s) Via Nebulizer Every 6 Hours  . levocetirizine (XYZAL) 5 MG tablet Take 5 mg by mouth every evening.  Marland Kitchen losartan (COZAAR) 25 MG tablet Take 1 tablet (25 mg total) by mouth daily.  . metoprolol succinate (TOPROL-XL) 25 MG 24 hr tablet Take 1 tablet (25 mg total) by mouth in the morning and at bedtime.  . mometasone-formoterol (DULERA) 100-5 MCG/ACT AERO Take 2 puffs first thing in am and then another 2 puffs about 12 hours later.  . pantoprazole (PROTONIX) 40 MG tablet Take 1 tablet (40 mg total) by mouth daily. Take 30-60 min before first meal of the day  . [DISCONTINUED] potassium chloride SA (KLOR-CON M20) 20 MEQ tablet 2 daily x 3 days, then 1 daily    Allergies:   Penicillins   Social History   Socioeconomic History  . Marital status: Married    Spouse name: Not on file  . Number of children: Not on file  . Years of education: Not on file  . Highest education level: Not on file  Occupational History  . Not on file  Tobacco Use  . Smoking status: Current Every Day Smoker    Packs/day: 1.00    Years: 30.00    Pack years: 30.00    Types: Cigarettes  . Smokeless tobacco: Never Used  . Tobacco comment: smokes 1/2 of a pack per day 09/06/2020  Substance and Sexual Activity  . Alcohol use: No  . Drug use: Yes    Frequency: 7.0 times per week    Types: Marijuana  . Sexual activity: Yes    Birth control/protection: None, Surgical  Other Topics Concern  . Not on file  Social History Narrative  . Not on file   Social Determinants of Health   Financial Resource Strain: Not on file  Food Insecurity: Not on file  Transportation Needs: Not on file  Physical Activity: Not on file  Stress: Not on file  Social Connections: Not on file     Family History:  The patient's family history includes Alzheimer's disease in her  mother; Cancer in her brother, maternal aunt, and mother; Diabetes in her father and sister; Gout in her brother and father; Heart disease in her sister; Hypertension in her brother, father, and mother; Kidney disease in her father and maternal grandmother; Obesity in her sister; Stroke in her maternal grandmother and paternal grandmother. There is no history of Anesthesia problems.  ROS:   Review of Systems  Constitutional: Negative for chills, diaphoresis, fever, malaise/fatigue and weight loss.  HENT: Negative for congestion.   Eyes: Negative for discharge and redness.  Respiratory: Negative for cough, sputum production, shortness of breath and wheezing.   Cardiovascular: Negative for chest pain, palpitations, orthopnea, claudication, leg swelling and PND.  Gastrointestinal: Negative for abdominal pain, heartburn, nausea and vomiting.  Musculoskeletal: Negative for falls and myalgias.       Cramping  Skin: Negative for rash.  Neurological: Negative for dizziness, tingling, tremors, sensory  change, speech change, focal weakness, loss of consciousness and weakness.  Endo/Heme/Allergies: Does not bruise/bleed easily.  Psychiatric/Behavioral: Negative for substance abuse. The patient is not nervous/anxious.   All other systems reviewed and are negative.    EKGs/Labs/Other Studies Reviewed:    Studies reviewed were summarized above. The additional studies were reviewed today:  2D echo 10/23/2020: 1. Left ventricular ejection fraction, by estimation, is 35 to 40%. Left  ventricular ejection fraction by 3D volume is 43 %. The left ventricle has  moderately decreased function. The left ventricle demonstrates global  hypokinesis. Left ventricular  diastolic parameters are consistent with Grade II diastolic dysfunction  (pseudonormalization). The average left ventricular global longitudinal  strain is -10.2 %. The global longitudinal strain is abnormal.  2. Right ventricular systolic  function is mildly reduced. The right  ventricular size is mildly enlarged.  3. The mitral valve is normal in structure. Mild to moderate mitral valve  regurgitation.  4. The aortic valve was not well visualized. Aortic valve regurgitation  is mild to moderate. Mild aortic valve sclerosis is present, with no  evidence of aortic valve stenosis.  5. The inferior vena cava is dilated in size with <50% respiratory  variability, suggesting right atrial pressure of 15 mmHg.   EKG:  EKG is ordered today.  The EKG ordered today demonstrates NSR, 72 bpm, possible left atrial enlargement, RBBB, no acute ST-T changes.  When compared to last tracing frequent PVCs in a pattern of ventricular bigeminy are no longer present.  Recent Labs: 09/10/2020: B Natriuretic Peptide 244.0; BUN 9; Creatinine, Ser 0.91; Hemoglobin 13.5; Platelets 372; Potassium 3.0; Sodium 133; TSH 1.726  Recent Lipid Panel No results found for: CHOL, TRIG, HDL, CHOLHDL, VLDL, LDLCALC, LDLDIRECT  PHYSICAL EXAM:    VS:  BP 114/72 (BP Location: Left Arm, Patient Position: Sitting, Cuff Size: Normal)   Pulse 72   Ht 5\' 10"  (1.778 m)   Wt 246 lb (111.6 kg)   LMP 09/21/2011   SpO2 98%   BMI 35.30 kg/m   BMI: Body mass index is 35.3 kg/m.  Physical Exam Constitutional:      Appearance: She is well-developed and well-nourished.  HENT:     Head: Normocephalic and atraumatic.  Eyes:     General:        Right eye: No discharge.        Left eye: No discharge.  Neck:     Vascular: No JVD.  Cardiovascular:     Rate and Rhythm: Normal rate and regular rhythm.     Pulses: No midsystolic click and no opening snap.          Dorsalis pedis pulses are 2+ on the right side and 2+ on the left side.       Posterior tibial pulses are 2+ on the right side and 2+ on the left side.     Heart sounds: S1 normal and S2 normal. Heart sounds not distant. Murmur heard.  High-pitched blowing holosystolic murmur is present with a grade of 1/6 at  the apex. No friction rub.  Pulmonary:     Effort: Pulmonary effort is normal. No respiratory distress.     Breath sounds: Normal breath sounds. No decreased breath sounds, wheezing or rales.  Chest:     Chest wall: No tenderness.  Abdominal:     General: There is no distension.     Palpations: Abdomen is soft.     Tenderness: There is no abdominal tenderness.  Musculoskeletal:  General: No edema.     Cervical back: Normal range of motion.  Skin:    General: Skin is warm and dry.     Nails: There is no clubbing or cyanosis.  Neurological:     Mental Status: She is alert and oriented to person, place, and time.  Psychiatric:        Mood and Affect: Mood and affect normal.        Speech: Speech normal.        Behavior: Behavior normal.        Thought Content: Thought content normal.        Judgment: Judgment normal.     Wt Readings from Last 3 Encounters:  10/30/20 246 lb (111.6 kg)  10/02/20 250 lb (113.4 kg)  09/06/20 255 lb (115.7 kg)     ASSESSMENT & PLAN:   1. Acute combined systolic and diastolic CHF: She appears euvolemic with NYHA class II symptoms.  Her weight is down 4 pounds when compared to her initial visit earlier this month.  We had a long discussion regarding her heart failure today including potential etiologies, work-up and treatment of this.  We will schedule her for [facility] with Dr. Saunders Revel to begin with.  Continue Toprol XL, losartan, and Lasix.  It is noted that she reports taking an old medication that she had at home, possibly Bumex in addition to furosemide for 2 weeks since she was last seen.  However, over the past 2 weeks she has been taking furosemide exclusively along with losartan and metoprolol.  I have explained to her that some of her lower extremity cramping noted was likely in the setting of her taking 2 separate diuretics.  If renal function and potassium allow on recheck labs today, look to add spironolactone on 12/30 with a follow up BMP 7 days  thereafter to further optimize GDMT.  Check CMP following addition of losartan and Lasix as well as to reevaluate her hypokalemia.  CHF education. Risks and benefits of cardiac catheterization have been discussed with the patient including risks of bleeding, bruising, infection, kidney damage, stroke, heart attack, urgent need for bypass, injury to limb, and death. The patient understands these risks and is willing to proceed with the procedure. All questions have been answered and concerns listened to.   2. Frequent PVCs: Quiescent on twelve-lead EKG today.  Plan for ischemic evaluation as outlined above.  If she is found to have nonischemic cardiomyopathy we will plan to place a Zio patch following her R/LHC to quantify PVC burden and to assist in further recommendations regarding management of her PVCs and cardiomyopathy.  Check magnesium.  Trend hypokalemia with recommendation to replete to goal 4.0 as indicated.  Recent TSH normal.  3. Hypokalemia: She remains on KCl supplementation.  Check CMP as above.  4. HTN: Blood pressure is well controlled today.  Continue current medical therapy as outlined above.  5. HLD: No lipid panel available for review.  Not currently on a statin.  Check lipid panel and direct LDL for further risk stratification.  6. Tobacco use: Complete cessation is recommended.  Disposition: F/u with Dr. Saunders Revel or an APP in 2 weeks.   Medication Adjustments/Labs and Tests Ordered: Current medicines are reviewed at length with the patient today.  Concerns regarding medicines are outlined above. Medication changes, Labs and Tests ordered today are summarized above and listed in the Patient Instructions accessible in Encounters.   Signed, Christell Faith, PA-C 10/30/2020 2:36 PM  [address], [place] [phone]

## 2020-10-30 ENCOUNTER — Ambulatory Visit: Payer: BLUE CROSS/BLUE SHIELD | Admitting: Physician Assistant

## 2020-10-30 ENCOUNTER — Other Ambulatory Visit: Payer: Self-pay

## 2020-10-30 ENCOUNTER — Encounter: Payer: Self-pay | Admitting: Physician Assistant

## 2020-10-30 VITALS — BP 114/72 | HR 72 | Ht 70.0 in | Wt 246.0 lb

## 2020-10-30 DIAGNOSIS — I10 Essential (primary) hypertension: Secondary | ICD-10-CM | POA: Diagnosis not present

## 2020-10-30 DIAGNOSIS — E876 Hypokalemia: Secondary | ICD-10-CM | POA: Diagnosis not present

## 2020-10-30 DIAGNOSIS — I493 Ventricular premature depolarization: Secondary | ICD-10-CM

## 2020-10-30 DIAGNOSIS — Z79899 Other long term (current) drug therapy: Secondary | ICD-10-CM | POA: Diagnosis not present

## 2020-10-30 DIAGNOSIS — I5041 Acute combined systolic (congestive) and diastolic (congestive) heart failure: Secondary | ICD-10-CM | POA: Diagnosis not present

## 2020-10-30 DIAGNOSIS — E785 Hyperlipidemia, unspecified: Secondary | ICD-10-CM

## 2020-10-30 NOTE — Patient Instructions (Signed)
Medication Instructions:   Your physician recommends that you continue on your current medications as directed. Please refer to the Current Medication list given to you today.  We have removed potassium from your medlist as you are no longer taking.   *If you need a refill on your cardiac medications before your next appointment, please call your pharmacy*   Lab Work:  Your physician recommends that you have lab work TODAY: CBC, Cmet, Magnesium, Lipid panel, Direct LDL  If you have labs (blood work) drawn today and your tests are completely normal, you will receive your results only by: Marland Kitchen MyChart Message (if you have MyChart) OR . A paper copy in the mail If you have any lab test that is abnormal or we need to change your treatment, we will call you to review the results.   Testing/Procedures: [facility] Cardiac Cath Instructions   You are scheduled for a Cardiac Cath on: Tuesday 11/05/20 with Dr. Saunders Revel  Please arrive at the medical mall at 7:30 am on the day of your procedure.  Please expect a call from our [place] to pre-register you  Do not eat/drink anything after midnight  Someone will need to drive you home  It is recommended someone be with you for the first 24 hours after your procedure  Wear clothes that are easy to get on/off and wear slip on shoes if possible   Medications bring a current list of all medications with you  _X__ Do not take these medications before your procedure: Furosemide - hold morning of procedure  You may take all of your remaining medications the morning of your procedure with enough water to swallow safely  Day of your procedure: Arrive at the [facility] entrance.  Free valet service is available.  After entering the [facility] please check-in at the registration desk (1st desk on your right) to receive your armband. After receiving your armband someone will escort you to the cardiac cath/special procedures waiting  area.  The usual length of stay after your procedure is about 2 to 3 hours.  This can vary.  If you have any questions, please call our office at [phone], or you may call the cardiac cath lab at [facility] directly at [place]- TEST: You will need a COVID TEST prior to the procedure:  LOCATION: [place] [facility]-Op [address] [address]-Thru Testing site.  DATE/TIME:  Friday 11/01/20 (anytime between 8 am and 12 pm)    Follow-Up: At [facility], you and your health needs are our priority.  As part of our continuing mission to provide you with exceptional heart care, we have created designated Provider Care Teams.  These Care Teams include your primary Cardiologist (physician) and Advanced Practice Providers (APPs -  Physician Assistants and Nurse Practitioners) who all work together to provide you with the care you need, when you need it.  We recommend signing up for the patient portal called "MyChart".  Sign up information is provided on this After Visit Summary.  MyChart is used to connect with patients for Virtual Visits (Telemedicine).  Patients are able to view lab/test results, encounter notes, upcoming appointments, etc.  Non-urgent messages can be sent to your provider as well.   To learn more about what you can do with MyChart, go to [email or web address].    Your next appointment:   2 week(s)  The format for your next appointment:   In Person  Provider:   You may see Nelva Bush,  MD or one of the following Advanced Practice Providers on your designated Care Team:    Nicolasa Ducking, NP  Eula Listen, PA-C  Marisue Ivan, PA-C  Cadence [place], [place]  Gillian Shields, NP

## 2020-10-31 ENCOUNTER — Encounter: Payer: Self-pay | Admitting: *Deleted

## 2020-10-31 ENCOUNTER — Telehealth: Payer: Self-pay | Admitting: *Deleted

## 2020-10-31 DIAGNOSIS — I5021 Acute systolic (congestive) heart failure: Secondary | ICD-10-CM

## 2020-10-31 LAB — CBC
Hematocrit: 44.4 % (ref 34.0–46.6)
Hemoglobin: 14.4 g/dL (ref 11.1–15.9)
MCH: 26.8 pg (ref 26.6–33.0)
MCHC: 32.4 g/dL (ref 31.5–35.7)
MCV: 83 fL (ref 79–97)
Platelets: 253 10*3/uL (ref 150–450)
RBC: 5.37 x10E6/uL — ABNORMAL HIGH (ref 3.77–5.28)
RDW: 13.4 % (ref 11.7–15.4)
WBC: 5.5 10*3/uL (ref 3.4–10.8)

## 2020-10-31 LAB — LIPID PANEL
Chol/HDL Ratio: 8 ratio — ABNORMAL HIGH (ref 0.0–4.4)
Cholesterol, Total: 231 mg/dL — ABNORMAL HIGH (ref 100–199)
HDL: 29 mg/dL — ABNORMAL LOW (ref >39)
LDL Chol Calc (NIH): 153 mg/dL — ABNORMAL HIGH (ref 0–99)
Triglycerides: 262 mg/dL — ABNORMAL HIGH (ref 0–149)
VLDL Cholesterol Cal: 49 mg/dL — ABNORMAL HIGH (ref 5–40)

## 2020-10-31 LAB — COMPREHENSIVE METABOLIC PANEL
ALT: 18 IU/L (ref 0–32)
AST: 25 IU/L (ref 0–40)
Albumin/Globulin Ratio: 1.6 (ref 1.2–2.2)
Albumin: 4 g/dL (ref 3.8–4.9)
Alkaline Phosphatase: 90 IU/L (ref 44–121)
BUN/Creatinine Ratio: 11 (ref 9–23)
BUN: 10 mg/dL (ref 6–24)
Bilirubin Total: <0.2 mg/dL (ref 0.0–1.2)
CO2: 22 mmol/L (ref 20–29)
Calcium: 9.4 mg/dL (ref 8.7–10.2)
Chloride: 100 mmol/L (ref 96–106)
Creatinine, Ser: 0.95 mg/dL (ref 0.57–1.00)
GFR calc Af Amer: 78 mL/min/{1.73_m2} (ref >59)
GFR calc non Af Amer: 68 mL/min/{1.73_m2} (ref >59)
Globulin, Total: 2.5 g/dL (ref 1.5–4.5)
Glucose: 89 mg/dL (ref 65–99)
Potassium: 3.6 mmol/L (ref 3.5–5.2)
Sodium: 138 mmol/L (ref 134–144)
Total Protein: 6.5 g/dL (ref 6.0–8.5)

## 2020-10-31 LAB — MAGNESIUM: Magnesium: 1.6 mg/dL (ref 1.6–2.3)

## 2020-10-31 LAB — LDL CHOLESTEROL, DIRECT: LDL Direct: 164 mg/dL — ABNORMAL HIGH (ref 0–99)

## 2020-10-31 MED ORDER — SPIRONOLACTONE 25 MG PO TABS: 12.5000 mg | ORAL_TABLET | Freq: Every day | ORAL | 3 refills | Status: DC

## 2020-10-31 MED ORDER — MAGNESIUM OXIDE 400 MG PO CAPS: 400.0000 mg | ORAL_CAPSULE | Freq: Every day | ORAL | 3 refills | Status: DC

## 2020-10-31 NOTE — Telephone Encounter (Signed)
-----   Message from Sondra Barges, PA-C sent at 10/31/2020  7:20 AM EST ----- Renal and liver function are normal.  Potassium is improved and almost at goal.  Random glucose is normal.  Blood count is normal.  Magnesium is normal, though slightly below goal.  Cholesterol is elevated with an LDL of 164.   Recommendations: -Start spironolactone 12.5 mg daily -Start magnesium oxide 400 mg daily -Continue current cardiac medications including Lasix 20 mg daily, losartan 25 mg daily, and Toprol XL 25 mg bid -Recheck BMET 1 week after starting spironolactone  -Work on heart healthy diet, based on cath results, we may need to add a cholesterol medication down the road

## 2020-10-31 NOTE — Telephone Encounter (Signed)
Spoke with patient and reviewed results and recommendations by provider. She verbalized understanding of all instructions and requested that I please mail her these instructions as well. Order entered for repeat labs to be done at the [facility] entrance one week after starting new medication spironolactone. She verbalized understanding of all instructions with no further questions at this time.

## 2020-10-31 NOTE — Telephone Encounter (Signed)
No answer/No voicemail box is set up 

## 2020-11-01 ENCOUNTER — Other Ambulatory Visit: Payer: Self-pay

## 2020-11-01 ENCOUNTER — Other Ambulatory Visit
Admission: RE | Admit: 2020-11-01 | Discharge: 2020-11-01 | Disposition: A | Discharge status: Home / Self Care | Payer: BLUE CROSS/BLUE SHIELD | Source: Ambulatory Visit | Attending: Internal Medicine | Admitting: Internal Medicine

## 2020-11-01 DIAGNOSIS — U071 COVID-19: Secondary | ICD-10-CM | POA: Diagnosis not present

## 2020-11-01 DIAGNOSIS — Z01812 Encounter for preprocedural laboratory examination: Secondary | ICD-10-CM | POA: Insufficient documentation

## 2020-11-02 LAB — SARS CORONAVIRUS 2 (TAT 6-24 HRS): SARS Coronavirus 2: POSITIVE — AB

## 2020-11-03 ENCOUNTER — Telehealth: Payer: Self-pay | Admitting: Physician Assistant

## 2020-11-03 NOTE — Telephone Encounter (Signed)
Called to Discuss with patient about Covid symptoms and the use of the monoclonal antibody infusion for those with mild to moderate Covid symptoms and at a high risk of hospitalization.     Pt appears to qualify for this infusion due to co-morbid conditions and/or a member of an at-risk group in accordance with the FDA Emergency Use Authorization.    Unable to reach pt. LVMTCB.   

## 2020-11-04 ENCOUNTER — Telehealth: Payer: Self-pay | Admitting: *Deleted

## 2020-11-04 NOTE — Telephone Encounter (Signed)
-----   Message from Gibson Ramp, RN sent at 11/04/2020  8:34 AM EST -----  ----- Message ----- From: Yvonne Kendall, MD Sent: 11/04/2020   7:19 AM EST To: Sherlynn Stalls Zabrina Brotherton, RN, [facility] Triage  COVID-19 positive.  Catheterization tomorrow will need to be postponed at least 12 weeks.  It appears covering APP attempted to reach the patient this weekend to discuss results and potential for monoclonal antibody therapy.

## 2020-11-04 NOTE — Telephone Encounter (Signed)
Results called to pt. Pt verbalized understanding. Patient is aware that provider had left her a message about the monoclonal antibodies infusion. I told her I would call her later this week to plan to reschedule the cath.  Patient agreeable and was surprised she was positive for COVID.

## 2020-11-05 ENCOUNTER — Ambulatory Visit
Admission: RE | Admit: 2020-11-05 | Payer: BLUE CROSS/BLUE SHIELD | Source: Home / Self Care | Admitting: Internal Medicine

## 2020-11-05 ENCOUNTER — Encounter: Admission: RE | Payer: Self-pay | Source: Home / Self Care

## 2020-11-05 SURGERY — RIGHT/LEFT HEART CATH AND CORONARY ANGIOGRAPHY
Anesthesia: Moderate Sedation | Laterality: Bilateral

## 2020-11-06 ENCOUNTER — Ambulatory Visit: Payer: BLUE CROSS/BLUE SHIELD | Admitting: Internal Medicine

## 2020-11-06 NOTE — Telephone Encounter (Signed)
Clarified with Dr End: patient can have cath rescheduled in at least 2 weeks.

## 2020-11-11 NOTE — Telephone Encounter (Signed)
Attempted to reach patient to reschedule cath. No answer and no voicemail set up.  Redialed number and patient answered. Rescheduled cath to be on 11/19/20 at 10 am, arrival time of 9 am. Patient aware she does not need COVID test or lab work this time. Patient's follow up also rescheduled. Patient verbalized understanding of date and time of arrival. She still has the pre-procedural instructions with her and has reviewed them.

## 2020-11-15 ENCOUNTER — Other Ambulatory Visit: Admission: RE | Admit: 2020-11-15 | Payer: BC Managed Care – PPO | Source: Ambulatory Visit

## 2020-11-19 ENCOUNTER — Encounter
Admission: RE | Disposition: A | Discharge status: Home / Self Care | Payer: Self-pay | Source: Home / Self Care | Attending: Internal Medicine

## 2020-11-19 ENCOUNTER — Other Ambulatory Visit: Payer: Self-pay

## 2020-11-19 ENCOUNTER — Inpatient Hospital Stay
Admission: RE | Admit: 2020-11-19 | Discharge: 2020-11-21 | DRG: 286 | Disposition: A | Discharge status: Home / Self Care | Payer: BC Managed Care – PPO | Attending: Internal Medicine | Admitting: Internal Medicine

## 2020-11-19 ENCOUNTER — Encounter: Payer: Self-pay | Admitting: Internal Medicine

## 2020-11-19 DIAGNOSIS — I080 Rheumatic disorders of both mitral and aortic valves: Secondary | ICD-10-CM | POA: Diagnosis present

## 2020-11-19 DIAGNOSIS — Z8616 Personal history of COVID-19: Secondary | ICD-10-CM | POA: Diagnosis not present

## 2020-11-19 DIAGNOSIS — D649 Anemia, unspecified: Secondary | ICD-10-CM | POA: Diagnosis present

## 2020-11-19 DIAGNOSIS — I4510 Unspecified right bundle-branch block: Secondary | ICD-10-CM | POA: Diagnosis present

## 2020-11-19 DIAGNOSIS — Z809 Family history of malignant neoplasm, unspecified: Secondary | ICD-10-CM | POA: Diagnosis not present

## 2020-11-19 DIAGNOSIS — Z880 Allergy status to penicillin: Secondary | ICD-10-CM | POA: Diagnosis not present

## 2020-11-19 DIAGNOSIS — Z833 Family history of diabetes mellitus: Secondary | ICD-10-CM | POA: Diagnosis not present

## 2020-11-19 DIAGNOSIS — Z79899 Other long term (current) drug therapy: Secondary | ICD-10-CM | POA: Diagnosis not present

## 2020-11-19 DIAGNOSIS — I5022 Chronic systolic (congestive) heart failure: Secondary | ICD-10-CM | POA: Diagnosis not present

## 2020-11-19 DIAGNOSIS — D509 Iron deficiency anemia, unspecified: Secondary | ICD-10-CM | POA: Diagnosis present

## 2020-11-19 DIAGNOSIS — I10 Essential (primary) hypertension: Secondary | ICD-10-CM | POA: Diagnosis not present

## 2020-11-19 DIAGNOSIS — Z841 Family history of disorders of kidney and ureter: Secondary | ICD-10-CM | POA: Diagnosis not present

## 2020-11-19 DIAGNOSIS — E782 Mixed hyperlipidemia: Secondary | ICD-10-CM

## 2020-11-19 DIAGNOSIS — Z820 Family history of epilepsy and other diseases of the nervous system: Secondary | ICD-10-CM

## 2020-11-19 DIAGNOSIS — I5041 Acute combined systolic (congestive) and diastolic (congestive) heart failure: Secondary | ICD-10-CM

## 2020-11-19 DIAGNOSIS — I110 Hypertensive heart disease with heart failure: Secondary | ICD-10-CM | POA: Diagnosis not present

## 2020-11-19 DIAGNOSIS — R0602 Shortness of breath: Secondary | ICD-10-CM | POA: Diagnosis present

## 2020-11-19 DIAGNOSIS — E876 Hypokalemia: Secondary | ICD-10-CM | POA: Diagnosis present

## 2020-11-19 DIAGNOSIS — E669 Obesity, unspecified: Secondary | ICD-10-CM | POA: Diagnosis not present

## 2020-11-19 DIAGNOSIS — I428 Other cardiomyopathies: Principal | ICD-10-CM | POA: Diagnosis present

## 2020-11-19 DIAGNOSIS — F17210 Nicotine dependence, cigarettes, uncomplicated: Secondary | ICD-10-CM | POA: Diagnosis present

## 2020-11-19 DIAGNOSIS — Z823 Family history of stroke: Secondary | ICD-10-CM | POA: Diagnosis not present

## 2020-11-19 DIAGNOSIS — Z8249 Family history of ischemic heart disease and other diseases of the circulatory system: Secondary | ICD-10-CM

## 2020-11-19 DIAGNOSIS — I5043 Acute on chronic combined systolic (congestive) and diastolic (congestive) heart failure: Secondary | ICD-10-CM | POA: Diagnosis not present

## 2020-11-19 DIAGNOSIS — I493 Ventricular premature depolarization: Secondary | ICD-10-CM | POA: Diagnosis not present

## 2020-11-19 DIAGNOSIS — I5023 Acute on chronic systolic (congestive) heart failure: Secondary | ICD-10-CM

## 2020-11-19 DIAGNOSIS — Z6829 Body mass index (BMI) 29.0-29.9, adult: Secondary | ICD-10-CM

## 2020-11-19 DIAGNOSIS — Z7951 Long term (current) use of inhaled steroids: Secondary | ICD-10-CM

## 2020-11-19 HISTORY — PX: RIGHT/LEFT HEART CATH AND CORONARY ANGIOGRAPHY: CATH118266

## 2020-11-19 LAB — BASIC METABOLIC PANEL
Anion gap: 12 (ref 5–15)
BUN: 13 mg/dL (ref 6–20)
CO2: 26 mmol/L (ref 22–32)
Calcium: 9.5 mg/dL (ref 8.9–10.3)
Chloride: 102 mmol/L (ref 98–111)
Creatinine, Ser: 0.89 mg/dL (ref 0.44–1.00)
GFR, Estimated: >60 mL/min (ref >60)
Glucose, Bld: 89 mg/dL (ref 70–99)
Potassium: 3.2 mmol/L — ABNORMAL LOW (ref 3.5–5.1)
Sodium: 140 mmol/L (ref 135–145)

## 2020-11-19 LAB — CBC
HCT: 41.2 % (ref 36.0–46.0)
Hemoglobin: 13.5 g/dL (ref 12.0–15.0)
MCH: 27.1 pg (ref 26.0–34.0)
MCHC: 32.8 g/dL (ref 30.0–36.0)
MCV: 82.7 fL (ref 80.0–100.0)
Platelets: 321 10*3/uL (ref 150–400)
RBC: 4.98 MIL/uL (ref 3.87–5.11)
RDW: 14.4 % (ref 11.5–15.5)
WBC: 10.9 10*3/uL — ABNORMAL HIGH (ref 4.0–10.5)
nRBC: 0 % (ref 0.0–0.2)

## 2020-11-19 LAB — FERRITIN: Ferritin: 65 ng/mL (ref 11–307)

## 2020-11-19 LAB — HIV ANTIBODY (ROUTINE TESTING W REFLEX): HIV Screen 4th Generation wRfx: NONREACTIVE

## 2020-11-19 SURGERY — RIGHT/LEFT HEART CATH AND CORONARY ANGIOGRAPHY
Anesthesia: Moderate Sedation

## 2020-11-19 MED ORDER — PANTOPRAZOLE SODIUM 40 MG PO TBEC
40.0000 mg | DELAYED_RELEASE_TABLET | Freq: Every morning | ORAL | Status: DC
  Administered 2020-11-20 – 2020-11-21 (×2): 40 mg via ORAL
  Filled 2020-11-19 (×2): qty 1

## 2020-11-19 MED ORDER — HEPARIN (PORCINE) IN NACL 1000-0.9 UT/500ML-% IV SOLN
INTRAVENOUS | Status: AC
  Filled 2020-11-19: qty 1000

## 2020-11-19 MED ORDER — SODIUM CHLORIDE 0.9% FLUSH
3.0000 mL | INTRAVENOUS | Status: DC | PRN
Start: 2020-11-19 — End: 2020-11-21

## 2020-11-19 MED ORDER — IPRATROPIUM-ALBUTEROL 0.5-2.5 (3) MG/3ML IN SOLN: 3.0000 mL | RESPIRATORY_TRACT | Status: DC | PRN

## 2020-11-19 MED ORDER — HYDRALAZINE HCL 20 MG/ML IJ SOLN
10.0000 mg | INTRAMUSCULAR | Status: AC | PRN
  Administered 2020-11-19: 10 mg via INTRAVENOUS
  Filled 2020-11-19: qty 1

## 2020-11-19 MED ORDER — HYDRALAZINE HCL 20 MG/ML IJ SOLN
INTRAMUSCULAR | Status: AC
  Filled 2020-11-19: qty 1

## 2020-11-19 MED ORDER — SODIUM CHLORIDE 0.9 % IV SOLN: INTRAVENOUS | Status: DC

## 2020-11-19 MED ORDER — IOHEXOL 300 MG/ML  SOLN
INTRAMUSCULAR | Status: DC | PRN
  Administered 2020-11-19: 50 mL

## 2020-11-19 MED ORDER — VERAPAMIL HCL 2.5 MG/ML IV SOLN
INTRAVENOUS | Status: AC
  Filled 2020-11-19: qty 2

## 2020-11-19 MED ORDER — MIDAZOLAM HCL 2 MG/2ML IJ SOLN
INTRAMUSCULAR | Status: DC | PRN
  Administered 2020-11-19: 1 mg via INTRAVENOUS

## 2020-11-19 MED ORDER — ASPIRIN 81 MG PO CHEW
CHEWABLE_TABLET | ORAL | Status: AC
  Filled 2020-11-19: qty 1

## 2020-11-19 MED ORDER — SPIRONOLACTONE 25 MG PO TABS
25.0000 mg | ORAL_TABLET | Freq: Every day | ORAL | Status: DC
  Administered 2020-11-20 – 2020-11-21 (×2): 25 mg via ORAL
  Filled 2020-11-19 (×3): qty 1

## 2020-11-19 MED ORDER — SODIUM CHLORIDE 0.9 % IV SOLN: 250.0000 mL | INTRAVENOUS | Status: DC | PRN

## 2020-11-19 MED ORDER — FUROSEMIDE 10 MG/ML IJ SOLN
INTRAMUSCULAR | Status: DC | PRN
  Administered 2020-11-19: 40 mg via INTRAVENOUS

## 2020-11-19 MED ORDER — SODIUM CHLORIDE 0.9% FLUSH
3.0000 mL | Freq: Two times a day (BID) | INTRAVENOUS | Status: DC
  Administered 2020-11-19 – 2020-11-21 (×4): 3 mL via INTRAVENOUS

## 2020-11-19 MED ORDER — BUPROPION HCL ER (XL) 150 MG PO TB24
300.0000 mg | ORAL_TABLET | Freq: Every day | ORAL | Status: DC
  Administered 2020-11-20 – 2020-11-21 (×2): 300 mg via ORAL
  Filled 2020-11-19 (×2): qty 2
  Filled 2020-11-19: qty 1

## 2020-11-19 MED ORDER — HYDRALAZINE HCL 20 MG/ML IJ SOLN
INTRAMUSCULAR | Status: AC
  Administered 2020-11-19: 10 mg via INTRAVENOUS
  Filled 2020-11-19: qty 1

## 2020-11-19 MED ORDER — HEPARIN (PORCINE) IN NACL 1000-0.9 UT/500ML-% IV SOLN
INTRAVENOUS | Status: DC | PRN
  Administered 2020-11-19: 500 mL

## 2020-11-19 MED ORDER — HEPARIN SODIUM (PORCINE) 1000 UNIT/ML IJ SOLN
INTRAMUSCULAR | Status: AC
  Filled 2020-11-19: qty 1

## 2020-11-19 MED ORDER — HEPARIN SODIUM (PORCINE) 1000 UNIT/ML IJ SOLN
INTRAMUSCULAR | Status: DC | PRN
  Administered 2020-11-19: 5000 [IU] via INTRAVENOUS

## 2020-11-19 MED ORDER — SACUBITRIL-VALSARTAN 24-26 MG PO TABS
1.0000 | ORAL_TABLET | Freq: Two times a day (BID) | ORAL | Status: DC
  Administered 2020-11-19 – 2020-11-20 (×3): 1 via ORAL
  Filled 2020-11-19 (×5): qty 1

## 2020-11-19 MED ORDER — SODIUM CHLORIDE 0.9% FLUSH: 3.0000 mL | Freq: Two times a day (BID) | INTRAVENOUS | Status: DC

## 2020-11-19 MED ORDER — POTASSIUM CHLORIDE CRYS ER 20 MEQ PO TBCR
40.0000 meq | EXTENDED_RELEASE_TABLET | ORAL | Status: AC
  Administered 2020-11-19 – 2020-11-20 (×2): 40 meq via ORAL
  Filled 2020-11-19 (×2): qty 2

## 2020-11-19 MED ORDER — FUROSEMIDE 10 MG/ML IJ SOLN
INTRAMUSCULAR | Status: AC
  Filled 2020-11-19: qty 4

## 2020-11-19 MED ORDER — ESCITALOPRAM OXALATE 10 MG PO TABS
20.0000 mg | ORAL_TABLET | Freq: Every day | ORAL | Status: DC
  Administered 2020-11-20 – 2020-11-21 (×2): 20 mg via ORAL
  Filled 2020-11-19 (×2): qty 2
  Filled 2020-11-19: qty 1

## 2020-11-19 MED ORDER — FENTANYL CITRATE (PF) 100 MCG/2ML IJ SOLN
INTRAMUSCULAR | Status: DC | PRN
  Administered 2020-11-19: 50 ug via INTRAVENOUS

## 2020-11-19 MED ORDER — ENOXAPARIN SODIUM 40 MG/0.4ML ~~LOC~~ SOLN: 40.0000 mg | SUBCUTANEOUS | Status: DC

## 2020-11-19 MED ORDER — FAMOTIDINE 20 MG PO TABS
20.0000 mg | ORAL_TABLET | Freq: Every evening | ORAL | Status: DC
  Administered 2020-11-19 – 2020-11-20 (×2): 20 mg via ORAL
  Filled 2020-11-19 (×2): qty 1

## 2020-11-19 MED ORDER — ALBUTEROL SULFATE HFA 108 (90 BASE) MCG/ACT IN AERS
2.0000 | INHALATION_SPRAY | RESPIRATORY_TRACT | Status: DC | PRN
  Filled 2020-11-19: qty 6.7

## 2020-11-19 MED ORDER — METOPROLOL SUCCINATE ER 25 MG PO TB24
12.5000 mg | ORAL_TABLET | Freq: Every day | ORAL | Status: DC
  Administered 2020-11-20 – 2020-11-21 (×2): 12.5 mg via ORAL
  Filled 2020-11-19 (×3): qty 1

## 2020-11-19 MED ORDER — FENTANYL CITRATE (PF) 100 MCG/2ML IJ SOLN
INTRAMUSCULAR | Status: AC
  Filled 2020-11-19: qty 2

## 2020-11-19 MED ORDER — ASPIRIN 81 MG PO CHEW
81.0000 mg | CHEWABLE_TABLET | ORAL | Status: AC
  Administered 2020-11-19: 81 mg via ORAL

## 2020-11-19 MED ORDER — ACETAMINOPHEN 325 MG PO TABS
650.0000 mg | ORAL_TABLET | ORAL | Status: DC | PRN
  Administered 2020-11-19 – 2020-11-21 (×3): 650 mg via ORAL
  Filled 2020-11-19 (×3): qty 2

## 2020-11-19 MED ORDER — ACETAMINOPHEN 325 MG PO TABS
ORAL_TABLET | ORAL | Status: AC
  Administered 2020-11-19: 650 mg via ORAL
  Filled 2020-11-19: qty 2

## 2020-11-19 MED ORDER — FUROSEMIDE 10 MG/ML IJ SOLN
40.0000 mg | Freq: Two times a day (BID) | INTRAMUSCULAR | Status: DC
  Administered 2020-11-19 – 2020-11-21 (×4): 40 mg via INTRAVENOUS
  Filled 2020-11-19 (×4): qty 4

## 2020-11-19 MED ORDER — HYDRALAZINE HCL 20 MG/ML IJ SOLN
INTRAMUSCULAR | Status: DC | PRN
Start: 2020-11-19 — End: 2020-11-19
  Administered 2020-11-19 (×2): 10 mg via INTRAVENOUS

## 2020-11-19 MED ORDER — MIDAZOLAM HCL 2 MG/2ML IJ SOLN
INTRAMUSCULAR | Status: AC
  Filled 2020-11-19: qty 2

## 2020-11-19 MED ORDER — VERAPAMIL HCL 2.5 MG/ML IV SOLN
INTRAVENOUS | Status: DC | PRN
  Administered 2020-11-19: 2.5 mg via INTRAVENOUS

## 2020-11-19 MED ORDER — ENOXAPARIN SODIUM 60 MG/0.6ML ~~LOC~~ SOLN
0.5000 mg/kg | SUBCUTANEOUS | Status: DC
  Administered 2020-11-20: 55 mg via SUBCUTANEOUS
  Filled 2020-11-19 (×2): qty 0.6

## 2020-11-19 MED ORDER — SODIUM CHLORIDE 0.9% FLUSH: 3.0000 mL | INTRAVENOUS | Status: DC | PRN

## 2020-11-19 MED ORDER — MOMETASONE FURO-FORMOTEROL FUM 100-5 MCG/ACT IN AERO
2.0000 | INHALATION_SPRAY | Freq: Two times a day (BID) | RESPIRATORY_TRACT | Status: DC
  Administered 2020-11-19 – 2020-11-21 (×4): 2 via RESPIRATORY_TRACT
  Filled 2020-11-19: qty 8.8

## 2020-11-19 MED ORDER — ONDANSETRON HCL 4 MG/2ML IJ SOLN: 4.0000 mg | Freq: Four times a day (QID) | INTRAMUSCULAR | Status: DC | PRN

## 2020-11-19 SURGICAL SUPPLY — 11 items
CATH 5F 110X4 TIG (CATHETERS) ×2 IMPLANT
CATH BALLN WEDGE 5F 110CM (CATHETERS) ×2 IMPLANT
CATH INFINITI JR4 5F (CATHETERS) ×2 IMPLANT
DEVICE RAD TR BAND REGULAR (VASCULAR PRODUCTS) ×2 IMPLANT
GLIDESHEATH SLEND SS 6F .021 (SHEATH) ×2 IMPLANT
GUIDEWIRE INQWIRE 1.5J.035X260 (WIRE) ×1 IMPLANT
INQWIRE 1.5J .035X260CM (WIRE) ×2
KIT MANI 3VAL PERCEP (MISCELLANEOUS) ×2 IMPLANT
PACK CARDIAC CATH (CUSTOM PROCEDURE TRAY) ×2 IMPLANT
SHEATH GLIDE SLENDER 4/5FR (SHEATH) ×2 IMPLANT
WIRE HITORQ VERSACORE ST 145CM (WIRE) ×2 IMPLANT

## 2020-11-19 NOTE — Interval H&P Note (Signed)
History and Physical Interval Note:  11/19/2020 10:42 AM  Lauren Banks  has presented today for surgery, with the diagnosis of chronic HFrEF.  The various methods of treatment have been discussed with the patient and family. After consideration of risks, benefits and other options for treatment, the patient has consented to  Procedure(s): RIGHT/LEFT HEART CATH AND CORONARY ANGIOGRAPHY (N/A) as a surgical intervention.  The patient's history has been reviewed, patient examined, no change in status, stable for surgery.  I have reviewed the patient's chart and labs.  Questions were answered to the patient's satisfaction.    Cath Lab Visit (complete for each Cath Lab visit)  Clinical Evaluation Leading to the Procedure:   ACS: No.  Non-ACS:    Anginal/Heart Failure Classification: NYHA class II  Anti-ischemic medical therapy: Minimal Therapy (1 class of medications)  Non-Invasive Test Results: No non-invasive testing performed (LVEF 35-40% by echo)  Prior CABG: No previous CABG  Lauren Banks

## 2020-11-19 NOTE — OR Nursing (Signed)
TR band removed and replaced with sterile 2x2 and tegaderm.  Mild bruising , area is soft. Marked perimeter. PT education provided.

## 2020-11-19 NOTE — H&P (Signed)
Cardiology Admission History and Physical:   Patient ID: Lauren Banks MRN: [id number]; DOB: [birth year]   Admission date: 11/19/2020  Primary Care Provider: Renee Rival, NP [facility] Cardiologist: Nelva Bush, MD  [facility] Electrophysiologist:  None   Chief Complaint: Shortness of breath  Patient Profile:   Lauren Banks is a 55 y.o. female with history of recently diagnosed HFrEF (LVEF 35-40%), hypertension, hyperlipidemia, and obesity, admitted following diagnostic catheterization revealing nonischemic cardiomyopathy with severely elevated filling pressures and reduced cardiac output.  History of Present Illness:   Lauren Banks was first evaluated in early December in our office for shortness of breath and cough as well as PVCs.  Subsequent echo in late December showed moderately reduced LVEF with evidence of volume overload.  She was started on losartan and furosemide with some improvement in her symptoms.  Today, she continues to have exertional dyspnea with modest activity.  She denies chest pain, palpitations, lightheadedness, and edema.  She wonders if she could switch from furosemide to HCTZ, as she feels like this was working better for her.  She has been compliant with most of her medicines, though she is uncertain if she has been taking spironolactone.  She was initially scheduled for catheterization approximately 3 weeks ago but was incidentally noted to be positive for COVID-19 at the [facility] of December.  She was asymptomatic at the time (other than her chronic cough and exertional dyspnea).   Past Medical History:  Diagnosis Date  . Chronic combined systolic (congestive) and diastolic (congestive) heart failure ([place])   . Frequent PVCs   . Hyperlipemia   . Hypertension     Past Surgical History:  Procedure Laterality Date  . ABDOMINAL HYSTERECTOMY  10/13/2011   Procedure: HYSTERECTOMY ABDOMINAL;  Surgeon: Jonnie Kind, MD;  Location: [facility];  Service:  Gynecology;  Laterality: N/A;     Medications Prior to Admission: Prior to Admission medications   Medication Sig Start Date Sigismund Cross Date Taking? Authorizing Provider  albuterol (VENTOLIN HFA) 108 (90 Base) MCG/ACT inhaler Inhale 2 puffs into the lungs every 4 (four) hours as needed for wheezing or shortness of breath. 07/24/20  Yes [provider]  buPROPion (WELLBUTRIN XL) 300 MG 24 hr tablet Take 300 mg by mouth daily.   Yes [provider]  escitalopram (LEXAPRO) 20 MG tablet Take 20 mg by mouth daily.   Yes [provider]  famotidine (PEPCID) 20 MG tablet One after supper Patient taking differently: Take 20 mg by mouth daily. One after supper 08/07/20  Yes Tanda Rockers, MD  ipratropium-albuterol (DUONEB) 0.5-2.5 (3) MG/3ML SOLN Inhale 3 mLs into the lungs every 4 (four) hours as needed (Asthma). 03/05/20  Yes [provider]  losartan (COZAAR) 25 MG tablet Take 1 tablet (25 mg total) by mouth daily. 10/24/20 01/22/21 Yes Ajanay Farve, Harrell Gave, MD  metoprolol succinate (TOPROL-XL) 25 MG 24 hr tablet Take 1 tablet (25 mg total) by mouth in the morning and at bedtime. 10/02/20  Yes Johnmatthew Solorio, Harrell Gave, MD  mometasone-formoterol [facility]) 100-5 MCG/ACT AERO Take 2 puffs first thing in am and then another 2 puffs about 12 hours later. Patient taking differently: Inhale 2 puffs into the lungs in the morning and at bedtime. Take 2 puffs first thing in am and then another 2 puffs about 12 hours later. 09/06/20  Yes Tanda Rockers, MD  pantoprazole (PROTONIX) 40 MG tablet Take 1 tablet (40 mg total) by mouth daily. Take 30-60 min before first meal of the day  10/09/20  Yes Tanda Rockers, MD  fluticasone (FLONASE) 50 MCG/ACT nasal spray Place 1 spray into both nostrils daily as needed for allergies or rhinitis. Patient not taking: Reported on 11/19/2020    [provider]  furosemide (LASIX) 20 MG tablet Take 1 tablet (20 mg total) by mouth daily. 10/24/20 01/22/21  Renella Steig,  Harrell Gave, MD  levocetirizine (XYZAL) 5 MG tablet Take 5 mg by mouth every evening. Patient not taking: Reported on 11/19/2020    [provider]  Magnesium Oxide 400 MG CAPS Take 1 capsule (400 mg total) by mouth daily. 10/31/20   Rise Mu, PA-C  spironolactone (ALDACTONE) 25 MG tablet Take 0.5 tablets (12.5 mg total) by mouth daily. 10/31/20 01/29/21  Rise Mu, PA-C     Allergies:    Allergies  Allergen Reactions  . Penicillins Hives    DID THE REACTION INVOLVE: Swelling of the face/tongue/throat, SOB, or low BP? Unknown Sudden or severe rash/hives, skin peeling, or the inside of the mouth or nose? Unknown Did it require medical treatment? Unknown  When did it last happen? If all above answers are "NO", may proceed with cephalosporin use.     Social History:   Social History   Tobacco Use  . Smoking status: Current Every Day Smoker    Packs/day: 0.25    Years: 30.00    Pack years: 7.50    Types: Cigarettes  . Smokeless tobacco: Never Used  Substance Use Topics  . Alcohol use: No  . Drug use: Yes    Frequency: 7.0 times per week    Types: Marijuana    Family History:  The patient's family history includes Alzheimer's disease in her mother; Cancer in her brother, maternal aunt, and mother; Diabetes in her father and sister; Gout in her brother and father; Heart disease in her sister; Hypertension in her brother, father, and mother; Kidney disease in her father and maternal grandmother; Obesity in her sister; Stroke in her maternal grandmother and paternal grandmother. There is no history of Anesthesia problems.    ROS:  Please see the history of present illness. All other ROS reviewed and negative.     Physical Exam/Data:   Vitals:   11/19/20 0953 11/19/20 1037  BP: (!) 175/104   Pulse: 72   Resp: 20   Temp: 98.5 F (36.9 C)   TempSrc: Oral   SpO2: 95% 95%  Weight: 110.2 kg   Height: 5\' 10"  (1.778 m)    No intake or output data in the 24  hours ending 11/19/20 1157 Last 3 Weights 11/19/2020 10/30/2020 10/02/2020  Weight (lbs) 243 lb 246 lb 250 lb  Weight (kg) 110.224 kg 111.585 kg 113.399 kg     Body mass index is 34.87 kg/m.  General:  Well nourished, well developed, in no acute distress HEENT: normal Lymph: no adenopathy Neck: Difficult assessing JVP due to body habitus. Endocrine:  No thryomegaly Vascular: No carotid bruits; FA pulses 2+ bilaterally without bruits  Cardiac: Regular rate and rhythm without murmurs, rubs, or gallops Lungs: Mildly diminished breath sounds throughout without wheezes or crackles Abd: soft, nontender, no hepatomegaly  Ext: Trace ankle edema Musculoskeletal:  No deformities, BUE and BLE strength normal and equal Skin: warm and dry  Neuro:  CNs 2-12 intact, no focal abnormalities noted Psych:  Normal affect   EKG:  The ECG that was done 10/30/2020 was personally reviewed and demonstrates normal sinus rhythm with left atrial enlargement and right bundle branch block.  Relevant  CV Studies: TTE (10/23/2020): 1. Left ventricular ejection fraction, by estimation, is 35 to 40%. Left  ventricular ejection fraction by 3D volume is 43 %. The left ventricle has  moderately decreased function. The left ventricle demonstrates global  hypokinesis. Left ventricular  diastolic parameters are consistent with Grade II diastolic dysfunction  (pseudonormalization). The average left ventricular global longitudinal  strain is -10.2 %. The global longitudinal strain is abnormal.  2. Right ventricular systolic function is mildly reduced. The right  ventricular size is mildly enlarged.  3. The mitral valve is normal in structure. Mild to moderate mitral valve  regurgitation.  4. The aortic valve was not well visualized. Aortic valve regurgitation  is mild to moderate. Mild aortic valve sclerosis is present, with no  evidence of aortic valve stenosis.  5. The inferior vena cava is dilated in size with  <50% respiratory  variability, suggesting right atrial pressure of 15 mmHg.  Laboratory Data:  High Sensitivity Troponin:  No results for input(s): TROPONINIHS in the last 720 hours.    ChemistryNo results for input(s): NA, K, CL, CO2, GLUCOSE, BUN, CREATININE, CALCIUM, GFRNONAA, GFRAA, ANIONGAP in the last 168 hours.  No results for input(s): PROT, ALBUMIN, AST, ALT, ALKPHOS, BILITOT in the last 168 hours. HematologyNo results for input(s): WBC, RBC, HGB, HCT, MCV, MCH, MCHC, RDW, PLT in the last 168 hours. BNPNo results for input(s): BNP, PROBNP in the last 168 hours.  DDimer No results for input(s): DDIMER in the last 168 hours.   Radiology/Studies:  CARDIAC CATHETERIZATION  Result Date: 11/19/2020 Conclusions: 1. No angiographically significant coronary artery disease consistent with nonischemic cardiomyopathy. 2. Severely elevated left heart, right heart, and pulmonary artery pressures. 3. Severely reduced Fick cardiac output/index in the setting of markedly elevated systemic blood pressure. Recommendations: 1. Admit for diuresis and optimization of goal-directed medical therapy. 2. Consider advanced heart failure consultation, ideally as an outpatient but potentially as transfer to [facility] if patient fails to respond to inotrope therapy and escalation of evidence-based heart failure agents. Nelva Bush, MD [facility]    Assessment and Plan:   Acute on chronic HFrEF due to nonischemic cardiomyopathy: Though Lauren Banks has relatively stable NYHA class II-III symptoms, her hemodynamics from today's catheterization are quite abnormal with severely elevated right and left heart pressures (RA 25, PW 38, LVEDP 40) as well as markedly reduced Fick cardiac output (CO 2.8, CI 1.3, PA sat 50%).  I worry that she is at high risk for further decompensation and would benefit from inpatient optimization.  Admit to observation.  Start furosemide 40 mg IV twice daily for target net negative  fluid balance of at least 2 to 3 L in 24 hours.  Patient may require several days of aggressive diuresis to achieve a euvolemic status.  Decrease metoprolol succinate to 12.5 mg daily in the setting of significantly reduced cardiac output.  Transition losartan to Entresto 24-26 twice daily.  Increase spironolactone to 25 mg daily.  Consider addition of BiDil, particularly if blood pressure remains severely elevated.  If renal function and potassium allow, consider adding digoxin.  Low threshold for transfer to ICU for inotropic support if patient decompensates or does not respond appropriately to diuresis.  Recent TSH normal.  Check ferritin, HIV, and RPR to exclude potential causes of an ICM.  Anticipate advanced heart failure consultation.  Would favor doing this as an outpatient if she can be optimized during this hospital stay.  However, if she does not respond appropriately, transfer to  [facility] may need to be considered for inpatient heart failure team evaluation/management.  Hypertension: Blood pressure severely elevated on arrival and during catheterization.  It is possible that stress/anxiety may be contributing, as blood pressure was 114/72 at office visit 3 weeks ago.  Decrease metoprolol succinate to 12.5 mg daily.  Transition losartan to Entresto 24-26 twice daily.  Increase spironolactone to 25 mg daily.  Consider addition of BiDil if blood pressure remains significantly elevated.  Hyperlipidemia: No evidence of coronary artery disease, though recent lipid panel was notable for an LDL of 162.  Work on lifestyle modifications.  Consider addition of statin therapy if no improvement.  Check hemoglobin A1c.  PVC's:  Telemetry monitoring.  Maintain potassium and magnesium levels greater than 4.0 and 2.0, respectively.  Treat cardiomyopathy, as outlined above.  Decrease metoprolol, as above, in the setting of low cardiac output.  Consider EP referral if  frequent PVCs persist.   New York Heart Association (NYHA) Functional Class NYHA Class III   Severity of Illness: The appropriate patient status for this patient is OBSERVATION. Observation status is judged to be reasonable and necessary in order to provide the required intensity of service to ensure the patient's safety. The patient's presenting symptoms, physical exam findings, and initial radiographic and laboratory data in the context of their medical condition is felt to place them at decreased risk for further clinical deterioration. Furthermore, it is anticipated that the patient will be medically stable for discharge from the hospital within 2 midnights of admission. The following factors support the patient status of observation.   " The patient's presenting symptoms include dyspnea on exertion and cough. " The initial radiographic and laboratory data are notable for markedly abnormal left and right heart catheterization hemodynamics, as outlined above.    For questions or updates, please contact [place] Please consult [email or web address] for contact info under [facility].    Signed, Nelva Bush, MD  11/19/2020 11:57 AM

## 2020-11-19 NOTE — Progress Notes (Signed)
PHARMACIST - PHYSICIAN COMMUNICATION  CONCERNING:  Enoxaparin (Lovenox) for DVT Prophylaxis    RECOMMENDATION: Patient was prescribed enoxaprin 40mg  q24 hours for VTE prophylaxis.   Filed Weights   11/19/20 0953  Weight: 110.2 kg (243 lb)    Body mass index is 34.87 kg/m.  Estimated Creatinine Clearance: 90 mL/min (by C-G formula based on SCr of 0.95 mg/dL).   Based on [place] patient is candidate for enoxaparin 0.5mg /kg TBW SQ every 24 hours based on BMI being >30.  DESCRIPTION: Pharmacy has adjusted enoxaparin dose per [facility] policy.  Patient is now receiving enoxaparin 55 mg every 24 hours    Berta Minor, PharmD Clinical Pharmacist  11/19/2020 1:56 PM

## 2020-11-20 ENCOUNTER — Encounter: Payer: Self-pay | Admitting: Internal Medicine

## 2020-11-20 DIAGNOSIS — E876 Hypokalemia: Secondary | ICD-10-CM | POA: Diagnosis present

## 2020-11-20 DIAGNOSIS — Z809 Family history of malignant neoplasm, unspecified: Secondary | ICD-10-CM | POA: Diagnosis not present

## 2020-11-20 DIAGNOSIS — E782 Mixed hyperlipidemia: Secondary | ICD-10-CM | POA: Diagnosis present

## 2020-11-20 DIAGNOSIS — I10 Essential (primary) hypertension: Secondary | ICD-10-CM | POA: Diagnosis not present

## 2020-11-20 DIAGNOSIS — I4510 Unspecified right bundle-branch block: Secondary | ICD-10-CM | POA: Diagnosis present

## 2020-11-20 DIAGNOSIS — Z820 Family history of epilepsy and other diseases of the nervous system: Secondary | ICD-10-CM | POA: Diagnosis not present

## 2020-11-20 DIAGNOSIS — E669 Obesity, unspecified: Secondary | ICD-10-CM | POA: Diagnosis present

## 2020-11-20 DIAGNOSIS — F17210 Nicotine dependence, cigarettes, uncomplicated: Secondary | ICD-10-CM | POA: Diagnosis present

## 2020-11-20 DIAGNOSIS — I5023 Acute on chronic systolic (congestive) heart failure: Secondary | ICD-10-CM | POA: Diagnosis not present

## 2020-11-20 DIAGNOSIS — I110 Hypertensive heart disease with heart failure: Secondary | ICD-10-CM | POA: Diagnosis present

## 2020-11-20 DIAGNOSIS — Z833 Family history of diabetes mellitus: Secondary | ICD-10-CM | POA: Diagnosis not present

## 2020-11-20 DIAGNOSIS — Z8616 Personal history of COVID-19: Secondary | ICD-10-CM | POA: Diagnosis not present

## 2020-11-20 DIAGNOSIS — Z79899 Other long term (current) drug therapy: Secondary | ICD-10-CM | POA: Diagnosis not present

## 2020-11-20 DIAGNOSIS — Z823 Family history of stroke: Secondary | ICD-10-CM | POA: Diagnosis not present

## 2020-11-20 DIAGNOSIS — Z8249 Family history of ischemic heart disease and other diseases of the circulatory system: Secondary | ICD-10-CM | POA: Diagnosis not present

## 2020-11-20 DIAGNOSIS — I5022 Chronic systolic (congestive) heart failure: Secondary | ICD-10-CM | POA: Diagnosis not present

## 2020-11-20 DIAGNOSIS — D649 Anemia, unspecified: Secondary | ICD-10-CM | POA: Diagnosis not present

## 2020-11-20 DIAGNOSIS — Z6829 Body mass index (BMI) 29.0-29.9, adult: Secondary | ICD-10-CM | POA: Diagnosis not present

## 2020-11-20 DIAGNOSIS — Z880 Allergy status to penicillin: Secondary | ICD-10-CM | POA: Diagnosis not present

## 2020-11-20 DIAGNOSIS — Z7951 Long term (current) use of inhaled steroids: Secondary | ICD-10-CM | POA: Diagnosis not present

## 2020-11-20 DIAGNOSIS — I080 Rheumatic disorders of both mitral and aortic valves: Secondary | ICD-10-CM | POA: Diagnosis present

## 2020-11-20 DIAGNOSIS — I428 Other cardiomyopathies: Secondary | ICD-10-CM | POA: Diagnosis present

## 2020-11-20 DIAGNOSIS — I5043 Acute on chronic combined systolic (congestive) and diastolic (congestive) heart failure: Secondary | ICD-10-CM | POA: Diagnosis present

## 2020-11-20 DIAGNOSIS — D509 Iron deficiency anemia, unspecified: Secondary | ICD-10-CM | POA: Diagnosis present

## 2020-11-20 DIAGNOSIS — R0602 Shortness of breath: Secondary | ICD-10-CM | POA: Diagnosis present

## 2020-11-20 DIAGNOSIS — Z841 Family history of disorders of kidney and ureter: Secondary | ICD-10-CM | POA: Diagnosis not present

## 2020-11-20 LAB — BASIC METABOLIC PANEL
Anion gap: 11 (ref 5–15)
BUN: 12 mg/dL (ref 6–20)
CO2: 23 mmol/L (ref 22–32)
Calcium: 10 mg/dL (ref 8.9–10.3)
Chloride: 103 mmol/L (ref 98–111)
Creatinine, Ser: 0.93 mg/dL (ref 0.44–1.00)
GFR, Estimated: >60 mL/min (ref >60)
Glucose, Bld: 100 mg/dL — ABNORMAL HIGH (ref 70–99)
Potassium: 4 mmol/L (ref 3.5–5.1)
Sodium: 137 mmol/L (ref 135–145)

## 2020-11-20 LAB — BRAIN NATRIURETIC PEPTIDE: B Natriuretic Peptide: 730.5 pg/mL — ABNORMAL HIGH (ref 0.0–100.0)

## 2020-11-20 LAB — RPR: RPR Ser Ql: NONREACTIVE

## 2020-11-20 MED ORDER — ENOXAPARIN SODIUM 40 MG/0.4ML ~~LOC~~ SOLN
40.0000 mg | SUBCUTANEOUS | Status: DC
  Administered 2020-11-21: 40 mg via SUBCUTANEOUS
  Filled 2020-11-20: qty 0.4

## 2020-11-20 MED ORDER — DIPHENHYDRAMINE HCL 25 MG PO CAPS: 25.0000 mg | ORAL_CAPSULE | Freq: Four times a day (QID) | ORAL | Status: DC | PRN

## 2020-11-20 MED ORDER — ISOSORB DINITRATE-HYDRALAZINE 20-37.5 MG PO TABS
1.0000 | ORAL_TABLET | Freq: Three times a day (TID) | ORAL | Status: DC
  Administered 2020-11-20 – 2020-11-21 (×3): 1 via ORAL
  Filled 2020-11-20 (×5): qty 1

## 2020-11-20 NOTE — Progress Notes (Signed)
BP high in 180's.  Dr. Lora Havens.

## 2020-11-20 NOTE — Progress Notes (Signed)
Pt reports that she took Benadryl from her home medications for "abdominal itching from that glue" Pt was educated to request medications from staff. Pt stated that she understands and that she "won't do it no more" We will request a PRN from MD.

## 2020-11-20 NOTE — Consult Note (Signed)
    Heart Failure Nurse Navigator Note  HFrEF 35-40%, right ventricular systolic function is mildly reduced.  Grade 2 diastolic dysfunction.  Mild to moderate mitral regurgitation.  Mild to moderate aortic insufficiency.  She was admitted for diuresis after cardiac catheterization revealed elevated right heart, left heart pressures and decreased cardiac output.   Comorbidities:  Hypertension Hyperlipidemia Obesity  Medications:  Aspirin 81 mg daily Furosemide 40 mg IV twice a day Metoprolol succinate 12.5 mg daily Sacubitril/valsartan 24/26 mg twice a day Spironolactone 25 mg daily  Labs:  Sodium 140, potassium 3.2, chloride 102, CO2 26, BUN 13 creatinine 0.89 Intake 480 mL Output 4200 mL Blood pressure 159/102 Weight 92.7 kg BMI 29.31  Total cholesterol 231, triglycerides 262, HDL 29, LDL 153.  She states in the past that she has tried 3 different cholesterol-lowering medicines and all met with joint pain and myalgias.  She thought that they were Pravachol and Lipitor and could not recall the third medication,( found Zocor on discharge summary in 2017).   Assessment:  General-she is awake and alert sitting up in the bed in no acute distress.  HEENT-pupils are equal, normocephalic  Cardiac-heart tones are regular rate and rhythm no murmurs rubs or gallops appreciated  Chest- breath sounds are clear to posterior auscultation  GI- soft non tender   Neurologic-speech is clear moves all extremities without difficulty.  Psych-is pleasant and appropriate makes eye contact.     Initial visit with patient.   Discussed  fluid restriction, she states at home that she usually drinks a lot of fluids.  Explained the relationship between taking diuretics and the need for limiting fluid intake daily.  Also discussed importance of daily weight and recording.  Discussed the importance of monitoring her symptoms and reporting abnormality to physician.  Went over 2000 mg  sodium restriction, foods to eat and foods to avoid.    She states that she does not have a scale, TOC made aware.  She was given the living with heart failure booklet along with his zone magnet and information about the outpatient heart failure clinic.   Pricilla Riffle RN [facility]

## 2020-11-20 NOTE — Progress Notes (Signed)
Progress Note  Patient Name: Lauren Banks Date of Encounter: 11/20/2020  [place] [facility] Cardiologist: Nelva Bush, MD   Subjective    UOP good at -3.7L. Patient denies chest pain or sob. She says she is wanting to go home. Creatinine stable. Bps still up.   Inpatient Medications    Scheduled Meds: . buPROPion  300 mg Oral Daily  . [START ON 11/21/2020] enoxaparin (LOVENOX) injection  40 mg Subcutaneous Q24H  . escitalopram  20 mg Oral Daily  . famotidine  20 mg Oral QPM  . furosemide  40 mg Intravenous BID  . metoprolol succinate  12.5 mg Oral Daily  . mometasone-formoterol  2 puff Inhalation BID  . pantoprazole  40 mg Oral q AM  . sacubitril-valsartan  1 tablet Oral BID  . sodium chloride flush  3 mL Intravenous Q12H  . spironolactone  25 mg Oral Daily   Continuous Infusions: . sodium chloride     PRN Meds: sodium chloride, acetaminophen, albuterol, ipratropium-albuterol, ondansetron (ZOFRAN) IV, sodium chloride flush   Vital Signs    Vitals:   11/19/20 2153 11/20/20 0335 11/20/20 0836 11/20/20 1024  BP: (!) 161/95 (!) 186/93 (!) 174/97 (!) 159/102  Pulse: 67 69 72 70  Resp: 19 18    Temp: (!) 97.5 F (36.4 C) 97.7 F (36.5 C) 98.4 F (36.9 C)   TempSrc: Oral Oral Oral   SpO2: 99% 100% 96%   Weight:  92.7 kg    Height:        Intake/Output Summary (Last 24 hours) at 11/20/2020 1050 Last data filed at 11/20/2020 0500 Gross per 24 hour  Intake 480 ml  Output 4200 ml  Net -3720 ml   Last 3 Weights 11/20/2020 11/19/2020 10/30/2020  Weight (lbs) 204 lb 4.8 oz 243 lb 246 lb  Weight (kg) 92.67 kg 110.224 kg 111.585 kg      Telemetry    NSR, HR 70s - Personally Reviewed  ECG    Will order - Personally Reviewed  Physical Exam   GEN: No acute distress.   Neck: difficult to assess JVD Cardiac: RRR, + murmur, rubs, or gallops.  Respiratory: Clear to auscultation bilaterally. GI: Soft, nontender, non-distended  MS: No edema; No deformity. Neuro:   Nonfocal  Psych: Normal affect   Labs    High Sensitivity Troponin:  No results for input(s): TROPONINIHS in the last 720 hours.    Chemistry Recent Labs  Lab 11/19/20 1636 11/20/20 0755  NA 140 137  K 3.2* 4.0  CL 102 103  CO2 26 23  GLUCOSE 89 100*  BUN 13 12  CREATININE 0.89 0.93  CALCIUM 9.5 10.0  GFRNONAA >60 >60  ANIONGAP 12 11     Hematology Recent Labs  Lab 11/19/20 1636  WBC 10.9*  RBC 4.98  HGB 13.5  HCT 41.2  MCV 82.7  MCH 27.1  MCHC 32.8  RDW 14.4  PLT 321    BNPNo results for input(s): BNP, PROBNP in the last 168 hours.   DDimer No results for input(s): DDIMER in the last 168 hours.   Radiology    CARDIAC CATHETERIZATION  Result Date: 11/19/2020 Conclusions: 1. No angiographically significant coronary artery disease consistent with nonischemic cardiomyopathy. 2. Severely elevated left heart, right heart, and pulmonary artery pressures. 3. Severely reduced Fick cardiac output/index in the setting of markedly elevated systemic blood pressure. Recommendations: 1. Admit for diuresis and optimization of goal-directed medical therapy. 2. Consider advanced heart failure consultation, ideally as an outpatient but  potentially as transfer to [facility] if patient fails to respond to inotrope therapy and escalation of evidence-based heart failure agents. Nelva Bush, MD [facility]    Cardiac Studies   R/L Cardiac cath 11/19/20  Conclusions: 1. No angiographically significant coronary artery disease consistent with nonischemic cardiomyopathy. 2. Severely elevated left heart, right heart, and pulmonary artery pressures. 3. Severely reduced Fick cardiac output/index in the setting of markedly elevated systemic blood pressure.  Recommendations: 1. Admit for diuresis and optimization of goal-directed medical therapy. 2. Consider advanced heart failure consultation, ideally as an outpatient but potentially as transfer to [facility] if patient fails to  respond to inotrope therapy and escalation of evidence-based heart failure agents.  Nelva Bush, MD [facility]    Echo 10/23/20 1. Left ventricular ejection fraction, by estimation, is 35 to 40%. Left  ventricular ejection fraction by 3D volume is 43 %. The left ventricle has  moderately decreased function. The left ventricle demonstrates global  hypokinesis. Left ventricular  diastolic parameters are consistent with Grade II diastolic dysfunction  (pseudonormalization). The average left ventricular global longitudinal  strain is -10.2 %. The global longitudinal strain is abnormal.  2. Right ventricular systolic function is mildly reduced. The right  ventricular size is mildly enlarged.  3. The mitral valve is normal in structure. Mild to moderate mitral valve  regurgitation.  4. The aortic valve was not well visualized. Aortic valve regurgitation  is mild to moderate. Mild aortic valve sclerosis is present, with no  evidence of aortic valve stenosis.  5. The inferior vena cava is dilated in size with <50% respiratory  variability, suggesting right atrial pressure of 15 mmHg.   Patient Profile     55 y.o. female with pmh of recently diagnosed HFrEF (LVEF 35-40%), HTN, HLD, and obesity admitted following diagnostic cath revealing nonischemic cardiomyopathy with severely elevated filling pressures and reduced cardiac output.   Assessment & Plan    Acute on chronic HFrEF NICM - LHC showed nonobstructive CAD and RHC showed severely elevated right and left heart pressures and reduced cardiac output so patient was admitted for diuresis - IV lasix 40mg  BID - metoprolol decreased to 12.5mg  dialy - losartan transitioned to entresto 24-26 mg BID - spiro increased to 25mg  daily - can add Bidil considering pressures are up - creatinine stable - UOP -3.7L. weights are innacurate - creatinine stable - unclear reason for CM - cath site with no complications  HTN -  pressures up on arrival - BB decreased as above - Entresto 24-26mg  BID - spiro 25mg  daily - Pressures up, can add Bidil as above  HLD - no CAD - LDL 162 - recommend lifestyle modifications with possible statin  PVCS - pt refusing telemetry this morning however now willing to wear it after conversation - keep Mag>2 and K>4   For questions or updates, please contact [place] [facility] Please consult [email or web address] for contact info under        Signed, Maysun Meditz Ninfa Meeker, PA-C  11/20/2020, 10:50 AM

## 2020-11-21 ENCOUNTER — Ambulatory Visit: Payer: BLUE CROSS/BLUE SHIELD | Admitting: Internal Medicine

## 2020-11-21 DIAGNOSIS — E782 Mixed hyperlipidemia: Secondary | ICD-10-CM

## 2020-11-21 DIAGNOSIS — D649 Anemia, unspecified: Secondary | ICD-10-CM

## 2020-11-21 DIAGNOSIS — I428 Other cardiomyopathies: Secondary | ICD-10-CM

## 2020-11-21 LAB — BASIC METABOLIC PANEL
Anion gap: 14 (ref 5–15)
BUN: 15 mg/dL (ref 6–20)
CO2: 23 mmol/L (ref 22–32)
Calcium: 10 mg/dL (ref 8.9–10.3)
Chloride: 101 mmol/L (ref 98–111)
Creatinine, Ser: 1.08 mg/dL — ABNORMAL HIGH (ref 0.44–1.00)
GFR, Estimated: >60 mL/min (ref >60)
Glucose, Bld: 154 mg/dL — ABNORMAL HIGH (ref 70–99)
Potassium: 3.6 mmol/L (ref 3.5–5.1)
Sodium: 138 mmol/L (ref 135–145)

## 2020-11-21 MED ORDER — FUROSEMIDE 40 MG PO TABS: 40.0000 mg | ORAL_TABLET | Freq: Every day | ORAL | Status: DC

## 2020-11-21 MED ORDER — SACUBITRIL-VALSARTAN 49-51 MG PO TABS
1.0000 | ORAL_TABLET | Freq: Two times a day (BID) | ORAL | Status: DC
  Administered 2020-11-21: 1 via ORAL
  Filled 2020-11-21: qty 1

## 2020-11-21 MED ORDER — FUROSEMIDE 40 MG PO TABS: 40.0000 mg | ORAL_TABLET | Freq: Every day | ORAL | 6 refills | Status: DC

## 2020-11-21 MED ORDER — SPIRONOLACTONE 25 MG PO TABS: 25.0000 mg | ORAL_TABLET | Freq: Every day | ORAL | 3 refills | Status: DC

## 2020-11-21 MED ORDER — ATORVASTATIN CALCIUM 40 MG PO TABS: 40.0000 mg | ORAL_TABLET | Freq: Every day | ORAL | 6 refills | Status: DC

## 2020-11-21 MED ORDER — ISOSORB DINITRATE-HYDRALAZINE 20-37.5 MG PO TABS: 1.0000 | ORAL_TABLET | Freq: Three times a day (TID) | ORAL | 6 refills | Status: DC

## 2020-11-21 MED ORDER — SACUBITRIL-VALSARTAN 49-51 MG PO TABS: 1.0000 | ORAL_TABLET | Freq: Two times a day (BID) | ORAL | 6 refills | Status: DC

## 2020-11-21 NOTE — Discharge Instructions (Signed)

## 2020-11-21 NOTE — Discharge Summary (Addendum)
Discharge Summary    Patient ID: Lauren Banks MRN: [id number]; DOB: 12/20/1965  Admit date: 11/19/2020 Discharge date: 11/21/2020  Primary Care Provider: Renee Rival, NP  Primary Cardiologist: Nelva Bush, MD  Primary Electrophysiologist:  None   Discharge Diagnoses    Principal Problem:   Acute on chronic HFrEF (heart failure with reduced ejection fraction) (HCC)  **Minus 5.2L this admission.  **Discharge weight 108.1 kg.  Active Problems:   NICM (nonischemic cardiomyopathy) (HCC)  **EF 35-40%   Essential hypertension   anemia, iron deficiency   PVC's (premature ventricular contractions)   Mixed hyperlipidemia   Morbid obesity [facility])  Diagnostic Studies/Procedures    Cardiac Catheterization 1.18.2022  Left Main  Vessel is large.  Left Anterior Descending  Vessel is large. Vessel is angiographically normal.  First Diagonal Branch  Vessel is moderate in size.  Second Diagonal Branch  Vessel is small in size.  Third Diagonal Branch  Vessel is small in size.  Left Circumflex  Vessel is large. Vessel is angiographically normal.  First Obtuse Marginal Branch  Vessel is moderate in size.  Second Obtuse Marginal Branch  Vessel is moderate in size.  Third Obtuse Marginal Branch  Vessel is small in size.  Right Coronary Artery  Vessel is large. Vessel is angiographically normal.  Right Posterior Descending Artery  Vessel is large in size.  Right Posterior Atrioventricular Artery  Vessel is small in size.  First Right Posterolateral Branch  Vessel is small in size.   Right Heart Pressures RA (mean): 25 mmHg RV (S/EDP): 83/26 mmHg PA (S/D, mean): 83/40 (54) mmHg PCWP (mean): 38 mmHg  Ao sat: 95% PA sat: 50% Fick CO: 2.8 L/min Fick CI: 1.3 L/min/m^2 PVR: 5.7 Wood units  _____________   History of Present Illness     Lauren Banks is a 55 y.o. female with a history of hypertension, hyperlipidemia, and morbid obesity who was recently evaluated by  cardiology in the outpatient setting secondary to dyspnea, cough, and frequent PVCs.  Subsequent echocardiogram in December 2021 showed LV dysfunction with an EF of 35 to 62%, grade 2 diastolic dysfunction, and mild to moderate mitral regurgitation and aortic insufficiency.  Decision was made to pursue diagnostic cardiac catheterization however, patient subsequently developed COVID-19 infection and catheterization was deferred until she had clinical recovery from [place].  Hospital Course     Consultants: None   Patient presented to the [facility] cardiac catheterization laboratory on January 18 and underwent diagnostic cardiac catheterization revealing normal coronary arteries with elevated right heart pressures including an RA of 25, RV of 83/26, PA of 83/40 (54), and a pulmonary capillary wedge pressure of 38 mmHg.  Cardiac output was 2.8 L/min with an index of 1.3 L/min/m.  In the setting of significant volume overload, patient was admitted post catheterization for aggressive IV diuresis and titration of medications.  She was switched from losartan to [facility] therapy and spironolactone was increased to 25 mg daily.  With medication adjustments and diuresis, she had significant improvement in respiratory and volume status.  She is -5.18 L for admission with a recorded reduction in weight by 2.1 kg.  She is euvolemic on examination this morning and has been ambulating without recurrent symptoms or limitations.  She has been counseled on the importance of medication compliance, sodium restriction, and symptom reporting.  She will be discharged home today in good condition with plans for early follow-up in the office.  Of note, we did add a statin to her current regimen in  the setting of a 10 yr risk cardiovascular risk of 25.5%.  Did the patient have an acute coronary syndrome (MI, NSTEMI, STEMI, etc) this admission?:  No                               Did the patient have a percutaneous coronary  intervention (stent / angioplasty)?:  No.     _____________  Discharge Vitals Blood pressure (!) 137/99, pulse 82, temperature 97.8 F (36.6 C), temperature source Oral, resp. rate 20, height 5\' 10"  (1.778 m), weight 108.1 kg, last menstrual period 09/21/2011, SpO2 98 %.  Filed Weights   11/19/20 0953 11/20/20 0335 11/21/20 0252  Weight: 110.2 kg 92.7 kg 108.1 kg   Physical Exam   GEN: Obese, in no acute distress.  HEENT: Grossly normal.  Neck: Supple, obese, no JVD, carotid bruits, or masses. Cardiac: RRR, no murmurs, rubs, or gallops. No clubbing, cyanosis, edema.  Respiratory:  Respirations regular and unlabored, clear to auscultation bilaterally. GI: Soft, nontender, nondistended, BS + x 4. MS: no deformity or atrophy. Skin: warm and dry, no rash. Neuro:  Strength and sensation are intact. Psych: AAOx3.  Normal affect.  Labs & Radiologic Studies    CBC Recent Labs    11/19/20 1636  WBC 10.9*  HGB 13.5  HCT 41.2  MCV 82.7  PLT 630   Basic Metabolic Panel Recent Labs    11/20/20 0755 11/21/20 0827  NA 137 138  K 4.0 3.6  CL 103 101  CO2 23 23  GLUCOSE 100* 154*  BUN 12 15  CREATININE 0.93 1.08*  CALCIUM 10.0 10.0   ____________   Disposition   Pt is being discharged home today in good condition.  Follow-up Plans & Appointments     Follow-up Information    [facility] Follow up on 12/11/2020.   Specialty: Cardiology Why: at 9:30am. Enter through the [place] entrance Contact information: [place] [phone]       Rise Mu, PA-C Follow up on 12/03/2020.   Specialties: Physician Assistant, Cardiology, Radiology Why: 9:30 AM Contact information: [place] [address] [phone]              Discharge Instructions    (HEART FAILURE PATIENTS) Call MD:  Anytime you have any of the following symptoms: 1) 3  pound weight gain in 24 hours or 5 pounds in 1 week 2) shortness of breath, with or without a dry hacking cough 3) swelling in the hands, feet or stomach 4) if you have to sleep on extra pillows at night in order to breathe.   Complete by: As directed    Call MD for:  difficulty breathing, headache or visual disturbances   Complete by: As directed    Call MD for:  redness, tenderness, or signs of infection (pain, swelling, redness, odor or green/yellow discharge around incision site)   Complete by: As directed    Diet - low sodium heart healthy   Complete by: As directed    Increase activity slowly   Complete by: As directed       Discharge Medications   Allergies as of 11/21/2020      Reactions   Penicillins Hives   DID THE REACTION INVOLVE: Swelling of the face/tongue/throat, SOB, or low BP? Unknown Sudden or severe rash/hives, skin peeling, or the  inside of the mouth or nose? Unknown Did it require medical treatment? Unknown  When did it last happen? If all above answers are "NO", may proceed with cephalosporin use.      Medication List    STOP taking these medications   levocetirizine 5 MG tablet Commonly known as: XYZAL   losartan 25 MG tablet Commonly known as: COZAAR     TAKE these medications   albuterol 108 (90 Base) MCG/ACT inhaler Commonly known as: VENTOLIN HFA Inhale 2 puffs into the lungs every 4 (four) hours as needed for wheezing or shortness of breath.   atorvastatin 40 MG tablet Commonly known as: Lipitor Take 1 tablet (40 mg total) by mouth daily.   buPROPion 300 MG 24 hr tablet Commonly known as: WELLBUTRIN XL Take 300 mg by mouth daily.   escitalopram 20 MG tablet Commonly known as: LEXAPRO Take 20 mg by mouth daily.   famotidine 20 MG tablet Commonly known as: Pepcid One after supper What changed:   how much to take  how to take this  when to take this   fluticasone 50 MCG/ACT nasal spray Commonly known as: FLONASE Place 1  spray into both nostrils daily as needed for allergies or rhinitis.   furosemide 40 MG tablet Commonly known as: LASIX Take 1 tablet (40 mg total) by mouth daily. What changed:   medication strength  how much to take   ipratropium-albuterol 0.5-2.5 (3) MG/3ML Soln Commonly known as: DUONEB Inhale 3 mLs into the lungs every 4 (four) hours as needed (Asthma).   isosorbide-hydrALAZINE 20-37.5 MG tablet Commonly known as: BIDIL Take 1 tablet by mouth 3 (three) times daily.   Magnesium Oxide 400 MG Caps Take 1 capsule (400 mg total) by mouth daily.   metoprolol succinate 25 MG 24 hr tablet Commonly known as: TOPROL-XL Take 1 tablet (25 mg total) by mouth in the morning and at bedtime.   mometasone-formoterol 100-5 MCG/ACT Aero Commonly known as: DULERA Take 2 puffs first thing in am and then another 2 puffs about 12 hours later. What changed:   how much to take  how to take this  when to take this   pantoprazole 40 MG tablet Commonly known as: PROTONIX Take 1 tablet (40 mg total) by mouth daily. Take 30-60 min before first meal of the day   sacubitril-valsartan 49-51 MG Commonly known as: ENTRESTO Take 1 tablet by mouth 2 (two) times daily.   spironolactone 25 MG tablet Commonly known as: ALDACTONE Take 1 tablet (25 mg total) by mouth daily. What changed: how much to take        Outstanding Labs/Studies   F/u bmet at office follow-up 2/1  Duration of Discharge Encounter   Greater than 30 minutes including physician time.  Signed, Murray Hodgkins, NP 11/21/2020, 11:48 AM    Attending Note Patient seen and examined, agree with detailed note above,  Patient presentation and plan discussed on rounds.   EKG lab work, chest x-ray, echocardiogram reviewed independently by myself  Feels well this morning, denies significant shortness of breath or chest tightness No leg swelling, no abdominal distention Weight from this morning pending, scheduled to  received 1 more dose IV Lasix Blood pressure much improved with recent medication changes, Entresto started with titration upwards, on BiDil Minus over 5 L  On examination : alert oriented, no notable JVD , lungs clear to auscultation bilaterally, heart sounds regular normal S1-S2 no murmurs appreciated, abdomen soft nontender no significant lower extremity edema.  Musculoskeletal exam with good range of motion, neurologic exam grossly nonfocal  Lab work reviewed sodium 138 potassium 3.6 Creatinine 1.08 BUN 15 WBC 10.9 hemoglobin 13  A/P: Nonischemic cardiomyopathy, [place] Heart Association class III on arrival Ejection fraction 35 to 40%,  Would continue Entresto 49/51 milligrams p.o. twice daily, Also continue BiDil, Aldactone Lasix changed to 40 daily Close follow-up as outpatient Discussed daily weights, moderating fluid intake   Hypertension Blood pressure improved on current medication as detailed above  Hyperlipidemia No significant coronary disease, LDL 162 Will discuss as outpatient Lifestyle modification recommended  PVCs Continue metoprolol succinate  Long discussion concerning discharge instructions, daily weights, moderating fluid intake, titration of Lasix She is indicated that she lives closer to [place] and would like to follow-up with one of our cardiologist and the [place] office Arrangements will be made Greater than 50% was spent in counseling and coordination of care with patient Total encounter time 35 minutes or more   Signed: Esmond Plants  M.D., Ph.D. [facility]

## 2020-11-21 NOTE — Plan of Care (Signed)

## 2020-11-21 NOTE — TOC Progression Note (Signed)
Transition of Care [facility]) - Progression Note    Patient Details  Name: Lauren Banks MRN: [id number] Date of Birth: 1966-05-10  Transition of Care [facility]) CM/SW [place], RN Phone Number: 11/21/2020, 11:57 AM  Clinical Narrative:  Patient given scales for home use with instructions to weigh same time daily after emptying bladder. Keep log and take it with scheduled PCP visits. Report to PCP if 2lbs or more increase daily or more than 5lbs weekly. Patient voices understanding.          Expected Discharge Plan and Services           Expected Discharge Date: 11/21/20                                     Social Determinants of Health (SDOH) Interventions    Readmission Risk Interventions No flowsheet data found.

## 2020-11-21 NOTE — Progress Notes (Signed)
\    Heart Failure Nurse Navigator Note  HFrEF 35-40%, right ventricular systolic function is mildly reduced.  Grade 2 diastolic dysfunction.  Mild to moderate mitral regurgitation.  Mild to moderate aortic insufficiency.  She was admitted for diuresis after cardiac catheterization which revealed elevated right heart, left heart pressures and decreased cardiac output.   Morbidities:  Hypertension Hyperlipidemia Obesity  Medications:  Aspirin 81 mg daily  Bidil 20/37.5 -3 times a day Metoprolol succinate 12-1/2 mg daily Entresto 49/51 mg 2 times a day Spironolactone 25 mg daily   Labs:  Sodium 138, potassium 3.6, chloride 101, CO2 23, BUN 15, creatinine 1.08 Intake not documented Output 900 mL Weight 108.1 kg, 2 days ago 110.2 kg BMI 34.19 Blood pressure 138/81   Assessment:  General-she is awake and alert, no acute distress.  HEENT- pupils are equal, no JVD.  Cardiac-heart tones of regular rate and rhythm.  Chest-breath sounds are clear to posterior auscultation  Abdomen-soft nontender  Musculoskeletal-there is no lower extremity edema pedal pulses are palpable at 2+  Psych-she is pleasant and appropriate makes good eye contact.  Neurologic- speech is clear moves all extremities without difficulty   Visited with patient today, she states that she ambulated 5 times around the nurses station and did not notice any fatigue or increasing shortness of breath.  States she cannot remember the last time that she was able to do that activity about being very short of breath and fatigued.  Discussed her medications, especially Entresto, she was given information about Entresto along with coupons.  She states that she has been in touch with her pharmacist as she is concerned about taking all these medications and doing correctly that he is going to prepackaged them for her.  Also talked about if she feels that she is having a problem with the medication not to stop that  medication should talk with her doctor first.  Also discussed follow-up in the heart failure clinic and that Ms.Jackelyn Hoehn is a good resource for questions or if she feels that there is problems.  She states that her older sister also has heart failure and together they are going to work on their diet.   Pricilla Riffle RN CHFN

## 2020-11-25 ENCOUNTER — Telehealth: Payer: Self-pay

## 2020-11-25 NOTE — Telephone Encounter (Signed)
   Post hospital discharge phone call to patient.  Spoke with the patient, she was discharged from the hospital 11/21/2020.  She feels she is doing well. No increasing SOB, PND, orthopnea, chest pain, dizziness or light headedness.   Went over her medications--she states that the pharmacy put them in blister packs and taking as prescribed at discharge.  Went over list.  She likes it like that --states it is so much easier, not confusing.  She is weighing herself daily, no weight gain to report.  States that it is steady at 235 lb- may see an ounce or two but that is the only change.    Also discussed her upcoming appointments:  February 1 at 9:30 with Christell Faith at the Cardiology Clinic.  February 9 at 9:30 with Darylene Price in the Heart Failure Outpatient clinic.  Instructed that she would get notification 24 hours before her appointment.  And to bring medications with her.  She had no further questions.  Pricilla Riffle Rn, CHFN

## 2020-11-27 ENCOUNTER — Encounter: Payer: Self-pay | Admitting: Internal Medicine

## 2020-11-27 ENCOUNTER — Ambulatory Visit: Payer: BC Managed Care – PPO | Admitting: Internal Medicine

## 2020-11-27 ENCOUNTER — Other Ambulatory Visit: Payer: Self-pay

## 2020-11-27 VITALS — BP 140/80 | HR 96 | Ht 70.0 in | Wt 246.5 lb

## 2020-11-27 DIAGNOSIS — I493 Ventricular premature depolarization: Secondary | ICD-10-CM

## 2020-11-27 DIAGNOSIS — I5023 Acute on chronic systolic (congestive) heart failure: Secondary | ICD-10-CM | POA: Diagnosis not present

## 2020-11-27 DIAGNOSIS — E782 Mixed hyperlipidemia: Secondary | ICD-10-CM | POA: Diagnosis not present

## 2020-11-27 DIAGNOSIS — I5022 Chronic systolic (congestive) heart failure: Secondary | ICD-10-CM

## 2020-11-27 MED ORDER — FUROSEMIDE 40 MG PO TABS: 40.0000 mg | ORAL_TABLET | Freq: Two times a day (BID) | ORAL | 1 refills | Status: DC

## 2020-11-27 NOTE — Progress Notes (Unsigned)
Follow-up Outpatient Visit Date: 11/27/2020  Primary Care Provider: Renee Rival, NP [place] [address]  Chief Complaint: Follow-up heart failure  HPI:  Lauren Banks is a 55 y.o. female with history of chronic HFrEF, hypertension, hyperlipidemia, frequent PVCs, and tobacco use, who presents for follow-up of HFrEF.  She was last seen in the office in late January for follow-up of frequent PVCs and moderately reduced LVEF by echo.  She was doing well at that time and was referred for right and left heart catheterization for further work-up of her cardiomyopathy.  She subsequently tested positive for COVID-19 as part of preoperative testing though she was asymptomatic.  Catheterization was finally performed and showed no significant CAD but severely elevated filling pressures and reduced cardiac output. Lauren Banks was therefore admitted for diuresis and medical optimization.    No CP or shortness breath.  Minimal edema.  Making good urine.  Up 4 pounds since going home.  Today, Lauren Banks reports that she is feeling significantly better than when she first presented for her catheterization. She still has some exertional dyspnea, though it has improved. She denies chest pain, palpitations, lightheadedness, and edema. She remains compliant with her medications and is tolerating them well.  --------------------------------------------------------------------------------------------------  Past Medical History:  Diagnosis Date  . Chronic combined systolic (congestive) and diastolic (congestive) heart failure ([place])   . Frequent PVCs   . Hyperlipemia   . Hypertension   . Nonischemic cardiomyopathy [facility])    Past Surgical History:  Procedure Laterality Date  . ABDOMINAL HYSTERECTOMY  10/13/2011   Procedure: HYSTERECTOMY ABDOMINAL;  Surgeon: Jonnie Kind, MD;  Location: [facility];  Service: Gynecology;  Laterality: N/A;  . RIGHT/LEFT HEART CATH AND CORONARY ANGIOGRAPHY N/A  11/19/2020   Procedure: RIGHT/LEFT HEART CATH AND CORONARY ANGIOGRAPHY;  Surgeon: Nelva Bush, MD;  Location: [place] [facility];  Service: Cardiovascular;  Laterality: N/A;    Current Meds  Medication Sig  . albuterol (VENTOLIN HFA) 108 (90 Base) MCG/ACT inhaler Inhale 2 puffs into the lungs every 4 (four) hours as needed for wheezing or shortness of breath.  Marland Kitchen atorvastatin (LIPITOR) 40 MG tablet Take 1 tablet (40 mg total) by mouth daily.  Marland Kitchen buPROPion (WELLBUTRIN XL) 300 MG 24 hr tablet Take 300 mg by mouth daily.  Marland Kitchen escitalopram (LEXAPRO) 20 MG tablet Take 20 mg by mouth daily.  . famotidine (PEPCID) 20 MG tablet One after supper (Patient taking differently: Take 20 mg by mouth daily. One after supper)  . fluticasone (FLONASE) 50 MCG/ACT nasal spray Place 1 spray into both nostrils daily as needed for allergies or rhinitis.  Marland Kitchen ipratropium-albuterol (DUONEB) 0.5-2.5 (3) MG/3ML SOLN Inhale 3 mLs into the lungs every 4 (four) hours as needed (Asthma).  . isosorbide-hydrALAZINE (BIDIL) 20-37.5 MG tablet Take 1 tablet by mouth 3 (three) times daily.  . Magnesium Oxide 400 MG CAPS Take 1 capsule (400 mg total) by mouth daily.  . metoprolol succinate (TOPROL-XL) 25 MG 24 hr tablet Take 1 tablet (25 mg total) by mouth in the morning and at bedtime.  . mometasone-formoterol (DULERA) 100-5 MCG/ACT AERO Take 2 puffs first thing in am and then another 2 puffs about 12 hours later. (Patient taking differently: Inhale 2 puffs into the lungs in the morning and at bedtime. Take 2 puffs first thing in am and then another 2 puffs about 12 hours later.)  . pantoprazole (PROTONIX) 40 MG tablet Take 1 tablet (40 mg total) by mouth daily. Take  30-60 min before first meal of the day  . sacubitril-valsartan (ENTRESTO) 49-51 MG Take 1 tablet by mouth 2 (two) times daily.  Marland Kitchen spironolactone (ALDACTONE) 25 MG tablet Take 1 tablet (25 mg total) by mouth daily.  . [DISCONTINUED] furosemide (LASIX) 40 MG tablet Take 1  tablet (40 mg total) by mouth daily.    Allergies: Penicillins  Social History   Tobacco Use  . Smoking status: Current Every Day Smoker    Packs/day: 0.25    Years: 30.00    Pack years: 7.50    Types: Cigarettes  . Smokeless tobacco: Never Used  Substance Use Topics  . Alcohol use: No  . Drug use: Yes    Frequency: 7.0 times per week    Types: Marijuana    Family History  Problem Relation Age of Onset  . Cancer Mother        breast- around age 49, ovarian- around age42  . Hypertension Mother   . Alzheimer's disease Mother   . Hypertension Father   . Diabetes Father   . Kidney disease Father   . Gout Father   . Stroke Maternal Grandmother   . Kidney disease Maternal Grandmother   . Stroke Paternal Grandmother   . Diabetes Sister   . Obesity Sister   . Heart disease Sister   . Hypertension Brother   . Cancer Brother        lymphnode  . Gout Brother   . Cancer Maternal Aunt   . Anesthesia problems Neg Hx     Review of Systems: A 12-system review of systems was performed and was negative except as noted in the HPI.  --------------------------------------------------------------------------------------------------  Physical Exam: BP 140/80 (BP Location: Left Arm, Patient Position: Sitting, Cuff Size: Large)   Pulse 96   Ht 5\' 10"  (1.778 m)   Wt 246 lb 8 oz (111.8 kg)   LMP 09/21/2011 Comment: partial  SpO2 98%   BMI 35.37 kg/m   General:  NAD. Neck: Difficult to assess JVD due to body habitus. Lungs: Clear to auscultation bilaterally without wheezes or crackles. Heart: Regular rate and rhythm without murmurs, rubs, or gallops. Abdomen: Soft, nontender, nondistended. Extremities: No lower extremity edema. Right radial/brachial catheterization sites are healing without hematoma. 2+ right radial pulse.  EKG: Normal sinus rhythm with frequent PVCs (ventricular bigeminy) biatrial enlargement, and right bundle branch block.  Lab Results  Component Value  Date   WBC 10.9 (H) 11/19/2020   HGB 13.5 11/19/2020   HCT 41.2 11/19/2020   MCV 82.7 11/19/2020   PLT 321 11/19/2020    Lab Results  Component Value Date   NA 137 11/27/2020   K 3.8 11/27/2020   CL 100 11/27/2020   CO2 20 11/27/2020   BUN 13 11/27/2020   CREATININE 1.08 (H) 11/27/2020   GLUCOSE 122 (H) 11/27/2020   ALT 18 10/30/2020    Lab Results  Component Value Date   CHOL 231 (H) 10/30/2020   HDL 29 (L) 10/30/2020   LDLCALC 153 (H) 10/30/2020   LDLDIRECT 164 (H) 10/30/2020   TRIG 262 (H) 10/30/2020   CHOLHDL 8.0 (H) 10/30/2020    --------------------------------------------------------------------------------------------------  ASSESSMENT AND PLAN: Acute on chronic HFrEF secondary to nonischemic cardiomyopathy: Lauren Banks feels notably better after her recent catheterization and admission for diuresis with medical optimization. I suspect that she is still retaining fluid based on the results of her catheterization. She has been tracking her weights at home and initially lost about 8 pounds. However, she her  weight has gradually trended up again over the last 3 to 4 days. I have recommended increasing furosemide to 40 mg twice daily. I will check a basic metabolic panel today to ensure stable renal function and electrolytes in the setting of aggressive diuresis during her recent hospitalization and multiple medication changes. We will continue her current doses of metoprolol succinate, Entresto, spironolactone, and BiDil for now, though hopefully these can be escalated at follow-up visit.  Frequent PVCs: EKG today shows ventricular bigeminy, with Lauren Banks having a history of frequent PVCs. It is uncertain if PVCs are a result of her heart failure or if PVCs themselves could be driving her cardiomyopathy. We will continue current dose of metoprolol and check her electrolytes today. As we continue to optimize her goal-directed medical therapy for her heart failure, we will need  to consider placement of an event monitor to assess her PVC burden. If it is quite high, referral to EP for further management will need to be considered.  Mixed hyperlipidemia: Lipid panel last month notable for elevated LDL and triglycerides. In the setting of no ASCVD, I think it would be reasonable to work on lifestyle modifications first.  Follow-up: Return to clinic in 2 weeks.  Nelva Bush, MD 11/28/2020 1:19 PM

## 2020-11-27 NOTE — Patient Instructions (Signed)
Medication Instructions:  Your physician has recommended you make the following change in your medication:   INCREASE Furosemide (Lasix) to 40mg  TWICE daily - A new Rx has been sent to your pharmacy  *If you need a refill on your cardiac medications before your next appointment, please call your pharmacy*   Lab Work: Your physician recommends that you have lab work TODAY: Bmet  If you have labs (blood work) drawn today and your tests are completely normal, you will receive your results only by: Marland Kitchen MyChart Message (if you have MyChart) OR . A paper copy in the mail If you have any lab test that is abnormal or we need to change your treatment, we will call you to review the results.   Testing/Procedures: None ordered   Follow-Up: At [facility], you and your health needs are our priority.  As part of our continuing mission to provide you with exceptional heart care, we have created designated Provider Care Teams.  These Care Teams include your primary Cardiologist (physician) and Advanced Practice Providers (APPs -  Physician Assistants and Nurse Practitioners) who all work together to provide you with the care you need, when you need it.  We recommend signing up for the patient portal called "MyChart".  Sign up information is provided on this After Visit Summary.  MyChart is used to connect with patients for Virtual Visits (Telemedicine).  Patients are able to view lab/test results, encounter notes, upcoming appointments, etc.  Non-urgent messages can be sent to your provider as well.   To learn more about what you can do with MyChart, go to [email or web address].    Your next appointment:   2 week(s)  The format for your next appointment:   In Person  Provider:   You may see Nelva Bush, MD or one of the following Advanced Practice Providers on your designated Care Team:    Murray Hodgkins, NP  Christell Faith, PA-C  Marrianne Mood, PA-C  Cadence [place], [place]  Laurann Montana, NP

## 2020-11-28 ENCOUNTER — Telehealth: Payer: Self-pay | Admitting: *Deleted

## 2020-11-28 ENCOUNTER — Encounter: Payer: Self-pay | Admitting: Internal Medicine

## 2020-11-28 LAB — BASIC METABOLIC PANEL
BUN/Creatinine Ratio: 12 (ref 9–23)
BUN: 13 mg/dL (ref 6–24)
CO2: 20 mmol/L (ref 20–29)
Calcium: 9.8 mg/dL (ref 8.7–10.2)
Chloride: 100 mmol/L (ref 96–106)
Creatinine, Ser: 1.08 mg/dL — ABNORMAL HIGH (ref 0.57–1.00)
GFR calc Af Amer: 67 mL/min/{1.73_m2} (ref >59)
GFR calc non Af Amer: 58 mL/min/{1.73_m2} — ABNORMAL LOW (ref >59)
Glucose: 122 mg/dL — ABNORMAL HIGH (ref 65–99)
Potassium: 3.8 mmol/L (ref 3.5–5.2)
Sodium: 137 mmol/L (ref 134–144)

## 2020-11-28 MED ORDER — POTASSIUM CHLORIDE CRYS ER 20 MEQ PO TBCR: 20.0000 meq | EXTENDED_RELEASE_TABLET | Freq: Every day | ORAL | 5 refills | Status: DC

## 2020-11-28 NOTE — Telephone Encounter (Signed)
The patient has been notified of the result and verbalized understanding.  All questions (if any) were answered.  rx sent to pharmacy.

## 2020-11-28 NOTE — Telephone Encounter (Signed)
-----   Message from Nelva Bush, MD sent at 11/28/2020  9:30 AM EST ----- Please let Ms. Vallie know that her labs are stable.  She should proceed with increased dose of furosemide as we discussed yesterday.  I suggest that we add potassium chloride 20 mEq daily to help prevent hypokalemia.  BMP should be repeated when she is seen for f/u in 2 weeks.

## 2020-12-03 ENCOUNTER — Ambulatory Visit: Payer: BC Managed Care – PPO | Admitting: Physician Assistant

## 2020-12-09 ENCOUNTER — Ambulatory Visit: Payer: BC Managed Care – PPO | Admitting: Physician Assistant

## 2020-12-09 NOTE — Progress Notes (Deleted)
Cardiology Office Note    Date:  12/09/2020   ID:  Lauren Banks, DOB 04/09/1966, MRN [id number]  PCP:  Renee Rival, NP  Cardiologist:  Nelva Bush, MD  Electrophysiologist:  None   Chief Complaint: Follow up  History of Present Illness:   Lauren Banks is a 55 y.o. female with history of normal coronary arteries by cardiac cath in 11/2020, chronic combined systolic and diastolic CHF with an EF of 35 to 40% by echo on 10/23/2020 secondary to NICM, frequent PVCs, Covid infection in 10/2020, HTN, HLD, and tobacco use who presents for follow-up of her cardiomyopathy and PVCs.  She was evaluated by Dr. Saunders Revel as a new patient on 10/02/2020 at the request of her pulmonologist for evaluation of PVCs.  She had been evaluated by pulmonology for cough and shortness of breath that was treated with prednisone with symptomatic improvement.  At her visit with cardiology she reported a prior history of palpitations and previously attributed these to panic attacks.  She also reported intermittent longstanding chest pain.  She reported a prior diagnosis of pleurisy approximately 20 years ago with symptoms of chest discomfort noted to improve with BC powder.  Her pain was described as sharp and left-sided off and underneath the breast.  There were no associated symptoms and her pain was nonexertional.  EKG at her cardiology visit showed normal sinus rhythm with frequent PVCs in a pattern of bigeminy with atrial enlargement and an underlying right bundle branch block.  It was noted that she had normal thyroid function and was on potassium supplementation for previously noted hypokalemia.  She underwent 2D echo on 10/23/2020 which showed an EF of 35 to 40%, global hypokinesis, grade 2 diastolic dysfunction, mildly reduced RV systolic function with a mildly enlarged RV cavity size, mild to moderate mitral regurgitation, mild to moderate aortic valve insufficiency with moderate aortic valve sclerosis with no  evidence of aortic valve stenosis, and a right atrial pressure of 15 mmHg.  Given these findings, she was started on losartan 25 mg daily.  It was noted post echo that her HCTZ had been discontinued by pulmonology and in this setting she noted worsening lower extremity edema.  Given this, she was placed on Lasix 20 mg daily in addition to the above-mentioned losartan.  She was seen in follow up in 10/2020 and reported significant improvement in her symptoms.  She may have been taking 2 different diuretics. She was scheduled for diagnostic R/LHC, though this ended up being delayed secondary her testing positive for Covid during pre-procedure evaluation, though she was asymptomatic and did not require hospitalization. She subsequently underwent R/LHC on 11/19/2020, which showed no significant CAD, but with severely elevated filling pressures and reduced cardiac output.  In this setting, she was admitted and underwent IV diuresis and medication optimization.  She was last seen in the office on 11/27/2020, and was doing well from a cardiac perspective, though was up 4 pounds since her hospital discharge.  She was feeling significantly better than she did when she presented for her cardiac cath.  She did note some improvement in her persistent exertional dyspnea.  Documented weight at that visit was 246 pounds.  Her EKG showed ventricular bigeminy.  Her Lasix was increased to 40 mg bid.   ***   Labs independently reviewed: 11/2020 - BUN 13, SCr 1.08, potassium 3.8, HGB 13.5, PLT 321 10/2020 - direct LDL 164, TC 231, TG 262, HDL 29, magnesium 1.6 09/2020 - TSH normal  Past  Medical History:  Diagnosis Date  . Chronic combined systolic (congestive) and diastolic (congestive) heart failure ([place])   . Frequent PVCs   . Hyperlipemia   . Hypertension   . Nonischemic cardiomyopathy [facility])     Past Surgical History:  Procedure Laterality Date  . ABDOMINAL HYSTERECTOMY  10/13/2011   Procedure: HYSTERECTOMY  ABDOMINAL;  Surgeon: Jonnie Kind, MD;  Location: [facility];  Service: Gynecology;  Laterality: N/A;  . RIGHT/LEFT HEART CATH AND CORONARY ANGIOGRAPHY N/A 11/19/2020   Procedure: RIGHT/LEFT HEART CATH AND CORONARY ANGIOGRAPHY;  Surgeon: Nelva Bush, MD;  Location: [place] [facility];  Service: Cardiovascular;  Laterality: N/A;    Current Medications: No outpatient medications have been marked as taking for the 12/09/20 encounter (Appointment) with Rise Mu, PA-C.    Allergies:   Penicillins   Social History   Socioeconomic History  . Marital status: Married    Spouse name: Not on file  . Number of children: Not on file  . Years of education: Not on file  . Highest education level: Not on file  Occupational History  . Not on file  Tobacco Use  . Smoking status: Current Every Day Smoker    Packs/day: 0.25    Years: 30.00    Pack years: 7.50    Types: Cigarettes  . Smokeless tobacco: Never Used  Substance and Sexual Activity  . Alcohol use: No  . Drug use: Yes    Frequency: 7.0 times per week    Types: Marijuana  . Sexual activity: Yes    Birth control/protection: None, Surgical  Other Topics Concern  . Not on file  Social History Narrative  . Not on file   Social Determinants of Health   Financial Resource Strain: Not on file  Food Insecurity: Not on file  Transportation Needs: Not on file  Physical Activity: Not on file  Stress: Not on file  Social Connections: Not on file     Family History:  The patient's family history includes Alzheimer's disease in her mother; Cancer in her brother, maternal aunt, and mother; Diabetes in her father and sister; Gout in her brother and father; Heart disease in her sister; Hypertension in her brother, father, and mother; Kidney disease in her father and maternal grandmother; Obesity in her sister; Stroke in her maternal grandmother and paternal grandmother. There is no history of Anesthesia problems.  ROS:    ROS   EKGs/Labs/Other Studies Reviewed:    Studies reviewed were summarized above. The additional studies were reviewed today:  2D echo 10/23/2020: 1. Left ventricular ejection fraction, by estimation, is 35 to 40%. Left  ventricular ejection fraction by 3D volume is 43 %. The left ventricle has  moderately decreased function. The left ventricle demonstrates global  hypokinesis. Left ventricular  diastolic parameters are consistent with Grade II diastolic dysfunction  (pseudonormalization). The average left ventricular global longitudinal  strain is -10.2 %. The global longitudinal strain is abnormal.  2. Right ventricular systolic function is mildly reduced. The right  ventricular size is mildly enlarged.  3. The mitral valve is normal in structure. Mild to moderate mitral valve  regurgitation.  4. The aortic valve was not well visualized. Aortic valve regurgitation  is mild to moderate. Mild aortic valve sclerosis is present, with no  evidence of aortic valve stenosis.  5. The inferior vena cava is dilated in size with <50% respiratory  variability, suggesting right atrial pressure of 15 mmHg. __________  [facility] 11/19/2020: Conclusions: 1. No angiographically  significant coronary artery disease consistent with nonischemic cardiomyopathy. 2. Severely elevated left heart, right heart, and pulmonary artery pressures. 3. Severely reduced Fick cardiac output/index in the setting of markedly elevated systemic blood pressure.  Recommendations: 1. Admit for diuresis and optimization of goal-directed medical therapy. 2. Consider advanced heart failure consultation, ideally as an outpatient but potentially as transfer to [facility] if patient fails to respond to inotrope therapy and escalation of evidence-based heart failure agents.    EKG:  EKG is ordered today.  The EKG ordered today demonstrates ***  Recent Labs: 09/10/2020: TSH 1.726 10/30/2020: ALT 18; Magnesium  1.6 11/19/2020: Hemoglobin 13.5; Platelets 321 11/20/2020: B Natriuretic Peptide 730.5 11/27/2020: BUN 13; Creatinine, Ser 1.08; Potassium 3.8; Sodium 137  Recent Lipid Panel    Component Value Date/Time   CHOL 231 (H) 10/30/2020 1439   TRIG 262 (H) 10/30/2020 1439   HDL 29 (L) 10/30/2020 1439   CHOLHDL 8.0 (H) 10/30/2020 1439   LDLCALC 153 (H) 10/30/2020 1439   LDLDIRECT 164 (H) 10/30/2020 1439    PHYSICAL EXAM:    VS:  LMP 09/21/2011 Comment: partial  BMI: There is no height or weight on file to calculate BMI.  Physical Exam  Wt Readings from Last 3 Encounters:  11/27/20 246 lb 8 oz (111.8 kg)  11/21/20 238 lb 4.8 oz (108.1 kg)  10/30/20 246 lb (111.6 kg)     ASSESSMENT & PLAN:   1. Chronic combined systolic and diastolic CHF secondary to NICM: ***  2. Frequent PVCs:  3. HLD: LDL 164 with triglycerides 262 from 10/2020.  ***  Disposition: F/u with Dr. Saunders Revel or an APP in ***.   Medication Adjustments/Labs and Tests Ordered: Current medicines are reviewed at length with the patient today.  Concerns regarding medicines are outlined above. Medication changes, Labs and Tests ordered today are summarized above and listed in the Patient Instructions accessible in Encounters.   Signed, Christell Faith, PA-C 12/09/2020 8:24 AM     [facility] [address] [place] [place], [place] [address] [phone]

## 2020-12-09 NOTE — Progress Notes (Deleted)
   Patient ID: Lauren Banks, female    DOB: 1966-01-05, 55 y.o.   MRN: [id number]  HPI  Lauren Banks is a 55 y/o female with a history of  Echo report from 10/23/20 reviewed and showed an EF of 35-40% along with mild/moderate MR.   RHC/LHC done 11/19/20 showed: 1. No angiographically significant coronary artery disease consistent with nonischemic cardiomyopathy. 2. Severely elevated left heart, right heart, and pulmonary artery pressures. 3. Severely reduced Fick cardiac output/index in the setting of markedly elevated systemic blood pressure.  Admitted 11/19/20 due to cath but then due to fluid overload was admitted. Initially given IV lasix with transition to oral diuretics. Other medication adjustments done with resultant loss of 5.18L. Discharged after 2 days.       She presents today for her initial visit with a chief complaint of       Review of Systems    Physical Exam  Assessment & Plan:  1: Chronic heart failure with reduced ejection fraction- - NYHA class - saw cardiology (End) 11/27/20 - saw pulmonology Lauren Banks) 09/06/20 - BNP 11/20/20 was 730.5  2: HTN- - BP - saw PCP (Lauren Banks)  - BMP 11/27/20 reviewed and showed sodium 137, potassium 3.8, creatinine 1.08 and GFR 67

## 2020-12-10 ENCOUNTER — Encounter: Payer: Self-pay | Admitting: Physician Assistant

## 2020-12-11 ENCOUNTER — Ambulatory Visit: Payer: BC Managed Care – PPO | Admitting: Family

## 2020-12-11 ENCOUNTER — Telehealth: Payer: Self-pay | Admitting: Family

## 2020-12-11 NOTE — Telephone Encounter (Signed)
Patient did not show for her Heart Failure Clinic appointment on 12/11/20. Will attempt to reschedule.

## 2020-12-12 ENCOUNTER — Ambulatory Visit: Payer: BC Managed Care – PPO | Admitting: Internal Medicine

## 2020-12-12 ENCOUNTER — Ambulatory Visit: Payer: BC Managed Care – PPO | Admitting: Family

## 2020-12-12 ENCOUNTER — Other Ambulatory Visit: Payer: Self-pay

## 2020-12-12 ENCOUNTER — Encounter: Payer: Self-pay | Admitting: Internal Medicine

## 2020-12-12 VITALS — BP 118/78 | Ht 70.0 in | Wt 244.0 lb

## 2020-12-12 DIAGNOSIS — I10 Essential (primary) hypertension: Secondary | ICD-10-CM

## 2020-12-12 DIAGNOSIS — E782 Mixed hyperlipidemia: Secondary | ICD-10-CM | POA: Diagnosis not present

## 2020-12-12 DIAGNOSIS — I5022 Chronic systolic (congestive) heart failure: Secondary | ICD-10-CM | POA: Diagnosis not present

## 2020-12-12 DIAGNOSIS — I493 Ventricular premature depolarization: Secondary | ICD-10-CM | POA: Diagnosis not present

## 2020-12-12 DIAGNOSIS — I428 Other cardiomyopathies: Secondary | ICD-10-CM

## 2020-12-12 DIAGNOSIS — I5023 Acute on chronic systolic (congestive) heart failure: Secondary | ICD-10-CM | POA: Diagnosis not present

## 2020-12-12 MED ORDER — HYDRALAZINE HCL 25 MG PO TABS: 37.5000 mg | ORAL_TABLET | Freq: Three times a day (TID) | ORAL | 0 refills | Status: DC

## 2020-12-12 MED ORDER — BISOPROLOL FUMARATE 5 MG PO TABS: 2.5000 mg | ORAL_TABLET | Freq: Every day | ORAL | 1 refills | Status: DC

## 2020-12-12 NOTE — Progress Notes (Signed)
Follow-up Outpatient Visit Date: 12/12/2020  Primary Care Provider: Renee Rival, NP [place] [id number]  Chief Complaint: Follow-up heart failure  HPI:  Lauren Banks is a 55 y.o. female with history of chronic HFrEF due to nonischemic cardiomyopathy, hypertension, hyperlipidemia, frequent PVCs, and tobacco use, who presents for follow-up of heart failure.  I last saw her 2 weeks ago, at which time [name] was feeling much better following her catheterization and subsequent admission for diuresis and optimization of GDMT.  We agreed to increase furosemide to 40 mg twice daily due to gradual increase in her weight following hospitalization in mid January.  Today, [name] reports that she is feeling very well, denying shortness of breath, edema, orthopnea, chest pain, palpitations, and lightheadedness.  She denies medication side effects.  Metoprolol is no longer on her medication list; she believes it was stopped due to myalgias though she also thinks it was being used to treat her cholesterol.  She remains on atorvastatin 40 mg daily without any symptoms at this time.  --------------------------------------------------------------------------------------------------  Past Medical History:  Diagnosis Date  . Chronic combined systolic (congestive) and diastolic (congestive) heart failure ([place])   . Frequent PVCs   . Hyperlipemia   . Hypertension   . Nonischemic cardiomyopathy [facility])    Past Surgical History:  Procedure Laterality Date  . ABDOMINAL HYSTERECTOMY  10/13/2011   Procedure: HYSTERECTOMY ABDOMINAL;  Surgeon: Jonnie Kind, MD;  Location: [facility];  Service: Gynecology;  Laterality: N/A;  . RIGHT/LEFT HEART CATH AND CORONARY ANGIOGRAPHY N/A 11/19/2020   Procedure: RIGHT/LEFT HEART CATH AND CORONARY ANGIOGRAPHY;  Surgeon: Nelva Bush, MD;  Location: [place] [facility];  Service: Cardiovascular;  Laterality: N/A;    Current Meds  Medication Sig  .  albuterol (VENTOLIN HFA) 108 (90 Base) MCG/ACT inhaler Inhale 2 puffs into the lungs every 4 (four) hours as needed for wheezing or shortness of breath.  Marland Kitchen atorvastatin (LIPITOR) 40 MG tablet Take 1 tablet (40 mg total) by mouth daily.  Marland Kitchen buPROPion (WELLBUTRIN XL) 300 MG 24 hr tablet Take 300 mg by mouth daily.  Marland Kitchen escitalopram (LEXAPRO) 20 MG tablet Take 20 mg by mouth daily.  . famotidine (PEPCID) 20 MG tablet One after supper (Patient taking differently: Take 20 mg by mouth daily. One after supper)  . furosemide (LASIX) 40 MG tablet Take 1 tablet (40 mg total) by mouth 2 (two) times daily.  . hydrALAZINE (APRESOLINE) 25 MG tablet Take by mouth.  . isosorbide dinitrate (ISORDIL) 20 MG tablet Take 20 mg by mouth 3 (three) times daily.  . Magnesium Oxide 400 MG CAPS Take 1 capsule (400 mg total) by mouth daily.  . pantoprazole (PROTONIX) 40 MG tablet Take 1 tablet (40 mg total) by mouth daily. Take 30-60 min before first meal of the day  . sacubitril-valsartan (ENTRESTO) 49-51 MG Take 1 tablet by mouth 2 (two) times daily.  Marland Kitchen spironolactone (ALDACTONE) 25 MG tablet Take 1 tablet (25 mg total) by mouth daily.    Allergies: Penicillins  Social History   Tobacco Use  . Smoking status: Current Every Day Smoker    Packs/day: 0.25    Years: 30.00    Pack years: 7.50    Types: Cigarettes  . Smokeless tobacco: Never Used  Substance Use Topics  . Alcohol use: No  . Drug use: Yes    Frequency: 7.0 times per week    Types: Marijuana    Family History  Problem Relation Age  of Onset  . Cancer Mother        breast- around age 74, ovarian- around age44  . Hypertension Mother   . Alzheimer's disease Mother   . Hypertension Father   . Diabetes Father   . Kidney disease Father   . Gout Father   . Stroke Maternal Grandmother   . Kidney disease Maternal Grandmother   . Stroke Paternal Grandmother   . Diabetes Sister   . Obesity Sister   . Heart disease Sister   . Hypertension Brother   .  Cancer Brother        lymphnode  . Gout Brother   . Cancer Maternal Aunt   . Anesthesia problems Neg Hx     Review of Systems: A 12-system review of systems was performed and was negative except as noted in the HPI.  --------------------------------------------------------------------------------------------------  Physical Exam: BP 118/78 (BP Location: Left Arm, Patient Position: Sitting, Cuff Size: Large)   Ht 5\' 10"  (1.778 m)   Wt 244 lb (110.7 kg)   LMP 09/21/2011 Comment: partial  SpO2 98%   BMI 35.01 kg/m   General:  NAD. Neck: No JVD or HJR. Lungs: Clear to auscultation bilaterally without wheezes or crackles. Heart: Regular rate and rhythm without murmurs, rubs, or gallops. Abdomen: Soft, nontender, nondistended. Extremities: No lower extremity edema.  EKG:  NSR with LAE and RBBB.  Compared with prior tracing from 11/27/2020, frequent PVC's are no longer present.  Lab Results  Component Value Date   WBC 10.9 (H) 11/19/2020   HGB 13.5 11/19/2020   HCT 41.2 11/19/2020   MCV 82.7 11/19/2020   PLT 321 11/19/2020    Lab Results  Component Value Date   NA 137 11/27/2020   K 3.8 11/27/2020   CL 100 11/27/2020   CO2 20 11/27/2020   BUN 13 11/27/2020   CREATININE 1.08 (H) 11/27/2020   GLUCOSE 122 (H) 11/27/2020   ALT 18 10/30/2020    Lab Results  Component Value Date   CHOL 231 (H) 10/30/2020   HDL 29 (L) 10/30/2020   LDLCALC 153 (H) 10/30/2020   LDLDIRECT 164 (H) 10/30/2020   TRIG 262 (H) 10/30/2020   CHOLHDL 8.0 (H) 10/30/2020    --------------------------------------------------------------------------------------------------  ASSESSMENT AND PLAN: Chronic HFrEF due to NICM: Ms. Loose appears euvolemic on exam with NYHA class II symptoms.  Her weight is down 2 pounds since our last visit 2 weeks ago.  We will continue furosemide 40 mg BID, as well as current doses of isosorbide dinitrate, hydralazine, Entresto, and spironolactone.  Ms. Tegtmeyer was  previously on metoprolol, with the medication having fallen off her medication list for unclear reasons.  I think it would be unlikely that the medication caused significant myalgias.  We have agreed to try an alternative beta-blocker: bisoprolol 2.5 mg daily.  Addition of an SGLT-2 inhibitor will also need to be considered in the future.  Once her goal-directed medical therapy has been titrated to maximal tolerated doses, we will need to repeat a limited echo to reassess her LVEF.  Hypertension: Blood pressure well-controlled today and most days at home.  We will add bisoprolol 2.5 mg daily, as above.  No other medication changes.  Hyperlipidemia: Continue atorvastatin per Ms. Strader.  Follow-up: RTC 1 month.  Nelva Bush, MD 12/12/2020 9:39 AM

## 2020-12-12 NOTE — Patient Instructions (Signed)
Medication Instructions:  - Your physician has recommended you make the following change in your medication:   1) START Bisoprolol 5 mg- take 0.5 tablet (2.5 mg) by mouth once daily  *If you need a refill on your cardiac medications before your next appointment, please call your pharmacy*   Lab Work: - Your physician recommends that you have lab work today: BMP   If you have labs (blood work) drawn today and your tests are completely normal, you will receive your results only by: Marland Kitchen MyChart Message (if you have MyChart) OR . A paper copy in the mail If you have any lab test that is abnormal or we need to change your treatment, we will call you to review the results.   Testing/Procedures: - none ordered   Follow-Up: At [facility], you and your health needs are our priority.  As part of our continuing mission to provide you with exceptional heart care, we have created designated Provider Care Teams.  These Care Teams include your primary Cardiologist (physician) and Advanced Practice Providers (APPs -  Physician Assistants and Nurse Practitioners) who all work together to provide you with the care you need, when you need it.  We recommend signing up for the patient portal called "MyChart".  Sign up information is provided on this After Visit Summary.  MyChart is used to connect with patients for Virtual Visits (Telemedicine).  Patients are able to view lab/test results, encounter notes, upcoming appointments, etc.  Non-urgent messages can be sent to your provider as well.   To learn more about what you can do with MyChart, go to [email or web address].    Your next appointment:   1 month(s)  The format for your next appointment:   In Person  Provider:   You may see Nelva Bush, MD or one of the following Advanced Practice Providers on your designated Care Team:    Murray Hodgkins, NP  Christell Faith, PA-C  Marrianne Mood, PA-C  Cadence Kathlen Mody, [place]  Laurann Montana,  NP    Other Instructions  Bisoprolol Tablets What is this medicine? BISOPROLOL (bis OH proe lol) is a beta blocker. It decreases the amount of work your heart has to do and helps your heart beat regularly. It is used to treat high blood pressure. This medicine may be used for other purposes; ask your health care provider or pharmacist if you have questions. COMMON BRAND NAME(S): Zebeta What should I tell my health care provider before I take this medicine? They need to know if you have any of these conditions:  chest pain (angina)  diabetes  heart or vessel disease like slow heart rate, worsening heart failure, heart block, sick sinus syndrome or Raynaud's disease  kidney disease  liver disease  lung or breathing disease, like asthma or emphysema  pheochromocytoma  thyroid disease  an unusual or allergic reaction to bisoprolol, other beta-blockers, medicines, foods, dyes, or preservatives  pregnant or trying to get pregnant  breast-feeding How should I use this medicine? Take this drug by mouth. Take it as directed on the prescription label at the same time every day. You can take it with or without food. If it upsets your stomach, take it with food. Keep taking it unless your health care provider tells you to stop. Talk to your health care provider about the use of this drug in children. Special care may be needed. Overdosage: If you think you have taken too much of this medicine contact a poison control center or emergency  room at once. NOTE: This medicine is only for you. Do not share this medicine with others. What if I miss a dose? If you miss a dose, take it as soon as you can. If it is almost time for your next dose, take only that dose. Do not take double or extra doses. What may interact with this medicine? This medicine may interact with the following medications:  certain medicines for blood pressure, heart disease, irregular heart beat  NSAIDs, medicines for  pain and inflammation, like ibuprofen or naproxen  rifampin This list may not describe all possible interactions. Give your health care provider a list of all the medicines, herbs, non-prescription drugs, or dietary supplements you use. Also tell them if you smoke, drink alcohol, or use illegal drugs. Some items may interact with your medicine. What should I watch for while using this medicine? Visit your doctor or health care professional for regular checks on your progress. Check your heart rate and blood pressure regularly while you are taking this medicine. Ask your doctor or health care professional what your heart rate and blood pressure should be, and when you should contact him or her. You may get drowsy or dizzy. Do not drive, use machinery, or do anything that needs mental alertness until you know how this drug affects you. Do not stand or sit up quickly, especially if you are an older patient. This reduces the risk of dizzy or fainting spells. Alcohol can make you more drowsy and dizzy. Avoid alcoholic drinks. This medicine may increase blood sugar. Ask your healthcare provider if changes in diet or medicines are needed if you have diabetes. Do not treat yourself for coughs, colds, or pain while you are taking this medicine without asking your doctor or health care professional for advice. Some ingredients may increase your blood pressure. What side effects may I notice from receiving this medicine? Side effects that you should report to your doctor or health care professional as soon as possible:  allergic reactions like skin rash, itching or hives, swelling of the face, lips, or tongue  breathing problems  chest pain  cold, tingling, or numb hands or feet  confusion  irregular, slow heartbeat  muscle aches and pains  signs and symptoms of high blood sugar such as being more thirsty or hungry or having to urinate more than normal. You may also feel very tired or have blurry  vision.  sweating  swollen legs or ankles  tremors  vomiting Side effects that usually do not require medical attention (report to your doctor or health care professional if they continue or are bothersome):  anxiety  change in sex drive or performance  depression  diarrhea  dry or burning eyes  headache  nausea This list may not describe all possible side effects. Call your doctor for medical advice about side effects. You may report side effects to FDA at 1-800-FDA-1088. Where should I keep my medicine? Keep out of the reach of children and pets. Store at room temperature between 20 and 25 degrees C (68 and 77 degrees F). Protect from light and moisture. Keep the container tightly closed. Throw away any unused drug after the expiration date. NOTE: This sheet is a summary. It may not cover all possible information. If you have questions about this medicine, talk to your doctor, pharmacist, or health care provider.  2021 Elsevier/Gold Standard (2019-06-01 16:46:40)

## 2020-12-13 ENCOUNTER — Telehealth: Payer: Self-pay | Admitting: Internal Medicine

## 2020-12-13 LAB — BASIC METABOLIC PANEL
BUN/Creatinine Ratio: 9 (ref 9–23)
BUN: 9 mg/dL (ref 6–24)
CO2: 23 mmol/L (ref 20–29)
Calcium: 10.1 mg/dL (ref 8.7–10.2)
Chloride: 99 mmol/L (ref 96–106)
Creatinine, Ser: 1 mg/dL (ref 0.57–1.00)
GFR calc Af Amer: 73 mL/min/{1.73_m2} (ref >59)
GFR calc non Af Amer: 64 mL/min/{1.73_m2} (ref >59)
Glucose: 93 mg/dL (ref 65–99)
Potassium: 4 mmol/L (ref 3.5–5.2)
Sodium: 137 mmol/L (ref 134–144)

## 2020-12-13 NOTE — Telephone Encounter (Signed)
Nelva Bush, MD  12/13/2020 6:46 AM EST      Labs stable. Continue current medications, as discussed at yesterday's office visit.

## 2020-12-13 NOTE — Telephone Encounter (Signed)
Attempted to call the patient. No answer- no voice mail set up.  Will attempt to call at a later time.

## 2020-12-13 NOTE — Telephone Encounter (Signed)
Nuala Alpha, LPN  0/51/1021 1:17 AM EST      Attempted to call the pt back to endorse lab results per Dr. Saunders Revel, and pt did not answer, and there was no voicemail capability.

## 2020-12-16 ENCOUNTER — Encounter: Payer: Self-pay | Admitting: *Deleted

## 2020-12-16 NOTE — Telephone Encounter (Signed)
No answer and VM not set up yet. 3rd attempt to reach patient. Letter with results mailed to patient.

## 2020-12-18 DIAGNOSIS — H524 Presbyopia: Secondary | ICD-10-CM | POA: Diagnosis not present

## 2020-12-18 NOTE — Addendum Note (Signed)
Addended by: Ronaldo Miyamoto on: 12/18/2020 07:51 AM   Modules accepted: Orders

## 2020-12-27 NOTE — Progress Notes (Deleted)
Cardiology Office Note    Date:  12/27/2020   ID:  Lauren Banks, DOB 1966/11/01, MRN [id number]  PCP:  Renee Rival, NP  Cardiologist:  Nelva Bush, MD  Electrophysiologist:  None   Chief Complaint: Follow up  History of Present Illness:   Lauren Banks is a 55 y.o. female with history of normal coronary arteries by cardiac cath in 11/2020, chronic combined systolic and diastolic CHF with an EF of 35 to 40% by echo on 10/23/2020 secondary to NICM, frequent PVCs, Covid infection in 10/2020, HTN, HLD, and tobacco use who presents for follow-up of her cardiomyopathy and PVCs.  She was evaluated by Dr. Saunders Revel as a new patient on 10/02/2020 at the request of her pulmonologist for evaluation of PVCs.  She had been evaluated by pulmonology for cough and shortness of breath that was treated with prednisone with symptomatic improvement.  At her visit with cardiology she reported a prior history of palpitations and previously attributed these to panic attacks.  She also reported intermittent longstanding chest pain.  She reported a prior diagnosis of pleurisy approximately 20 years ago with symptoms of chest discomfort noted to improve with BC powder.  Her pain was described as sharp and left-sided off and underneath the breast.  There were no associated symptoms and her pain was nonexertional.  EKG at her cardiology visit showed normal sinus rhythm with frequent PVCs in a pattern of bigeminy with atrial enlargement and an underlying right bundle branch block.  It was noted that she had normal thyroid function and was on potassium supplementation for previously noted hypokalemia.  She underwent 2D echo on 10/23/2020 which showed an EF of 35 to 40%, global hypokinesis, grade 2 diastolic dysfunction, mildly reduced RV systolic function with a mildly enlarged RV cavity size, mild to moderate mitral regurgitation, mild to moderate aortic valve insufficiency with moderate aortic valve sclerosis with no  evidence of aortic valve stenosis, and a right atrial pressure of 15 mmHg.  Given these findings, she was started on losartan 25 mg daily.  It was noted post echo that her HCTZ had been discontinued by pulmonology and in this setting she noted worsening lower extremity edema.  Given this, she was placed on Lasix 20 mg daily in addition to the above-mentioned losartan.  She was seen in follow up in 10/2020 and reported significant improvement in her symptoms.  She may have been taking 2 different diuretics. She was scheduled for diagnostic R/LHC, though this ended up being delayed secondary her testing positive for Covid during pre-procedure evaluation, though she was asymptomatic and did not require hospitalization. She subsequently underwent R/LHC on 11/19/2020, which showed no significant CAD, but with severely elevated filling pressures and reduced cardiac output.  In this setting, she was admitted and underwent IV diuresis and medication optimization.  She was last seen in the office on 11/27/2020, and was doing well from a cardiac perspective, though was up 4 pounds since her hospital discharge.  She was feeling significantly better than she did when she presented for her cardiac cath.  She did note some improvement in her persistent exertional dyspnea.  Documented weight at that visit was 246 pounds.  Her EKG showed ventricular bigeminy.  Her Lasix was increased to 40 mg bid.   ***   Labs independently reviewed: 11/2020 - BUN 13, SCr 1.08, potassium 3.8, HGB 13.5, PLT 321 10/2020 - direct LDL 164, TC 231, TG 262, HDL 29, magnesium 1.6 09/2020 - TSH normal  Past Medical History:  Diagnosis Date  . Chronic combined systolic (congestive) and diastolic (congestive) heart failure ([place])   . Frequent PVCs   . Hyperlipemia   . Hypertension   . Nonischemic cardiomyopathy [facility])     Past Surgical History:  Procedure Laterality Date  . ABDOMINAL HYSTERECTOMY  10/13/2011   Procedure: HYSTERECTOMY  ABDOMINAL;  Surgeon: Jonnie Kind, MD;  Location: [facility];  Service: Gynecology;  Laterality: N/A;  . RIGHT/LEFT HEART CATH AND CORONARY ANGIOGRAPHY N/A 11/19/2020   Procedure: RIGHT/LEFT HEART CATH AND CORONARY ANGIOGRAPHY;  Surgeon: Nelva Bush, MD;  Location: [facility];  Service: Cardiovascular;  Laterality: N/A;    Current Medications: No outpatient medications have been marked as taking for the 01/10/21 encounter (Appointment) with Rise Mu, PA-C.    Allergies:   Penicillins   Social History   Socioeconomic History  . Marital status: Married    Spouse name: Not on file  . Number of children: Not on file  . Years of education: Not on file  . Highest education level: Not on file  Occupational History  . Not on file  Tobacco Use  . Smoking status: Current Every Day Smoker    Packs/day: 0.25    Years: 30.00    Pack years: 7.50    Types: Cigarettes  . Smokeless tobacco: Never Used  Substance and Sexual Activity  . Alcohol use: No  . Drug use: Yes    Frequency: 7.0 times per week    Types: Marijuana  . Sexual activity: Yes    Birth control/protection: None, Surgical  Other Topics Concern  . Not on file  Social History Narrative  . Not on file   Social Determinants of Health   Financial Resource Strain: Not on file  Food Insecurity: Not on file  Transportation Needs: Not on file  Physical Activity: Not on file  Stress: Not on file  Social Connections: Not on file     Family History:  The patient's family history includes Alzheimer's disease in her mother; Cancer in her brother, maternal aunt, and mother; Diabetes in her father and sister; Gout in her brother and father; Heart disease in her sister; Hypertension in her brother, father, and mother; Kidney disease in her father and maternal grandmother; Obesity in her sister; Stroke in her maternal grandmother and paternal grandmother. There is no history of Anesthesia problems.  ROS:    ROS   EKGs/Labs/Other Studies Reviewed:    Studies reviewed were summarized above. The additional studies were reviewed today:  2D echo 10/23/2020: 1. Left ventricular ejection fraction, by estimation, is 35 to 40%. Left  ventricular ejection fraction by 3D volume is 43 %. The left ventricle has  moderately decreased function. The left ventricle demonstrates global  hypokinesis. Left ventricular  diastolic parameters are consistent with Grade II diastolic dysfunction  (pseudonormalization). The average left ventricular global longitudinal  strain is -10.2 %. The global longitudinal strain is abnormal.  2. Right ventricular systolic function is mildly reduced. The right  ventricular size is mildly enlarged.  3. The mitral valve is normal in structure. Mild to moderate mitral valve  regurgitation.  4. The aortic valve was not well visualized. Aortic valve regurgitation  is mild to moderate. Mild aortic valve sclerosis is present, with no  evidence of aortic valve stenosis.  5. The inferior vena cava is dilated in size with <50% respiratory  variability, suggesting right atrial pressure of 15 mmHg. __________  [facility] 11/19/2020: Conclusions: 1. No  angiographically significant coronary artery disease consistent with nonischemic cardiomyopathy. 2. Severely elevated left heart, right heart, and pulmonary artery pressures. 3. Severely reduced Fick cardiac output/index in the setting of markedly elevated systemic blood pressure.  Recommendations: 1. Admit for diuresis and optimization of goal-directed medical therapy. 2. Consider advanced heart failure consultation, ideally as an outpatient but potentially as transfer to [facility] if patient fails to respond to inotrope therapy and escalation of evidence-based heart failure agents.   EKG:  EKG is ordered today.  The EKG ordered today demonstrates ***  Recent Labs: 09/10/2020: TSH 1.726 10/30/2020: ALT 18; Magnesium  1.6 11/19/2020: Hemoglobin 13.5; Platelets 321 11/20/2020: B Natriuretic Peptide 730.5 12/12/2020: BUN 9; Creatinine, Ser 1.00; Potassium 4.0; Sodium 137  Recent Lipid Panel    Component Value Date/Time   CHOL 231 (H) 10/30/2020 1439   TRIG 262 (H) 10/30/2020 1439   HDL 29 (L) 10/30/2020 1439   CHOLHDL 8.0 (H) 10/30/2020 1439   LDLCALC 153 (H) 10/30/2020 1439   LDLDIRECT 164 (H) 10/30/2020 1439    PHYSICAL EXAM:    VS:  LMP 09/21/2011 Comment: partial  BMI: There is no height or weight on file to calculate BMI.  Physical Exam  Wt Readings from Last 3 Encounters:  12/12/20 244 lb (110.7 kg)  11/27/20 246 lb 8 oz (111.8 kg)  11/21/20 238 lb 4.8 oz (108.1 kg)     ASSESSMENT & PLAN:   1. Chronic combined systolic and diastolic CHF secondary to NICM: ***  2. Frequent PVCs:  3. HLD: LDL 164 with triglycerides 262 from 10/2020.  ***  Disposition: F/u with Dr. Marland Kitchen or an APP in ***.   Medication Adjustments/Labs and Tests Ordered: Current medicines are reviewed at length with the patient today.  Concerns regarding medicines are outlined above. Medication changes, Labs and Tests ordered today are summarized above and listed in the Patient Instructions accessible in Encounters.   Signed, Christell Faith, PA-C 12/27/2020 7:50 AM     [place] [address] [place] [address] [place], [place] [address] [phone]

## 2021-01-07 ENCOUNTER — Other Ambulatory Visit: Payer: Self-pay | Admitting: Internal Medicine

## 2021-01-09 ENCOUNTER — Other Ambulatory Visit: Payer: Self-pay | Admitting: Internal Medicine

## 2021-01-10 ENCOUNTER — Ambulatory Visit: Payer: BC Managed Care – PPO | Admitting: Physician Assistant

## 2021-01-10 NOTE — Telephone Encounter (Signed)
Rx request sent to pharmacy.  

## 2021-01-17 ENCOUNTER — Other Ambulatory Visit: Payer: Self-pay

## 2021-01-17 ENCOUNTER — Encounter: Payer: Self-pay | Admitting: Internal Medicine

## 2021-01-17 ENCOUNTER — Ambulatory Visit (INDEPENDENT_AMBULATORY_CARE_PROVIDER_SITE_OTHER): Payer: BC Managed Care – PPO | Admitting: Internal Medicine

## 2021-01-17 VITALS — BP 92/62 | HR 70 | Ht 70.0 in | Wt 246.0 lb

## 2021-01-17 DIAGNOSIS — I428 Other cardiomyopathies: Secondary | ICD-10-CM

## 2021-01-17 DIAGNOSIS — I5022 Chronic systolic (congestive) heart failure: Secondary | ICD-10-CM

## 2021-01-17 DIAGNOSIS — E782 Mixed hyperlipidemia: Secondary | ICD-10-CM

## 2021-01-17 DIAGNOSIS — I10 Essential (primary) hypertension: Secondary | ICD-10-CM

## 2021-01-17 DIAGNOSIS — I493 Ventricular premature depolarization: Secondary | ICD-10-CM | POA: Diagnosis not present

## 2021-01-17 NOTE — Patient Instructions (Signed)
Medication Instructions:  Your physician recommends that you continue on your current medications as directed. Please refer to the Current Medication list given to you today.  *If you need a refill on your cardiac medications before your next appointment, please call your pharmacy*  Lab Work: none If you have labs (blood work) drawn today and your tests are completely normal, you will receive your results only by: Marland Kitchen MyChart Message (if you have MyChart) OR . A paper copy in the mail If you have any lab test that is abnormal or we need to change your treatment, we will call you to review the results.  Testing/Procedures: Your physician has requested that you have an LIMITED echocardiogram. Echocardiography is a painless test that uses sound waves to create images of your heart. It provides your doctor with information about the size and shape of your heart and how well your heart's chambers and valves are working. This procedure takes approximately one hour. There are no restrictions for this procedure. There is a possibility that an IV may need to be started during your test to inject an image enhancing agent. This is done to obtain more optimal pictures of your heart. Therefore we ask that you do at least drink some water prior to coming in to hydrate your veins.    Follow-Up: At [facility], you and your health needs are our priority.  As part of our continuing mission to provide you with exceptional heart care, we have created designated Provider Care Teams.  These Care Teams include your primary Cardiologist (physician) and Advanced Practice Providers (APPs -  Physician Assistants and Nurse Practitioners) who all work together to provide you with the care you need, when you need it.  We recommend signing up for the patient portal called "MyChart".  Sign up information is provided on this After Visit Summary.  MyChart is used to connect with patients for Virtual Visits (Telemedicine).   Patients are able to view lab/test results, encounter notes, upcoming appointments, etc.  Non-urgent messages can be sent to your provider as well.   To learn more about what you can do with MyChart, go to [email or web address].    Your next appointment:   3 month(s)  The format for your next appointment:   In Person  Provider:   You may see Nelva Bush, MD or one of the following Advanced Practice Providers on your designated Care Team:    Murray Hodgkins, NP  Christell Faith, PA-C  Marrianne Mood, PA-C  Cadence Nettie, [place]  Laurann Montana, NP    Echocardiogram An echocardiogram is a test that uses sound waves (ultrasound) to produce images of the heart. Images from an echocardiogram can provide important information about:  Heart size and shape.  The size and thickness and movement of your heart's walls.  Heart muscle function and strength.  Heart valve function or if you have stenosis. Stenosis is when the heart valves are too narrow.  If blood is flowing backward through the heart valves (regurgitation).  A tumor or infectious growth around the heart valves.  Areas of heart muscle that are not working well because of poor blood flow or injury from a heart attack.  Aneurysm detection. An aneurysm is a weak or damaged part of an artery wall. The wall bulges out from the normal force of blood pumping through the body. Tell a health care provider about:  Any allergies you have.  All medicines you are taking, including vitamins, herbs, eye drops, creams, and  over-the-counter medicines.  Any blood disorders you have.  Any surgeries you have had.  Any medical conditions you have.  Whether you are pregnant or may be pregnant. What are the risks? Generally, this is a safe test. However, problems may occur, including an allergic reaction to dye (contrast) that may be used during the test. What happens before the test? No specific preparation is needed. You may  eat and drink normally. What happens during the test?  You will take off your clothes from the waist up and put on a hospital gown.  Electrodes or electrocardiogram (ECG)patches may be placed on your chest. The electrodes or patches are then connected to a device that monitors your heart rate and rhythm.  You will lie down on a table for an ultrasound exam. A gel will be applied to your chest to help sound waves pass through your skin.  A handheld device, called a transducer, will be pressed against your chest and moved over your heart. The transducer produces sound waves that travel to your heart and bounce back (or "echo" back) to the transducer. These sound waves will be captured in real-time and changed into images of your heart that can be viewed on a video monitor. The images will be recorded on a computer and reviewed by your health care provider.  You may be asked to change positions or hold your breath for a short time. This makes it easier to get different views or better views of your heart.  In some cases, you may receive contrast through an IV in one of your veins. This can improve the quality of the pictures from your heart. The procedure may vary among health care providers and hospitals.   What can I expect after the test? You may return to your normal, everyday life, including diet, activities, and medicines, unless your health care provider tells you not to do that. Follow these instructions at home:  It is up to you to get the results of your test. Ask your health care provider, or the department that is doing the test, when your results will be ready.  Keep all follow-up visits. This is important. Summary  An echocardiogram is a test that uses sound waves (ultrasound) to produce images of the heart.  Images from an echocardiogram can provide important information about the size and shape of your heart, heart muscle function, heart valve function, and other possible heart  problems.  You do not need to do anything to prepare before this test. You may eat and drink normally.  After the echocardiogram is completed, you may return to your normal, everyday life, unless your health care provider tells you not to do that. This information is not intended to replace advice given to you by your health care provider. Make sure you discuss any questions you have with your health care provider. Document Revised: 06/11/2020 Document Reviewed: 06/11/2020 Elsevier Patient Education  2021 [employer].

## 2021-01-17 NOTE — Progress Notes (Signed)
Follow-up Outpatient Visit Date: 01/17/2021  Primary Care Provider: [place], [place] [address]  Chief Complaint: Follow-up heart failure  HPI:  Lauren Banks is a 55 y.o. female with history of chronic HFrEF due to nonischemic cardiomyopathy, HTN, HLD, frequent PVCs, and tobacco use, who presents for follow-up of heart failure.  I last saw Lauren Banks on 12/12/2020, at which time she was feeling quite well.  Due to questions regarding myalgias from metoprolol, we agreed to start bisoprolol 2.5 mg daily.  Today, Lauren Banks reports that she is feeling well.  She has tolerated addition of bisoprolol without any side effects.  It is notable that her medication list today includes both metoprolol succinate and bisoprolol, though Lauren Banks is unsure if she is taking one or both of these agents.  She denies chest pain, shortness of breath, palpitations, lightheadedness, and orthopnea.  She has some occasional swelling in the right ankle joint but otherwise no edema.  Her weight fluctuates some.  She is trying to follow a low-sodium diet.  She is walking a little bit more as part of her daily routine but does not exercise on a regular basis.  --------------------------------------------------------------------------------------------------  Past Medical History:  Diagnosis Date  . Chronic combined systolic (congestive) and diastolic (congestive) heart failure ([place])   . Frequent PVCs   . Hyperlipemia   . Hypertension   . Nonischemic cardiomyopathy [facility])    Past Surgical History:  Procedure Laterality Date  . ABDOMINAL HYSTERECTOMY  10/13/2011   Procedure: HYSTERECTOMY ABDOMINAL;  Surgeon: Jonnie Kind, MD;  Location: [facility];  Service: Gynecology;  Laterality: N/A;  . RIGHT/LEFT HEART CATH AND CORONARY ANGIOGRAPHY N/A 11/19/2020   Procedure: RIGHT/LEFT HEART CATH AND CORONARY ANGIOGRAPHY;  Surgeon: Nelva Bush, MD;  Location: [place] [facility];  Service:  Cardiovascular;  Laterality: N/A;    Current Meds  Medication Sig  . albuterol (VENTOLIN HFA) 108 (90 Base) MCG/ACT inhaler Inhale 2 puffs into the lungs every 4 (four) hours as needed for wheezing or shortness of breath.  Marland Kitchen atorvastatin (LIPITOR) 40 MG tablet Take 1 tablet (40 mg total) by mouth daily.  . bisoprolol (ZEBETA) 5 MG tablet Take 0.5 tablets (2.5 mg total) by mouth daily.  Marland Kitchen buPROPion (WELLBUTRIN XL) 300 MG 24 hr tablet Take 300 mg by mouth daily.  Marland Kitchen escitalopram (LEXAPRO) 20 MG tablet Take 20 mg by mouth daily.  . famotidine (PEPCID) 20 MG tablet One after supper (Patient taking differently: Take 20 mg by mouth daily. One after supper)  . fluticasone (FLONASE) 50 MCG/ACT nasal spray Place 1 spray into both nostrils daily as needed for allergies or rhinitis.  . furosemide (LASIX) 40 MG tablet Take 1 tablet (40 mg total) by mouth 2 (two) times daily. (Patient taking differently: Take 40 mg by mouth daily.)  . hydrALAZINE (APRESOLINE) 25 MG tablet TAKE 1 & 1/2 TABLETS BY MOUTH THREE TIMES DAILY  . ipratropium-albuterol (DUONEB) 0.5-2.5 (3) MG/3ML SOLN Inhale 3 mLs into the lungs every 4 (four) hours as needed (Asthma).  . isosorbide dinitrate (ISORDIL) 20 MG tablet Take 20 mg by mouth 3 (three) times daily.  . Magnesium Oxide 400 MG CAPS Take 1 capsule (400 mg total) by mouth daily.  . metoprolol succinate (TOPROL-XL) 25 MG 24 hr tablet Take 25 mg by mouth in the morning and at bedtime.  . pantoprazole (PROTONIX) 40 MG tablet Take 1 tablet (40 mg total) by mouth daily. Take 30-60 min before first  meal of the day  . potassium chloride SA (KLOR-CON) 20 MEQ tablet   . sacubitril-valsartan (ENTRESTO) 49-51 MG Take 1 tablet by mouth 2 (two) times daily.  Marland Kitchen spironolactone (ALDACTONE) 25 MG tablet Take 1 tablet (25 mg total) by mouth daily.  . [DISCONTINUED] metoprolol succinate (TOPROL-XL) 25 MG 24 hr tablet Take 25 mg by mouth 2 (two) times daily.    Allergies: Penicillins  Social  History   Tobacco Use  . Smoking status: Current Every Day Smoker    Packs/day: 0.25    Years: 30.00    Pack years: 7.50    Types: Cigarettes  . Smokeless tobacco: Never Used  Substance Use Topics  . Alcohol use: No  . Drug use: Yes    Frequency: 7.0 times per week    Types: Marijuana    Family History  Problem Relation Age of Onset  . Cancer Mother        breast- around age 90, ovarian- around age58  . Hypertension Mother   . Alzheimer's disease Mother   . Hypertension Father   . Diabetes Father   . Kidney disease Father   . Gout Father   . Stroke Maternal Grandmother   . Kidney disease Maternal Grandmother   . Stroke Paternal Grandmother   . Diabetes Sister   . Obesity Sister   . Heart disease Sister   . Hypertension Brother   . Cancer Brother        lymphnode  . Gout Brother   . Cancer Maternal Aunt   . Anesthesia problems Neg Hx     Review of Systems: A 12-system review of systems was performed and was negative except as noted in the HPI.  --------------------------------------------------------------------------------------------------  Physical Exam: BP 92/62 (BP Location: Left Arm, Patient Position: Sitting, Cuff Size: Normal)   Pulse 70   Ht 5\' 10"  (1.778 m)   Wt 246 lb (111.6 kg)   LMP 09/21/2011 Comment: partial  SpO2 98%   BMI 35.30 kg/m   General:  NAD. Neck: No JVD or HJR. Lungs: Clear to auscultation bilaterally without wheezes or crackles. Heart: Regular rate and rhythm without murmurs, rubs, or gallops. Abdomen: Soft, nontender, nondistended. Extremities: No lower extremity edema.  Lab Results  Component Value Date   WBC 10.9 (H) 11/19/2020   HGB 13.5 11/19/2020   HCT 41.2 11/19/2020   MCV 82.7 11/19/2020   PLT 321 11/19/2020    Lab Results  Component Value Date   NA 137 12/12/2020   K 4.0 12/12/2020   CL 99 12/12/2020   CO2 23 12/12/2020   BUN 9 12/12/2020   CREATININE 1.00 12/12/2020   GLUCOSE 93 12/12/2020   ALT 18  10/30/2020    Lab Results  Component Value Date   CHOL 231 (H) 10/30/2020   HDL 29 (L) 10/30/2020   LDLCALC 153 (H) 10/30/2020   LDLDIRECT 164 (H) 10/30/2020   TRIG 262 (H) 10/30/2020   CHOLHDL 8.0 (H) 10/30/2020    --------------------------------------------------------------------------------------------------  ASSESSMENT AND PLAN: Chronic HFrEF due to nonischemic cardiomyopathy: Lauren Banks appears euvolemic on exam today with NYHA class I-II heart failure symptoms, significantly improved since late last year.  LVEF in 10/2020 was moderately reduced at 35-40% with subsequent catheterization showing severely elevated filling pressures.  I am reluctant to escalate her GDMT today given the low normal blood pressure today (home readings are typically higher).  I have asked her to check her medications at home and to reach out to [place] to let us  know if she is taking bisoprolol, metoprolol succinate, or both.  If she is currently on both, I would favor stopping bisoprolol and escalating metoprolol succinate in the future if blood pressure and heart rate allow.  We will defer escalation of hydralazine, isosorbide dinitrate, Entresto, and spironolactone.  We will plan to continue furosemide 40 mg twice daily, though if Lauren Banks has more frequent hypotension or develops orthostatic lightheadedness, I think it would be fine for her to decrease this to 40 mg daily as long as her weight remains stable.  We will plan to repeat a limited echo at Lauren Banks's convenience, as it is now almost 3 months from the time of her initial echocardiogram.  If LVEF has not improved at all, we will arrange for referral to the advanced heart failure clinic in [place].  PVCs: No palpitations noted.  Continue beta-blockade, as above.  If LVEF has not improved at all, we may need to perform ambulatory cardiac monitoring to assess PVC burden in case her cardiomyopathy is being driven by this.  Hypertension: Blood pressure  is low normal today, albeit asymptomatic.  No medication changes at this time, though we will clarify her beta-blocker regimen as outlined above.  Hyperlipidemia: Continue atorvastatin with ongoing management per Lauren Banks.  Follow-up: Return to clinic in 3 months.  Nelva Bush, MD 01/17/2021 8:26 AM

## 2021-02-04 ENCOUNTER — Other Ambulatory Visit: Payer: Self-pay | Admitting: Physician Assistant

## 2021-02-05 ENCOUNTER — Ambulatory Visit (INDEPENDENT_AMBULATORY_CARE_PROVIDER_SITE_OTHER): Payer: BC Managed Care – PPO

## 2021-02-05 ENCOUNTER — Other Ambulatory Visit: Payer: Self-pay

## 2021-02-05 DIAGNOSIS — I5022 Chronic systolic (congestive) heart failure: Secondary | ICD-10-CM

## 2021-02-05 DIAGNOSIS — I428 Other cardiomyopathies: Secondary | ICD-10-CM | POA: Diagnosis not present

## 2021-02-05 LAB — ECHOCARDIOGRAM LIMITED
Area-P 1/2: 2.9 cm2
S' Lateral: 4.5 cm
Single Plane A4C EF: 43 %

## 2021-02-05 NOTE — Telephone Encounter (Signed)
Please advise if ok to refill Magnesium 400 mg tablet. Medication last filled by [facility]

## 2021-02-05 NOTE — Telephone Encounter (Signed)
Refilled as requested  

## 2021-03-03 ENCOUNTER — Telehealth: Payer: Self-pay | Admitting: Internal Medicine

## 2021-03-03 MED ORDER — SPIRONOLACTONE 25 MG PO TABS: 25.0000 mg | ORAL_TABLET | Freq: Every day | ORAL | 6 refills | Status: DC

## 2021-03-03 NOTE — Telephone Encounter (Signed)
Received fax from [facility] requesting refills for Spironolactone 25 mg. Rx request sent to pharmacy.

## 2021-03-04 ENCOUNTER — Other Ambulatory Visit: Payer: Self-pay | Admitting: Internal Medicine

## 2021-03-04 NOTE — Telephone Encounter (Signed)
Rx request sent to pharmacy.  

## 2021-04-04 ENCOUNTER — Other Ambulatory Visit: Payer: Self-pay | Admitting: Internal Medicine

## 2021-04-15 NOTE — Progress Notes (Deleted)
Cardiology Office Note    Date:  04/15/2021   ID:  Lauren Banks, DOB 12/03/1965, MRN [id number]  PCP:  Renee Rival, NP  Cardiologist:  Nelva Bush, MD  Electrophysiologist:  None   Chief Complaint: Follow-up  History of Present Illness:   Lauren Banks is a 55 y.o. female with history of HFrEF secondary to NICM, frequent PVCs, HTN, HLD, and tobacco use who presents for follow-up of her cardiomyopathy.  Echo in 10/2020 showed an EF of 35 to 40%, global hypokinesis, grade 2 diastolic dysfunction, mildly reduced RV systolic function with mildly enlarged RV cavity size, mild to moderate mitral regurgitation, mild to moderate aortic valve insufficiency, mild aortic valve sclerosis without evidence of stenosis, and an estimated right atrial pressure of 15 mmHg.  Subsequent diagnostic R/LHC in 11/2020 demonstrated no angiographically significant CAD with severely elevated left heart, right heart, and pulmonary artery pressures and severely reduced cardiac output/index in the setting of markedly elevated systemic blood pressure.  She was admitted for IV diuresis and optimization of GDMT.  She was last seen in the office in 12/2020 and was tolerating bisoprolol which had recently been started in place of metoprolol secondary to possible myalgias.  Her BP was soft at 92/62 and she was euvolemic.  It was noted she had both metoprolol and bisoprolol on her medication list though she was certain she was taking just bisoprolol.  Subsequent echo on GDMT in 01/2021 showed an improvement in her LV SF with an EF of 45 to 50%, no regional wall motion abnormalities, low normal RV systolic function with normal RV cavity size, trivial mitral regurgitation, and trivial aortic insufficiency.  Continued medical therapy was recommended  ***   Labs independently reviewed: 12/2020 - BUN 9, serum creatinine 1.0, potassium 4.0 11/2020 - Hgb 13.5, PLT 321 10/2020 - direct LDL 164, TC 231, TG 262, HDL 29, magnesium  1.6, albumin 4.0, AST/ALT normal 09/2020 - TSH normal  Past Medical History:  Diagnosis Date   Chronic combined systolic (congestive) and diastolic (congestive) heart failure ([place])    Frequent PVCs    Hyperlipemia    Hypertension    Nonischemic cardiomyopathy [facility])     Past Surgical History:  Procedure Laterality Date   ABDOMINAL HYSTERECTOMY  10/13/2011   Procedure: HYSTERECTOMY ABDOMINAL;  Surgeon: Jonnie Kind, MD;  Location: [facility];  Service: Gynecology;  Laterality: N/A;   RIGHT/LEFT HEART CATH AND CORONARY ANGIOGRAPHY N/A 11/19/2020   Procedure: RIGHT/LEFT HEART CATH AND CORONARY ANGIOGRAPHY;  Surgeon: Nelva Bush, MD;  Location: [place] [facility];  Service: Cardiovascular;  Laterality: N/A;    Current Medications: No outpatient medications have been marked as taking for the 04/21/21 encounter (Appointment) with Rise Mu, PA-C.    Allergies:   Penicillins   Social History   Socioeconomic History   Marital status: Married    Spouse name: Not on file   Number of children: Not on file   Years of education: Not on file   Highest education level: Not on file  Occupational History   Not on file  Tobacco Use   Smoking status: Every Day    Packs/day: 0.25    Years: 30.00    Pack years: 7.50    Types: Cigarettes   Smokeless tobacco: Never  Substance and Sexual Activity   Alcohol use: No   Drug use: Yes    Frequency: 7.0 times per week    Types: Marijuana   Sexual activity: Yes  Birth control/protection: None, Surgical  Other Topics Concern   Not on file  Social History Narrative   Not on file   Social Determinants of Health   Financial Resource Strain: Not on file  Food Insecurity: Not on file  Transportation Needs: Not on file  Physical Activity: Not on file  Stress: Not on file  Social Connections: Not on file     Family History:  The patient's family history includes Alzheimer's disease in her mother; Cancer in her brother, maternal  aunt, and mother; Diabetes in her father and sister; Gout in her brother and father; Heart disease in her sister; Hypertension in her brother, father, and mother; Kidney disease in her father and maternal grandmother; Obesity in her sister; Stroke in her maternal grandmother and paternal grandmother. There is no history of Anesthesia problems.  ROS:   ROS   EKGs/Labs/Other Studies Reviewed:    Studies reviewed were summarized above. The additional studies were reviewed today:  2D echo 10/23/2020: 1. Left ventricular ejection fraction, by estimation, is 35 to 40%. Left  ventricular ejection fraction by 3D volume is 43 %. The left ventricle has  moderately decreased function. The left ventricle demonstrates global  hypokinesis. Left ventricular  diastolic parameters are consistent with Grade II diastolic dysfunction  (pseudonormalization). The average left ventricular global longitudinal  strain is -10.2 %. The global longitudinal strain is abnormal.   2. Right ventricular systolic function is mildly reduced. The right  ventricular size is mildly enlarged.   3. The mitral valve is normal in structure. Mild to moderate mitral valve  regurgitation.   4. The aortic valve was not well visualized. Aortic valve regurgitation  is mild to moderate. Mild aortic valve sclerosis is present, with no  evidence of aortic valve stenosis.   5. The inferior vena cava is dilated in size with <50% respiratory  variability, suggesting right atrial pressure of 15 mmHg.  __________  [facility] 11/19/2020: Conclusions: No angiographically significant coronary artery disease consistent with nonischemic cardiomyopathy. Severely elevated left heart, right heart, and pulmonary artery pressures. Severely reduced Fick cardiac output/index in the setting of markedly elevated systemic blood pressure.   Recommendations: Admit for diuresis and optimization of goal-directed medical therapy. Consider advanced heart  failure consultation, ideally as an outpatient but potentially as transfer to [facility] if patient fails to respond to inotrope therapy and escalation of evidence-based heart failure agents. __________  Limited 2D echo 02/05/2021: 1. Left ventricular ejection fraction, by estimation, is 45 to 50%. The  left ventricle has mildly decreased function. The left ventricle has no  regional wall motion abnormalities.   2. Right ventricular systolic function is low normal. The right  ventricular size is normal.   3. The mitral valve is normal in structure. Trivial mitral valve  regurgitation.   4. The aortic valve is tricuspid. Aortic valve regurgitation is trivial.   EKG:  EKG is ordered today.  The EKG ordered today demonstrates ***  Recent Labs: 09/10/2020: TSH 1.726 10/30/2020: ALT 18; Magnesium 1.6 11/19/2020: Hemoglobin 13.5; Platelets 321 11/20/2020: B Natriuretic Peptide 730.5 12/12/2020: BUN 9; Creatinine, Ser 1.00; Potassium 4.0; Sodium 137  Recent Lipid Panel    Component Value Date/Time   CHOL 231 (H) 10/30/2020 1439   TRIG 262 (H) 10/30/2020 1439   HDL 29 (L) 10/30/2020 1439   CHOLHDL 8.0 (H) 10/30/2020 1439   LDLCALC 153 (H) 10/30/2020 1439   LDLDIRECT 164 (H) 10/30/2020 1439    PHYSICAL EXAM:    VS:  LMP 09/21/2011 Comment: partial  BMI: There is no height or weight on file to calculate BMI.  Physical Exam  Wt Readings from Last 3 Encounters:  01/17/21 246 lb (111.6 kg)  12/12/20 244 lb (110.7 kg)  11/27/20 246 lb 8 oz (111.8 kg)     ASSESSMENT & PLAN:   HFrEF secondary to NICM:  Frequent PVCs:  HTN: Blood pressure ***  HLD: LDL 164 in 10/2020.  Disposition: F/u with Dr. Saunders Revel or an APP in ***.   Medication Adjustments/Labs and Tests Ordered: Current medicines are reviewed at length with the patient today.  Concerns regarding medicines are outlined above. Medication changes, Labs and Tests ordered today are summarized above and listed in the Patient  Instructions accessible in Encounters.   Signed, Christell Faith, PA-C 04/15/2021 1:54 PM     [address], [place] [address] [phone]

## 2021-04-16 DIAGNOSIS — D72829 Elevated white blood cell count, unspecified: Secondary | ICD-10-CM | POA: Diagnosis not present

## 2021-04-16 DIAGNOSIS — Z720 Tobacco use: Secondary | ICD-10-CM | POA: Diagnosis not present

## 2021-04-16 DIAGNOSIS — Z713 Dietary counseling and surveillance: Secondary | ICD-10-CM | POA: Diagnosis not present

## 2021-04-16 DIAGNOSIS — E782 Mixed hyperlipidemia: Secondary | ICD-10-CM | POA: Diagnosis not present

## 2021-04-16 DIAGNOSIS — R609 Edema, unspecified: Secondary | ICD-10-CM | POA: Diagnosis not present

## 2021-04-21 ENCOUNTER — Ambulatory Visit: Payer: BC Managed Care – PPO | Admitting: Physician Assistant

## 2021-05-03 ENCOUNTER — Other Ambulatory Visit: Payer: Self-pay | Admitting: Internal Medicine

## 2021-05-06 ENCOUNTER — Other Ambulatory Visit: Payer: Self-pay | Admitting: *Deleted

## 2021-05-06 MED ORDER — ATORVASTATIN CALCIUM 40 MG PO TABS: 40.0000 mg | ORAL_TABLET | Freq: Every day | ORAL | 0 refills | Status: DC

## 2021-05-06 MED ORDER — SACUBITRIL-VALSARTAN 49-51 MG PO TABS: 1.0000 | ORAL_TABLET | Freq: Two times a day (BID) | ORAL | 0 refills | Status: DC

## 2021-05-06 MED ORDER — ISOSORBIDE DINITRATE 20 MG PO TABS: 20.0000 mg | ORAL_TABLET | Freq: Three times a day (TID) | ORAL | 0 refills | Status: DC

## 2021-05-06 NOTE — Telephone Encounter (Signed)
Please advise if OK to refill. Potassium listed on med list and on last AVS with no directions. Also listed under historical provider. Thank you!

## 2021-05-21 DIAGNOSIS — F419 Anxiety disorder, unspecified: Secondary | ICD-10-CM | POA: Diagnosis not present

## 2021-05-22 ENCOUNTER — Ambulatory Visit (INDEPENDENT_AMBULATORY_CARE_PROVIDER_SITE_OTHER): Payer: BC Managed Care – PPO | Admitting: Internal Medicine

## 2021-05-22 ENCOUNTER — Ambulatory Visit (INDEPENDENT_AMBULATORY_CARE_PROVIDER_SITE_OTHER): Payer: BC Managed Care – PPO

## 2021-05-22 ENCOUNTER — Encounter: Payer: Self-pay | Admitting: Internal Medicine

## 2021-05-22 ENCOUNTER — Other Ambulatory Visit: Payer: Self-pay

## 2021-05-22 VITALS — BP 94/50 | HR 67 | Ht 70.0 in | Wt 261.0 lb

## 2021-05-22 DIAGNOSIS — I493 Ventricular premature depolarization: Secondary | ICD-10-CM

## 2021-05-22 DIAGNOSIS — I10 Essential (primary) hypertension: Secondary | ICD-10-CM

## 2021-05-22 DIAGNOSIS — I5022 Chronic systolic (congestive) heart failure: Secondary | ICD-10-CM | POA: Diagnosis not present

## 2021-05-22 DIAGNOSIS — I428 Other cardiomyopathies: Secondary | ICD-10-CM | POA: Diagnosis not present

## 2021-05-22 DIAGNOSIS — E782 Mixed hyperlipidemia: Secondary | ICD-10-CM

## 2021-05-22 MED ORDER — HYDRALAZINE HCL 25 MG PO TABS: 25.0000 mg | ORAL_TABLET | Freq: Three times a day (TID) | ORAL | 3 refills | Status: DC

## 2021-05-22 MED ORDER — ISOSORBIDE DINITRATE 20 MG PO TABS: 10.0000 mg | ORAL_TABLET | Freq: Three times a day (TID) | ORAL | Status: DC

## 2021-05-22 MED ORDER — ISOSORBIDE DINITRATE 10 MG PO TABS: 10.0000 mg | ORAL_TABLET | Freq: Three times a day (TID) | ORAL | 3 refills | Status: DC

## 2021-05-22 NOTE — Progress Notes (Signed)
Follow-up Outpatient Visit Date: 05/22/2021  Primary Care Provider: [place]enee Rival, NP [place] [address]  Chief Complaint: Follow-up heart failure  HPI:  Ms. Melecio is a 55 y.o. female with history of chronic HFrEF due to nonischemic cardiomyopathy, hypertension, hyperlipidemia, frequent PVCs, and tobacco use, who presents for follow-up of heart failure.  I last saw her in March, at which time Ms. Dante was feeling well.  Limited echo in April showed improving left ventricular systolic function, being increased from 35-40% to 45-50%.  Today, Ms. Kamaka reports that she has noticed occasional dizziness in the morning after taking her medications.  She also has intermittent headaches, which make her feel like she is dehydrated.  Home SBP's have been 90-115 mmHg.  She denies chest pain, shortness of breath, palpitations, and edema.  She is compliant with her medications.  --------------------------------------------------------------------------------------------------  Past Medical History:  Diagnosis Date   Chronic combined systolic (congestive) and diastolic (congestive) heart failure ([facility])    Frequent PVCs    Hyperlipemia    Hypertension    Nonischemic cardiomyopathy [facility])    Past Surgical History:  Procedure Laterality Date   ABDOMINAL HYSTERECTOMY  10/13/2011   Procedure: HYSTERECTOMY ABDOMINAL;  Surgeon: Jonnie Kind, MD;  Location: [facility];  Service: Gynecology;  Laterality: N/A;   CARDIAC CATHETERIZATION     RIGHT/LEFT HEART CATH AND CORONARY ANGIOGRAPHY N/A 11/19/2020   Procedure: RIGHT/LEFT HEART CATH AND CORONARY ANGIOGRAPHY;  Surgeon: Nelva Bush, MD;  Location: [place] [facility];  Service: Cardiovascular;  Laterality: N/A;    Current Meds  Medication Sig   albuterol (VENTOLIN HFA) 108 (90 Base) MCG/ACT inhaler Inhale 2 puffs into the lungs every 4 (four) hours as needed for wheezing or shortness of breath.   atorvastatin (LIPITOR) 40 MG  tablet Take 1 tablet (40 mg total) by mouth daily.   bisoprolol (ZEBETA) 5 MG tablet Take 0.5 tablets (2.5 mg total) by mouth daily.   buPROPion (WELLBUTRIN XL) 300 MG 24 hr tablet Take 300 mg by mouth daily.   escitalopram (LEXAPRO) 20 MG tablet Take 20 mg by mouth daily.   famotidine (PEPCID) 20 MG tablet One after supper   fluticasone (FLONASE) 50 MCG/ACT nasal spray Place 1 spray into both nostrils daily as needed for allergies or rhinitis.   furosemide (LASIX) 40 MG tablet TAKE 1 TABLET BY MOUTH TWICE DAILY   hydrALAZINE (APRESOLINE) 25 MG tablet TAKE 1 & 1/2 TABLETS BY MOUTH THREE TIMES DAILY   ipratropium-albuterol (DUONEB) 0.5-2.5 (3) MG/3ML SOLN Inhale 3 mLs into the lungs every 4 (four) hours as needed (Asthma).   isosorbide dinitrate (ISORDIL) 20 MG tablet Take 1 tablet (20 mg total) by mouth 3 (three) times daily.   magnesium oxide (MAG-OX) 400 (241.3 Mg) MG tablet TAKE 1 TABLET BY MOUTH ONCE DAILY.   metoprolol succinate (TOPROL-XL) 25 MG 24 hr tablet TAKE 1 TABLET BY MOUTH IN THE MORNING AND AT BEDTIME   pantoprazole (PROTONIX) 40 MG tablet Take 1 tablet (40 mg total) by mouth daily. Take 30-60 min before first meal of the day   potassium chloride SA (KLOR-CON) 20 MEQ tablet TAKE 1 TABLET BY MOUTH ONCE DAILY.   sacubitril-valsartan (ENTRESTO) 49-51 MG Take 1 tablet by mouth 2 (two) times daily.   spironolactone (ALDACTONE) 25 MG tablet Take 1 tablet (25 mg total) by mouth daily.   venlafaxine XR (EFFEXOR-XR) 37.5 MG 24 hr capsule Take 37.5 mg by mouth daily.    Allergies: Penicillins  Social History   Tobacco Use   Smoking status: Every Day    Packs/day: 0.25    Years: 30.00    Pack years: 7.50    Types: Cigarettes   Smokeless tobacco: Never  Vaping Use   Vaping Use: Former  Substance Use Topics   Alcohol use: No   Drug use: Yes    Frequency: 7.0 times per week    Types: Marijuana    Family History  Problem Relation Age of Onset   Cancer Mother        breast-  around age 31, ovarian- around age38   Hypertension Mother    Alzheimer's disease Mother    Hypertension Father    Diabetes Father    Kidney disease Father    Gout Father    Stroke Maternal Grandmother    Kidney disease Maternal Grandmother    Stroke Paternal Grandmother    Diabetes Sister    Obesity Sister    Heart disease Sister    Hypertension Brother    Cancer Brother        lymphnode   Gout Brother    Cancer Maternal Aunt    Anesthesia problems Neg Hx     Review of Systems: A 12-system review of systems was performed and was negative except as noted in the HPI.  --------------------------------------------------------------------------------------------------  Physical Exam: BP (!) 94/50 (BP Location: Left Arm, Patient Position: Sitting, Cuff Size: Large)   Pulse 67   Ht 5\' 10"  (1.778 m)   Wt 261 lb (118.4 kg)   LMP 09/21/2011 Comment: partial  SpO2 98%   BMI 37.45 kg/m   General:  NAD. Neck: No JVD or HJR. Lungs: Clear to auscultation bilaterally without wheezes or crackles. Heart: Regular rate and rhythm without murmurs, rubs, or gallops. Abdomen: Soft, nontender, nondistended. Extremities: No lower extremity edema.  EKG:  Normal sinus rhythm with frequent PVC's (ventricular bigeminy), RBBB, and possible inferior MI.  Compared with prior tracing from 12/12/2020, frequent PVC's are now present).  Lab Results  Component Value Date   WBC 10.9 (H) 11/19/2020   HGB 13.5 11/19/2020   HCT 41.2 11/19/2020   MCV 82.7 11/19/2020   PLT 321 11/19/2020    Lab Results  Component Value Date   NA 137 12/12/2020   K 4.0 12/12/2020   CL 99 12/12/2020   CO2 23 12/12/2020   BUN 9 12/12/2020   CREATININE 1.00 12/12/2020   GLUCOSE 93 12/12/2020   ALT 18 10/30/2020    Lab Results  Component Value Date   CHOL 231 (H) 10/30/2020   HDL 29 (L) 10/30/2020   LDLCALC 153 (H) 10/30/2020   LDLDIRECT 164 (H) 10/30/2020   TRIG 262 (H) 10/30/2020   CHOLHDL 8.0 (H)  10/30/2020    --------------------------------------------------------------------------------------------------  ASSESSMENT AND PLAN: Chronic HFrEF due to NICM: Ms. Fullington appears euvolemic with NYHA class II symptoms.  She notes occasional dizziness and headaches accompanied by borderline low BP.  We have agreed to decrease hydralazine to 25 mg TID and isosorbide dinitrate to 10 mg TID.  She again is uncertain as to whether she is taking metoprolol, bisoprolol, or both.  We will have her clarify with [place] after going home; if she is on both, I favor stopping bisoprolol and continuing metoprolol only.  Continue current doses of Entresto and spironolactone.  Frequent PVC's: Ventricular bigeminy again noted today.  Given that LVEF has not normalized, we will obtain a 3 day event monitor to assess PVC burden and consider EP  referral if frequent PVC's are confirmed.  Hypertension: BP low today, similar to home readings.  We will decrease hydralazine and isosorbide dinitrate, as above, as well as clarify beta-blocker dosing.  Hyperlipidemia: Continue atorvastatin with ongoing management per PCP.  Follow-up: Return to clinic in 6 weeks.  Nelva Bush, MD 05/22/2021 10:11 AM

## 2021-05-22 NOTE — Patient Instructions (Signed)
Medication Instructions:   Your physician has recommended you make the following change in your medication:   DECREASE Hydralazine 25mg  THREE times daily  DECREASE Isosorbide Dinitrate 10mg  THREE times daily - May take HALF tablet of current dose  *If you need a refill on your cardiac medications before your next appointment, please call your pharmacy*   Lab Work:  None ordered  Testing/Procedures:  Your physician has recommended that you wear a Zio XT monitor for AT LEAST 3 DAYS.   This monitor is a medical device that records the heart's electrical activity. Doctors most often use these monitors to diagnose arrhythmias. Arrhythmias are problems with the speed or rhythm of the heartbeat. The monitor is a small device applied to your chest. You can wear one while you do your normal daily activities. While wearing this monitor if you have any symptoms to push the button and record what you felt. Once you have worn this monitor for the period of time provider prescribed (Usually 14 days), you will return the monitor device in the postage paid box. Once it is returned they will download the data collected and provide [place] with a report which the provider will then review and we will call you with those results. Important tips:  Avoid showering during the first 24 hours of wearing the monitor. Avoid excessive sweating to help maximize wear time. Do not submerge the device, no hot tubs, and no swimming pools. Keep any lotions or oils away from the patch. After 24 hours you may shower with the patch on. Take brief showers with your back facing the shower head.  Do not remove patch once it has been placed because that will interrupt data and decrease adhesive wear time. Push the button when you have any symptoms and write down what you were feeling. Once you have completed wearing your monitor, remove and place into box which has postage paid and place in your outgoing mailbox.  If for some  reason you have misplaced your box then call our office and we can provide another box and/or mail it off for you.      Follow-Up: At [facility], you and your health needs are our priority.  As part of our continuing mission to provide you with exceptional heart care, we have created designated Provider Care Teams.  These Care Teams include your primary Cardiologist (physician) and Advanced Practice Providers (APPs -  Physician Assistants and Nurse Practitioners) who all work together to provide you with the care you need, when you need it.  We recommend signing up for the patient portal called "MyChart".  Sign up information is provided on this After Visit Summary.  MyChart is used to connect with patients for Virtual Visits (Telemedicine).  Patients are able to view lab/test results, encounter notes, upcoming appointments, etc.  Non-urgent messages can be sent to your provider as well.   To learn more about what you can do with MyChart, go to [email or web address].    Your next appointment:   6 week(s)  The format for your next appointment:   In Person  Provider:   You may see Nelva Bush, MD or one of the following Advanced Practice Providers on your designated Care Team:   Murray Hodgkins, NP Christell Faith, PA-C Marrianne Mood, PA-C Cadence [place], [place]

## 2021-05-24 ENCOUNTER — Encounter: Payer: Self-pay | Admitting: Internal Medicine

## 2021-05-26 ENCOUNTER — Telehealth: Payer: Self-pay | Admitting: *Deleted

## 2021-05-26 NOTE — Telephone Encounter (Signed)
Attempted to call pt. No answer. Unable to leave vm, no voicemail set up.

## 2021-05-26 NOTE — Telephone Encounter (Signed)
Spoke to pt, she does confirm that she is taking Bisoprolol '5mg'$  HALF tablet daily and Metoprolol Succinate '25mg'$  TWICE daily.  Notified pt will make Dr. Saunders Revel aware and will call back with any further recc.

## 2021-05-26 NOTE — Telephone Encounter (Signed)
-----   Message from Nelva Bush, MD sent at 05/24/2021  7:05 PM EDT ----- Denna Haggard,  Sorry to bother you.  Would you be kind enough to clarify with Ms. Kincheloe if she is taking bisoprolol, metoprolol, or both (I didn't realize both were still listed on her medication list at last weeks visit)?  Thanks.  Gerald Stabs

## 2021-05-27 NOTE — Telephone Encounter (Signed)
End, Harrell Gave, MD  Solmon Ice, RN Please have her stop taking bisoprolol and continue current dose of metoprolol.  Thanks.   Spoke to pt this morning. Notified per Dr. Saunders Revel to STOP taking Bisoprolol and to continue current dose of Metoprolol. Pt voiced understanding.   Pt receives her medications through bubble packs through her pharmacy. I spoke to "Lavella Lemons" at [facility] to confirm discontinuation of Bisoprolol.  Lavella Lemons will update bubble packs and will contact pt to ensure she removes Bisoprolol from current pack.

## 2021-05-27 NOTE — Addendum Note (Signed)
Addended by: Darlyne Russian on: 05/27/2021 09:27 AM   Modules accepted: Orders

## 2021-05-28 DIAGNOSIS — I493 Ventricular premature depolarization: Secondary | ICD-10-CM | POA: Diagnosis not present

## 2021-06-02 ENCOUNTER — Other Ambulatory Visit: Payer: Self-pay | Admitting: Internal Medicine

## 2021-06-02 ENCOUNTER — Telehealth: Payer: Self-pay | Admitting: *Deleted

## 2021-06-02 DIAGNOSIS — I5022 Chronic systolic (congestive) heart failure: Secondary | ICD-10-CM

## 2021-06-02 DIAGNOSIS — I493 Ventricular premature depolarization: Secondary | ICD-10-CM

## 2021-06-02 DIAGNOSIS — I428 Other cardiomyopathies: Secondary | ICD-10-CM

## 2021-06-02 NOTE — Telephone Encounter (Signed)
-----   Message from Nelva Bush, MD sent at 06/01/2021  3:51 PM EDT ----- Please let Lauren Banks know that her monitor showed frequent extra beats (PVCs) as we suspected based on prior EKGs.  Given her weakened heart (cardiomyopathy) I recommend that we refer her to Dr. Quentin Ore for further evaluation in case the PVCs are contributing to her cardiomyopathy.  She should continue her current medications.

## 2021-06-02 NOTE — Telephone Encounter (Signed)
Spoke with patient and reviewed results and recommendations. Advised I would place order to see Dr. Quentin Ore and scheduling will call to get that arranged for her. She was agreeable with this plan and had no further questions at this time.

## 2021-06-02 NOTE — Telephone Encounter (Signed)
Appointment scheduled with provider and patient aware and confirmed.

## 2021-06-04 ENCOUNTER — Telehealth: Payer: Self-pay

## 2021-06-04 ENCOUNTER — Encounter: Payer: Self-pay | Admitting: Cardiology

## 2021-06-04 ENCOUNTER — Ambulatory Visit (INDEPENDENT_AMBULATORY_CARE_PROVIDER_SITE_OTHER): Payer: BC Managed Care – PPO | Admitting: Cardiology

## 2021-06-04 ENCOUNTER — Other Ambulatory Visit: Payer: Self-pay

## 2021-06-04 VITALS — BP 118/78 | HR 70 | Ht 70.0 in | Wt 263.0 lb

## 2021-06-04 DIAGNOSIS — R0683 Snoring: Secondary | ICD-10-CM

## 2021-06-04 DIAGNOSIS — I428 Other cardiomyopathies: Secondary | ICD-10-CM

## 2021-06-04 DIAGNOSIS — I493 Ventricular premature depolarization: Secondary | ICD-10-CM | POA: Diagnosis not present

## 2021-06-04 DIAGNOSIS — I5022 Chronic systolic (congestive) heart failure: Secondary | ICD-10-CM | POA: Diagnosis not present

## 2021-06-04 NOTE — Telephone Encounter (Signed)
   Office Name: Edwyna Perfect         Referring Provider:  DR. Quentin Ore    Date:  06/04/2021 STOP BANG RISK ASSESSMENT S (snore) Have you been told that you snore?     YES   T (tired) Are you often tired, fatigued, or sleepy during the day?   YES  O (obstruction) Do you stop breathing, choke, or gasp during sleep? YES   P (pressure) Do you have or are you being treated for high blood pressure? YES   B (BMI) Is your body index greater than 35 kg/m? YES   A (age) Are you 44 years old or older? YES   N (neck) Do you have a neck circumference greater than 16 inches?   NO   G (gender) Are you a female? NO   TOTAL STOP/BANG "YES" ANSWERS                                                                        For Office Use Only              Procedure Order Form    YES to 3+ Stop Bang questions OR two clinical symptoms - patient qualifies for WatchPAT (CPT 95800)             Clinical Notes: Will consult Sleep Specialist and refer for management of therapy due to patient increased risk of Sleep Apnea. Ordering a sleep study due to the following two clinical symptoms: Excessive daytime sleepiness G47.10 / Gastroesophageal reflux K21.9 / Nocturia R35.1 / Morning Headaches G44.221 / Difficulty concentrating R41.840 / Memory problems or poor judgment G31.84 / Personality changes or irritability R45.4 / Loud snoring R06.83 / Depression F32.9 / Unrefreshed by sleep G47.8 / Impotence N52.9 / History of high blood pressure R03.0 / Insomnia G47.00

## 2021-06-04 NOTE — Patient Instructions (Signed)
Medication Instructions:  Your physician recommends that you continue on your current medications as directed. Please refer to the Current Medication list given to you today. *If you need a refill on your cardiac medications before your next appointment, please call your pharmacy*  Lab Work: None ordered. If you have labs (blood work) drawn today and your tests are completely normal, you will receive your results only by: [place] (if you have MyChart) OR A paper copy in the mail If you have any lab test that is abnormal or we need to change your treatment, we will call you to review the results.  Testing/Procedures: Your physician has requested that you have a cardiac MRI. Cardiac MRI uses a computer to create images of your heart as its beating, producing both still and moving pictures of your heart and major blood vessels.   Your physician has recommended that you have a sleep study. This test records several body functions during sleep, including: brain activity, eye movement, oxygen and carbon dioxide blood levels, heart rate and rhythm, breathing rate and rhythm, the flow of air through your mouth and nose, snoring, body muscle movements, and chest and belly movement.  Follow-Up: At [facility], you and your health needs are our priority.  As part of our continuing mission to provide you with exceptional heart care, we have created designated Provider Care Teams.  These Care Teams include your primary Cardiologist (physician) and Advanced Practice Providers (APPs -  Physician Assistants and Nurse Practitioners) who all work together to provide you with the care you need, when you need it.  Your next appointment:   Your physician wants you to follow-up in: 6 weeks with Vickie Epley, MD    You are scheduled for Cardiac MRI on ______________. Please arrive at the [facility] main entrance of [facility] at ________________ (30-45 minutes prior to test start time).  ?  [facility] [address] [place], [place] [address] [phone]  Please take advantage of the free valet parking available at the MAIN entrance (A entrance). Proceed to the [facility] Radiology Department (First Floor). ? Magnetic resonance imaging (MRI) is a painless test that produces images of the inside of the body without using Xrays.  During an MRI, strong magnets and radio waves work together in a [employer] officer, political party to form detailed images.   MRI images may provide more details about a medical condition than X-rays, CT scans, and ultrasounds can provide.  You may be given earphones to listen for instructions.  You may eat a light breakfast and take medications as ordered with the exception of HCTZ (fluid pill, other). Please avoid stimulants for 12 hr prior to test. (Ie. Caffeine, nicotine, chocolate, or antihistamine medications)  If a contrast material will be used, an IV will be inserted into one of your veins. Contrast material will be injected into your IV. It will leave your body through your urine within a day. You may be told to drink plenty of fluids to help flush the contrast material out of your system.  You will be asked to remove all metal, including: Watch, jewelry, and other metal objects including hearing aids, hair pieces and dentures. Also wearable glucose monitoring systems (ie. Freestyle Libre and Omnipods) (Braces and fillings normally are not a problem.)   TEST WILL TAKE APPROXIMATELY 1 HOUR  PLEASE NOTIFY SCHEDULING AT LEAST 24 HOURS IN ADVANCE IF YOU ARE UNABLE TO KEEP YOUR APPOINTMENT. [phone]  Please call Marchia Bond,  cardiac imaging nurse navigator with any questions/concerns. Marchia Bond RN Navigator Cardiac Imaging Gordy Clement RN Navigator Cardiac Imaging [facility] [phone] Office

## 2021-06-04 NOTE — Progress Notes (Signed)
Electrophysiology Office Note:    Date:  06/04/2021   ID:  Lauren Banks, DOB 1966-04-14, MRN [id number]  PCP:  Renee Rival, NP  [facility] Cardiologist:  Nelva Bush, MD  [facility] Electrophysiologist:  None   Referring MD: Renee Rival, NP   Chief Complaint: Nonischemic cardiomyopathy2  History of Present Illness:    Lauren Banks is a 55 y.o. female who presents for an evaluation of nonischemic cardiomyopathy at the request of Dr. Saunders Revel. Their medical history includes hypertension, hyperlipidemia, frequent PVCs, tobacco abuse.  The patient last saw Dr. Saunders Revel May 22, 2021.  At that appointment NYHA class II symptoms were noted.  A 3-day ZIO monitor was ordered to assess the PVC burden given her history of a decreased ejection fraction.  The patient tells me that she cannot tell when she is having PVCs.  The patient is a heavy snorer.  Her husband has told her that she stops breathing throughout the night.  She feels tired all the time.  Past Medical History:  Diagnosis Date   Chronic combined systolic (congestive) and diastolic (congestive) heart failure (HCC)    Frequent PVCs    Hyperlipemia    Hypertension    Nonischemic cardiomyopathy [facility])     Past Surgical History:  Procedure Laterality Date   ABDOMINAL HYSTERECTOMY  10/13/2011   Procedure: HYSTERECTOMY ABDOMINAL;  Surgeon: Jonnie Kind, MD;  Location: [facility] ORS;  Service: Gynecology;  Laterality: N/A;   CARDIAC CATHETERIZATION     RIGHT/LEFT HEART CATH AND CORONARY ANGIOGRAPHY N/A 11/19/2020   Procedure: RIGHT/LEFT HEART CATH AND CORONARY ANGIOGRAPHY;  Surgeon: Nelva Bush, MD;  Location: [place] CV LAB;  Service: Cardiovascular;  Laterality: N/A;    Current Medications: Current Meds  Medication Sig   albuterol (VENTOLIN HFA) 108 (90 Base) MCG/ACT inhaler Inhale 2 puffs into the lungs every 4 (four) hours as needed for wheezing or shortness of breath.   atorvastatin (LIPITOR) 40 MG  tablet Take 1 tablet (40 mg total) by mouth daily.   buPROPion (WELLBUTRIN XL) 300 MG 24 hr tablet Take 300 mg by mouth daily.   famotidine (PEPCID) 20 MG tablet One after supper   fluticasone (FLONASE) 50 MCG/ACT nasal spray Place 1 spray into both nostrils daily as needed for allergies or rhinitis.   furosemide (LASIX) 40 MG tablet TAKE 1 TABLET BY MOUTH TWICE DAILY   hydrALAZINE (APRESOLINE) 25 MG tablet Take 1 tablet (25 mg total) by mouth 3 (three) times daily.   ipratropium-albuterol (DUONEB) 0.5-2.5 (3) MG/3ML SOLN Inhale 3 mLs into the lungs every 4 (four) hours as needed (Asthma).   isosorbide dinitrate (ISORDIL) 20 MG tablet Take 0.5 tablets (10 mg total) by mouth 3 (three) times daily.   magnesium oxide (MAG-OX) 400 (241.3 Mg) MG tablet TAKE 1 TABLET BY MOUTH ONCE DAILY.   metoprolol succinate (TOPROL-XL) 25 MG 24 hr tablet TAKE 1 TABLET BY MOUTH IN THE MORNING AND AT BEDTIME   pantoprazole (PROTONIX) 40 MG tablet Take 1 tablet (40 mg total) by mouth daily. Take 30-60 min before first meal of the day   potassium chloride SA (KLOR-CON) 20 MEQ tablet TAKE 1 TABLET BY MOUTH ONCE DAILY.   sacubitril-valsartan (ENTRESTO) 49-51 MG Take 1 tablet by mouth 2 (two) times daily.   spironolactone (ALDACTONE) 25 MG tablet Take 1 tablet (25 mg total) by mouth daily.   venlafaxine XR (EFFEXOR-XR) 37.5 MG 24 hr capsule Take 37.5 mg by mouth daily.     Allergies:  Penicillins   Social History   Socioeconomic History   Marital status: Married    Spouse name: Not on file   Number of children: Not on file   Years of education: Not on file   Highest education level: Not on file  Occupational History   Not on file  Tobacco Use   Smoking status: Every Day    Packs/day: 0.25    Years: 30.00    Pack years: 7.50    Types: Cigarettes   Smokeless tobacco: Never  Vaping Use   Vaping Use: Former  Substance and Sexual Activity   Alcohol use: No   Drug use: Yes    Frequency: 7.0 times per week     Types: Marijuana   Sexual activity: Yes    Birth control/protection: None, Surgical  Other Topics Concern   Not on file  Social History Narrative   Not on file   Social Determinants of Health   Financial Resource Strain: Not on file  Food Insecurity: Not on file  Transportation Needs: Not on file  Physical Activity: Not on file  Stress: Not on file  Social Connections: Not on file     Family History: The patient's family history includes Alzheimer's disease in her mother; Cancer in her brother, maternal aunt, and mother; Diabetes in her father and sister; Gout in her brother and father; Heart disease in her sister; Hypertension in her brother, father, and mother; Kidney disease in her father and maternal grandmother; Obesity in her sister; Stroke in her maternal grandmother and paternal grandmother. There is no history of Anesthesia problems.  ROS:   Please see the history of present illness.    All other systems reviewed and are negative.  EKGs/Labs/Other Studies Reviewed:    The following studies were reviewed today:  June 01, 2021 ZIO monitor personally reviewed 12.2% PVC burden PVCs are predominantly in the nighttime hours  05/22/2021 ECG Monomorphic frequent PVCs   February 05, 2021 echo personally reviewed Left ventricular function mildly reduced, 45% Right ventricular function low normal Trivial MR    EKG:  The ekg ordered today demonstrates sinus rhythm.  Right bundle branch block.  Recent Labs: 09/10/2020: TSH 1.726 10/30/2020: ALT 18; Magnesium 1.6 11/19/2020: Hemoglobin 13.5; Platelets 321 11/20/2020: B Natriuretic Peptide 730.5 12/12/2020: BUN 9; Creatinine, Ser 1.00; Potassium 4.0; Sodium 137  Recent Lipid Panel    Component Value Date/Time   CHOL 231 (H) 10/30/2020 1439   TRIG 262 (H) 10/30/2020 1439   HDL 29 (L) 10/30/2020 1439   CHOLHDL 8.0 (H) 10/30/2020 1439   LDLCALC 153 (H) 10/30/2020 1439   LDLDIRECT 164 (H) 10/30/2020 1439    Physical  Exam:    VS:  BP 118/78 (BP Location: Left Arm, Patient Position: Sitting, Cuff Size: Large)   Pulse 70   Ht '5\' 10"'$  (1.778 m)   Wt 263 lb (119.3 kg)   LMP 09/21/2011 Comment: partial  SpO2 96%   BMI 37.74 kg/m     Wt Readings from Last 3 Encounters:  06/04/21 263 lb (119.3 kg)  05/22/21 261 lb (118.4 kg)  01/17/21 246 lb (111.6 kg)     GEN:  Well nourished, well developed in no acute distress.  Obese HEENT: Normal NECK: No JVD; No carotid bruits LYMPHATICS: No lymphadenopathy CARDIAC: RRR, no murmurs, rubs, gallops RESPIRATORY:  Clear to auscultation without rales, wheezing or rhonchi  ABDOMEN: Soft, non-tender, non-distended MUSCULOSKELETAL:  No edema; No deformity  SKIN: Warm and dry NEUROLOGIC:  Alert and oriented  x 3 PSYCHIATRIC:  Normal affect   ASSESSMENT:    1. NICM (nonischemic cardiomyopathy) ([place])   2. Chronic HFrEF (heart failure with reduced ejection fraction) ([place])   3. PVC's (premature ventricular contractions)    PLAN:    In order of problems listed above:   1. NICM (nonischemic cardiomyopathy) ([place]) 2. Chronic HFrEF (heart failure with reduced ejection fraction) ([place]) 3. PVC's (premature ventricular contractions)  Patient has nonischemic cardiomyopathy.  Her EKG is abnormal with a right bundle branch block.  Review of her PVCs and heart monitor tracings show that she predominately has nocturnal PVCs that correspond to her severe sleep disordered breathing.  She stops breathing frequently at night, snores heavily and has poor quality sleep.  She needs a sleep study ASAP.  I am concerned that if she has severe untreated sleep apnea, this could be contributing to elevated pulmonary pressures.  Her RV is down on her most recent echocardiogram.  I do not think the PVC is the cause of her cardiomyopathy.  I will get an MRI to assess for any LGE and for a better assessment of the right heart.  I will plan to see her back in follow-up in 6 to 8 weeks after  the cardiac MRI and sleep study.   Medication Adjustments/Labs and Tests Ordered: Current medicines are reviewed at length with the patient today.  Concerns regarding medicines are outlined above.  No orders of the defined types were placed in this encounter.  No orders of the defined types were placed in this encounter.    Signed, Hilton Cork. Quentin Ore, MD, [facility], [facility] 06/04/2021 3:15 PM    Electrophysiology [facility]

## 2021-06-10 ENCOUNTER — Telehealth: Payer: Self-pay | Admitting: *Deleted

## 2021-06-10 ENCOUNTER — Other Ambulatory Visit: Payer: Self-pay | Admitting: Cardiology

## 2021-06-10 DIAGNOSIS — I5022 Chronic systolic (congestive) heart failure: Secondary | ICD-10-CM

## 2021-06-10 DIAGNOSIS — R0683 Snoring: Secondary | ICD-10-CM

## 2021-06-10 DIAGNOSIS — R29818 Other symptoms and signs involving the nervous system: Secondary | ICD-10-CM

## 2021-06-10 DIAGNOSIS — I5023 Acute on chronic systolic (congestive) heart failure: Secondary | ICD-10-CM

## 2021-06-10 DIAGNOSIS — I10 Essential (primary) hypertension: Secondary | ICD-10-CM

## 2021-06-10 NOTE — Telephone Encounter (Signed)
Patient notified of itamar denial. In lab recommended due to her CHF. Also per Dr Quentin Ore he requests for sleep study to be done ASAP. Patient agreed with plan. Split night sleep study appointment has been scheduled for tomorrow night. Patient was instructed to return the itamar sleep study to the [employer]. She said that she would.

## 2021-06-10 NOTE — Telephone Encounter (Signed)
-----   Message from Damian Leavell, RN sent at 06/04/2021  4:01 PM EDT ----- Regarding: home sleep study Please review for home sleep study.  Her stopbang results are located in her chart under telephone message.  Thank you Sonia Baller

## 2021-06-10 NOTE — Telephone Encounter (Signed)
Prior Authorization for [employer] sent to [facility] via web portal. Due to patient's history of CHF it was denied. In lab split night study was  recommended and approved. Per Dr Quentin Ore ok to switch to in lab study.

## 2021-06-11 ENCOUNTER — Other Ambulatory Visit: Payer: Self-pay

## 2021-06-11 ENCOUNTER — Ambulatory Visit (HOSPITAL_BASED_OUTPATIENT_CLINIC_OR_DEPARTMENT_OTHER): Payer: BC Managed Care – PPO | Attending: Cardiology | Admitting: Cardiovascular Disease

## 2021-06-11 DIAGNOSIS — R29818 Other symptoms and signs involving the nervous system: Secondary | ICD-10-CM | POA: Diagnosis not present

## 2021-06-11 DIAGNOSIS — I110 Hypertensive heart disease with heart failure: Secondary | ICD-10-CM | POA: Diagnosis not present

## 2021-06-11 DIAGNOSIS — I5023 Acute on chronic systolic (congestive) heart failure: Secondary | ICD-10-CM | POA: Diagnosis not present

## 2021-06-11 DIAGNOSIS — G4730 Sleep apnea, unspecified: Secondary | ICD-10-CM | POA: Insufficient documentation

## 2021-06-11 DIAGNOSIS — R0683 Snoring: Secondary | ICD-10-CM

## 2021-06-11 DIAGNOSIS — I5022 Chronic systolic (congestive) heart failure: Secondary | ICD-10-CM

## 2021-06-11 DIAGNOSIS — I10 Essential (primary) hypertension: Secondary | ICD-10-CM | POA: Diagnosis not present

## 2021-06-11 DIAGNOSIS — G4736 Sleep related hypoventilation in conditions classified elsewhere: Secondary | ICD-10-CM | POA: Insufficient documentation

## 2021-06-19 ENCOUNTER — Other Ambulatory Visit: Payer: Self-pay | Admitting: Internal Medicine

## 2021-06-19 ENCOUNTER — Other Ambulatory Visit: Payer: Self-pay | Admitting: Nurse Practitioner

## 2021-06-21 ENCOUNTER — Encounter (HOSPITAL_BASED_OUTPATIENT_CLINIC_OR_DEPARTMENT_OTHER): Payer: Self-pay | Admitting: Cardiovascular Disease

## 2021-06-21 NOTE — Procedures (Signed)
Patient Name: Lauren Banks, Lauren Banks Date: 06/11/2021 Gender: Female D.O.B: 1966/03/02 Age (years): 54 Referring Provider: Vickie Epley Height (inches): 63 Interpreting Physician: Shelva Majestic MD, ABSM Weight (lbs): 260 RPSGT: Zadie Rhine BMI: 37 MRN: [id number] Neck Size: 17.00  CLINICAL INFORMATION Sleep Study Type: NPSG  Indication for sleep study: Snoring  Epworth Sleepiness Score: 3  SLEEP STUDY TECHNIQUE As per the AASM Manual for the Scoring of Sleep and Associated Events v2.3 (April 2016) with a hypopnea requiring 4% desaturations.  The channels recorded and monitored were frontal, central and occipital EEG, electrooculogram (EOG), submentalis EMG (chin), nasal and oral airflow, thoracic and abdominal wall motion, anterior tibialis EMG, snore microphone, electrocardiogram, and pulse oximetry.  MEDICATIONS albuterol (VENTOLIN HFA) 108 (90 Base) MCG/ACT inhaler atorvastatin (LIPITOR) 40 MG tablet buPROPion (WELLBUTRIN XL) 300 MG 24 hr tablet ENTRESTO 49-51 MG escitalopram (LEXAPRO) 20 MG tablet famotidine (PEPCID) 20 MG tablet fluticasone (FLONASE) 50 MCG/ACT nasal spray furosemide (LASIX) 40 MG tablet hydrALAZINE (APRESOLINE) 25 MG tablet ipratropium-albuterol (DUONEB) 0.5-2.5 (3) MG/3ML SOLN isosorbide dinitrate (ISORDIL) 20 MG tablet magnesium oxide (MAG-OX) 400 (241.3 Mg) MG tablet metoprolol succinate (TOPROL-XL) 25 MG 24 hr tablet pantoprazole (PROTONIX) 40 MG tablet potassium chloride SA (KLOR-CON) 20 MEQ tablet spironolactone (ALDACTONE) 25 MG tablet venlafaxine XR (EFFEXOR-XR) 37.5 MG 24 hr capsule  Medications self-administered by patient taken the night of the study : entresto, toprol-xl, ISORDIL, LASIX, APRESOLINE  SLEEP ARCHITECTURE The study was initiated at 10:05:02 PM and ended at 4:01:30 AM.  Sleep onset time was 26.0 minutes and the sleep efficiency was 76.3%%. The total sleep time was 272 minutes.  Stage REM latency was 280.5  minutes.  The patient spent 10.1%% of the night in stage N1 sleep, 75.9%% in stage N2 sleep, 0.0%% in stage N3 and 14% in REM.  Alpha intrusion was absent.  Supine sleep was 24.08%.  RESPIRATORY PARAMETERS The overall apnea/hypopnea index (AHI) was 2.6 per hour. The respiratory disturbance index (RDI) was 13.7/h. There were 0 total apneas, including 0 obstructive, 0 central and 0 mixed apneas. There were 12 hypopneas and 50 RERAs.  The AHI during Stage REM sleep was 14.2 per hour.  AHI while supine was 0.0 per hour.  The mean oxygen saturation was 92.4%. The minimum SpO2 during sleep was 83.0%.  Loud snoring was noted during this study.  CARDIAC DATA The 2 lead EKG demonstrated sinus rhythm. The mean heart rate was 74.0 beats per minute. Other EKG findings include: None.  LEG MOVEMENT DATA The total PLMS were 0 with a resulting PLMS index of 0.0. Associated arousal with leg movement index was 3.3 .  IMPRESSIONS - Increased upper airway resistance (UARS) without definiutive sleep apnea overall (AHI 2.6/h, with increaased RDI 13.7/h); however, mild sleep apnea was present during REM sleep (AHI 14.2/h).  - Mild oxygen desaturation to a nadir of 83%. - The patient snored with loud snoring volume. - No cardiac abnormalities were noted during this study. - Clinically significant periodic limb movements did not occur during sleep. No significant associated arousals.  DIAGNOSIS - Sleep Apnea, unspecified (G47.30) - UARS - Nocturnal Hypoxemia (G47.36)  RECOMMENDATIONS - At present, patient does not meet criteria for CPAP. - Effort should be made to optimize nasal and oropharyngeal patency.  - Consider ENT evaluation or alternatives for continuous loud snoring. - Avoid alcohol, sedatives and other CNS depressants that may worsen sleep apnea and disrupt normal sleep architecture. - Sleep hygiene should be reviewed to assess factors that  may improve sleep quality. - Weight management  (BMI 37) and regular exercise should be initiated or continued if appropriate.  [Electronically signed] 06/21/2021 09:06 AM  Shelva Majestic MD, [facility], [place], American Board of Sleep Medicine   NPI: [id number]  [place] PH: [phone]   FX: [phone] [place]

## 2021-06-25 ENCOUNTER — Telehealth: Payer: Self-pay | Admitting: *Deleted

## 2021-06-25 NOTE — Telephone Encounter (Signed)
Patient notified of sleep study results and recommendations. She voiced verbal understanding and had no questions.

## 2021-06-25 NOTE — Telephone Encounter (Signed)
-----   Message from Troy Sine, MD sent at 06/21/2021  9:12 AM EDT ----- Mariann Laster, please notify pt of results

## 2021-06-26 ENCOUNTER — Other Ambulatory Visit: Payer: Self-pay | Admitting: Internal Medicine

## 2021-06-26 DIAGNOSIS — R058 Other specified cough: Secondary | ICD-10-CM

## 2021-07-04 ENCOUNTER — Ambulatory Visit: Payer: BC Managed Care – PPO | Admitting: Internal Medicine

## 2021-07-09 ENCOUNTER — Other Ambulatory Visit (HOSPITAL_COMMUNITY): Payer: Self-pay | Admitting: *Deleted

## 2021-07-09 ENCOUNTER — Telehealth (HOSPITAL_COMMUNITY): Payer: Self-pay | Admitting: *Deleted

## 2021-07-09 DIAGNOSIS — Z01812 Encounter for preprocedural laboratory examination: Secondary | ICD-10-CM

## 2021-07-09 NOTE — Telephone Encounter (Signed)
Reaching out to patient to offer assistance regarding upcoming cardiac imaging study; pt verbalizes understanding of appt date/time, parking situation and where to check in; name and call back number provided for further questions should they arise  Gordy Clement RN Navigator Cardiac [place] [facility] [phone] office [phone] cell  Denies claustrophobia or having metal in her body.  Orders placed for CBC and patient will go to [facility] or [facility] to have lab drawn.

## 2021-07-10 ENCOUNTER — Other Ambulatory Visit: Payer: Self-pay

## 2021-07-10 ENCOUNTER — Other Ambulatory Visit (HOSPITAL_COMMUNITY)
Admission: RE | Admit: 2021-07-10 | Discharge: 2021-07-10 | Disposition: A | Discharge status: Home / Self Care | Payer: BC Managed Care – PPO | Source: Ambulatory Visit | Attending: Cardiology | Admitting: Cardiology

## 2021-07-10 DIAGNOSIS — Z01812 Encounter for preprocedural laboratory examination: Secondary | ICD-10-CM | POA: Diagnosis not present

## 2021-07-10 LAB — CBC
HCT: 42.9 % (ref 36.0–46.0)
Hemoglobin: 13.8 g/dL (ref 12.0–15.0)
MCH: 27.8 pg (ref 26.0–34.0)
MCHC: 32.2 g/dL (ref 30.0–36.0)
MCV: 86.5 fL (ref 80.0–100.0)
Platelets: 339 10*3/uL (ref 150–400)
RBC: 4.96 MIL/uL (ref 3.87–5.11)
RDW: 14.1 % (ref 11.5–15.5)
WBC: 8.2 10*3/uL (ref 4.0–10.5)
nRBC: 0 % (ref 0.0–0.2)

## 2021-07-14 ENCOUNTER — Ambulatory Visit (HOSPITAL_COMMUNITY)
Admission: RE | Admit: 2021-07-14 | Discharge: 2021-07-14 | Disposition: A | Discharge status: Home / Self Care | Payer: BC Managed Care – PPO | Source: Ambulatory Visit | Attending: Cardiology | Admitting: Cardiology

## 2021-07-14 ENCOUNTER — Other Ambulatory Visit: Payer: Self-pay

## 2021-07-14 DIAGNOSIS — I5022 Chronic systolic (congestive) heart failure: Secondary | ICD-10-CM | POA: Insufficient documentation

## 2021-07-14 DIAGNOSIS — I428 Other cardiomyopathies: Secondary | ICD-10-CM | POA: Insufficient documentation

## 2021-07-14 MED ORDER — GADOBUTROL 1 MMOL/ML IV SOLN
10.0000 mL | Freq: Once | INTRAVENOUS | Status: AC | PRN
  Administered 2021-07-14: 10 mL via INTRAVENOUS

## 2021-07-16 ENCOUNTER — Encounter: Payer: Self-pay | Admitting: Cardiology

## 2021-07-16 ENCOUNTER — Ambulatory Visit (INDEPENDENT_AMBULATORY_CARE_PROVIDER_SITE_OTHER): Payer: BC Managed Care – PPO | Admitting: Cardiology

## 2021-07-16 ENCOUNTER — Other Ambulatory Visit: Payer: Self-pay

## 2021-07-16 VITALS — BP 110/80 | HR 71 | Ht 70.0 in | Wt 263.0 lb

## 2021-07-16 DIAGNOSIS — I5022 Chronic systolic (congestive) heart failure: Secondary | ICD-10-CM | POA: Diagnosis not present

## 2021-07-16 DIAGNOSIS — I10 Essential (primary) hypertension: Secondary | ICD-10-CM

## 2021-07-16 DIAGNOSIS — R0683 Snoring: Secondary | ICD-10-CM

## 2021-07-16 DIAGNOSIS — I493 Ventricular premature depolarization: Secondary | ICD-10-CM

## 2021-07-16 NOTE — Progress Notes (Signed)
Electrophysiology Office Follow up Visit Note:    Date:  07/16/2021   ID:  Lauren Banks, DOB May 23, 1966, MRN [id number]  PCP:  Renee Rival, NP  [facility] Cardiologist:  Nelva Bush, MD  [facility] Electrophysiologist:  Vickie Epley, MD    Interval History:    Lauren Banks is a 55 y.o. female who presents for a follow up visit. They were last seen in clinic 06/04/2021 for her NICM. Since I last saw her, a cardiac MRI was performed. She also had a sleep study which was not suggestive of sleep apnea.  She tells me she has done well since I last saw her.  She did stop her atorvastatin because it was causing some muscle discomfort.  The muscle discomfort has improved since stopping the medication.    Past Medical History:  Diagnosis Date   Chronic combined systolic (congestive) and diastolic (congestive) heart failure (HCC)    Frequent PVCs    Hyperlipemia    Hypertension    Nonischemic cardiomyopathy [facility])     Past Surgical History:  Procedure Laterality Date   ABDOMINAL HYSTERECTOMY  10/13/2011   Procedure: HYSTERECTOMY ABDOMINAL;  Surgeon: Jonnie Kind, MD;  Location: [facility];  Service: Gynecology;  Laterality: N/A;   CARDIAC CATHETERIZATION     RIGHT/LEFT HEART CATH AND CORONARY ANGIOGRAPHY N/A 11/19/2020   Procedure: RIGHT/LEFT HEART CATH AND CORONARY ANGIOGRAPHY;  Surgeon: Nelva Bush, MD;  Location: [place] [facility];  Service: Cardiovascular;  Laterality: N/A;    Current Medications: Current Meds  Medication Sig   albuterol (VENTOLIN HFA) 108 (90 Base) MCG/ACT inhaler Inhale 2 puffs into the lungs every 4 (four) hours as needed for wheezing or shortness of breath.   buPROPion (WELLBUTRIN XL) 300 MG 24 hr tablet Take 300 mg by mouth daily.   ENTRESTO 49-51 MG TAKE 1 TABLET BY MOUTH TWICE DAILY   famotidine (PEPCID) 20 MG tablet TAKE 1 TABLET BY MOUTH ONCE DAILY.   fluticasone (FLONASE) 50 MCG/ACT nasal spray Place 1 spray into both  nostrils daily as needed for allergies or rhinitis.   furosemide (LASIX) 40 MG tablet TAKE 1 TABLET BY MOUTH TWICE DAILY   hydrALAZINE (APRESOLINE) 25 MG tablet Take 1 tablet (25 mg total) by mouth 3 (three) times daily.   ipratropium-albuterol (DUONEB) 0.5-2.5 (3) MG/3ML SOLN Inhale 3 mLs into the lungs every 4 (four) hours as needed (Asthma).   isosorbide dinitrate (ISORDIL) 20 MG tablet Take 0.5 tablets (10 mg total) by mouth 3 (three) times daily.   magnesium oxide (MAG-OX) 400 (241.3 Mg) MG tablet TAKE 1 TABLET BY MOUTH ONCE DAILY.   metoprolol succinate (TOPROL-XL) 25 MG 24 hr tablet TAKE 1 TABLET BY MOUTH IN THE MORNING AND AT BEDTIME   pantoprazole (PROTONIX) 40 MG tablet Take 1 tablet (40 mg total) by mouth daily. Take 30-60 min before first meal of the day   potassium chloride SA (KLOR-CON) 20 MEQ tablet TAKE 1 TABLET BY MOUTH ONCE DAILY.   spironolactone (ALDACTONE) 25 MG tablet Take 1 tablet (25 mg total) by mouth daily.   venlafaxine XR (EFFEXOR-XR) 37.5 MG 24 hr capsule Take 37.5 mg by mouth daily.     Allergies:   Penicillins   Social History   Socioeconomic History   Marital status: Married    Spouse name: Not on file   Number of children: Not on file   Years of education: Not on file   Highest education level: Not on file  Occupational History  Not on file  Tobacco Use   Smoking status: Every Day    Packs/day: 0.25    Years: 30.00    Pack years: 7.50    Types: Cigarettes   Smokeless tobacco: Never  Vaping Use   Vaping Use: Former  Substance and Sexual Activity   Alcohol use: No   Drug use: Yes    Frequency: 7.0 times per week    Types: Marijuana   Sexual activity: Yes    Birth control/protection: None, Surgical  Other Topics Concern   Not on file  Social History Narrative   Not on file   Social Determinants of Health   Financial Resource Strain: Not on file  Food Insecurity: Not on file  Transportation Needs: Not on file  Physical Activity: Not on  file  Stress: Not on file  Social Connections: Not on file     Family History: The patient's family history includes Alzheimer's disease in her mother; Cancer in her brother, maternal aunt, and mother; Diabetes in her father and sister; Gout in her brother and father; Heart disease in her sister; Hypertension in her brother, father, and mother; Kidney disease in her father and maternal grandmother; Obesity in her sister; Stroke in her maternal grandmother and paternal grandmother. There is no history of Anesthesia problems.  ROS:   Please see the history of present illness.    All other systems reviewed and are negative.  EKGs/Labs/Other Studies Reviewed:    The following studies were reviewed today:   07/14/2021 cMR 1. Normal left ventricular size, thickness and systolic function (LVEF = 53%). There are no regional wall motion abnormalities.  There is no late gadolinium enhancement in the left ventricular myocardium.  LVEDV: 141 ml  LVESV: 65 ml  SV: 76 ml  CO: 5.2 L/min  Myocardial mass: 136g 2. Normal right ventricular size, thickness. Mildly reduced systolic function (RVEF = 42%). There are no regional wall motion abnormalities. 3.  Normal left and right atrial size. 4. Normal size of the aortic root, ascending aorta and pulmonary artery. 5. Mild aortic regurgitation. no other significant valvular abnormalities. 6.  Normal pericardium.  No pericardial effusion.   Today's EKG shows sinus rhythm with 1 PVC.  There is a right bundle branch block.  Recent Labs: 09/10/2020: TSH 1.726 10/30/2020: ALT 18; Magnesium 1.6 11/20/2020: B Natriuretic Peptide 730.5 12/12/2020: BUN 9; Creatinine, Ser 1.00; Potassium 4.0; Sodium 137 07/10/2021: Hemoglobin 13.8; Platelets 339  Recent Lipid Panel    Component Value Date/Time   CHOL 231 (H) 10/30/2020 1439   TRIG 262 (H) 10/30/2020 1439   HDL 29 (L) 10/30/2020 1439   CHOLHDL 8.0 (H) 10/30/2020 1439   LDLCALC 153 (H) 10/30/2020 1439    LDLDIRECT 164 (H) 10/30/2020 1439    Physical Exam:    VS:  BP 110/80 (BP Location: Left Arm, Patient Position: Sitting, Cuff Size: Large)   Pulse 71   Ht '5\' 10"'$  (1.778 m)   Wt 263 lb (119.3 kg)   LMP 09/21/2011 Comment: partial  SpO2 98%   BMI 37.74 kg/m     Wt Readings from Last 3 Encounters:  07/16/21 263 lb (119.3 kg)  06/11/21 260 lb (117.9 kg)  06/04/21 263 lb (119.3 kg)     GEN:  Well nourished, well developed in no acute distress HEENT: Normal NECK: No JVD; No carotid bruits LYMPHATICS: No lymphadenopathy CARDIAC: RRR, no murmurs, rubs, gallops RESPIRATORY:  Clear to auscultation without rales, wheezing or rhonchi  ABDOMEN: Soft, non-tender, non-distended MUSCULOSKELETAL:  No edema; No deformity  SKIN: Warm and dry NEUROLOGIC:  Alert and oriented x 3 PSYCHIATRIC:  Normal affect   ASSESSMENT:    1. Chronic HFrEF (heart failure with reduced ejection fraction) ([place])   2. Frequent PVCs   3. Snoring   4. Primary hypertension    PLAN:    In order of problems listed above:   1. Chronic HFrEF (heart failure with reduced ejection fraction) (HCC) MRI shows an ejection fraction greater than 50% in the LV.  No LGE.  This is great news. NYHA class I-II today. Continue her good medical therapy.  Close follow-up with Dr. Saunders Revel recommended.  2. Frequent PVCs It appears that these have improved as well on good medical therapy.  Given her normal ejection fraction and asymptomatic nature of the PVCs, recommend conservative management for now.  Continue the beta-blocker.  I will see her as needed.  3. Snoring Heavy snoring but no sleep apnea detected on recent sleep study.  4. Primary hypertension Controlled today.  Continue current regimen.     Medication Adjustments/Labs and Tests Ordered: Current medicines are reviewed at length with the patient today.  Concerns regarding medicines are outlined above.  Orders Placed This Encounter  Procedures   EKG 12-Lead     No orders of the defined types were placed in this encounter.    Signed, Lars Mage, MD, [facility], [facility] 07/16/2021 8:24 AM    Electrophysiology [facility]

## 2021-07-16 NOTE — Patient Instructions (Signed)
Medication Instructions:  Your physician recommends that you continue on your current medications as directed. Please refer to the Current Medication list given to you today.  *If you need a refill on your cardiac medications before your next appointment, please call your pharmacy*   Lab Work: None ordered.  If you have labs (blood work) drawn today and your tests are completely normal, you will receive your results only by: [place] (if you have MyChart) OR A paper copy in the mail If you have any lab test that is abnormal or we need to change your treatment, we will call you to review the results.   Testing/Procedures: None ordered.    Follow-Up: At [facility], you and your health needs are our priority.  As part of our continuing mission to provide you with exceptional heart care, we have created designated Provider Care Teams.  These Care Teams include your primary Cardiologist (physician) and Advanced Practice Providers (APPs -  Physician Assistants and Nurse Practitioners) who all work together to provide you with the care you need, when you need it.  We recommend signing up for the patient portal called "MyChart".  Sign up information is provided on this After Visit Summary.  MyChart is used to connect with patients for Virtual Visits (Telemedicine).  Patients are able to view lab/test results, encounter notes, upcoming appointments, etc.  Non-urgent messages can be sent to your provider as well.   To learn more about what you can do with MyChart, go to [email or web address].    Your next appointment:   Follow up as needed with Dr Quentin Ore

## 2021-07-23 ENCOUNTER — Other Ambulatory Visit: Payer: Self-pay | Admitting: Internal Medicine

## 2021-07-25 ENCOUNTER — Other Ambulatory Visit: Payer: Self-pay | Admitting: Internal Medicine

## 2021-07-25 MED ORDER — ISOSORBIDE DINITRATE 20 MG PO TABS: 10.0000 mg | ORAL_TABLET | Freq: Three times a day (TID) | ORAL | 3 refills | Status: DC

## 2021-08-10 ENCOUNTER — Encounter (HOSPITAL_BASED_OUTPATIENT_CLINIC_OR_DEPARTMENT_OTHER): Payer: BC Managed Care – PPO | Admitting: Cardiovascular Disease

## 2021-08-21 ENCOUNTER — Other Ambulatory Visit: Payer: Self-pay | Admitting: Internal Medicine

## 2021-09-17 ENCOUNTER — Other Ambulatory Visit: Payer: Self-pay | Admitting: Internal Medicine

## 2021-09-17 ENCOUNTER — Other Ambulatory Visit: Payer: Self-pay | Admitting: Physician Assistant

## 2021-09-24 DIAGNOSIS — R7303 Prediabetes: Secondary | ICD-10-CM | POA: Diagnosis not present

## 2021-09-24 DIAGNOSIS — E559 Vitamin D deficiency, unspecified: Secondary | ICD-10-CM | POA: Diagnosis not present

## 2021-09-24 DIAGNOSIS — E782 Mixed hyperlipidemia: Secondary | ICD-10-CM | POA: Diagnosis not present

## 2021-09-24 DIAGNOSIS — Z79899 Other long term (current) drug therapy: Secondary | ICD-10-CM | POA: Diagnosis not present

## 2021-10-14 ENCOUNTER — Other Ambulatory Visit: Payer: Self-pay | Admitting: Internal Medicine

## 2021-10-14 DIAGNOSIS — F419 Anxiety disorder, unspecified: Secondary | ICD-10-CM | POA: Diagnosis not present

## 2021-10-14 DIAGNOSIS — D72829 Elevated white blood cell count, unspecified: Secondary | ICD-10-CM | POA: Diagnosis not present

## 2021-10-14 DIAGNOSIS — E782 Mixed hyperlipidemia: Secondary | ICD-10-CM | POA: Diagnosis not present

## 2021-10-14 DIAGNOSIS — R609 Edema, unspecified: Secondary | ICD-10-CM | POA: Diagnosis not present

## 2021-11-11 ENCOUNTER — Other Ambulatory Visit: Payer: Self-pay | Admitting: Internal Medicine

## 2021-12-03 NOTE — Progress Notes (Signed)
Follow-up Outpatient Visit Date: 12/05/2021  Primary Care Provider: Renee Rival, NP [place] [address]  Chief Complaint: Follow-up heart failure  HPI:  Lauren Banks is a 56 y.o. female with history of chronic HFrEF due to nonischemic cardiomyopathy, hypertension, hyperlipidemia, frequent PVCs, and tobacco use, who presents for follow-up of heart failure.  I last saw her in July, at which time Lauren Banks complained of intermittent lightheadedness after taking her morning medications.  She was incidentally noted to have frequent PVCs.  Subsequent event monitor showed a 12% PVC burden.  She was referred to Dr. Quentin Ore, who recommended cardiac MRI.  This showed normal biventricular systolic function and mild aortic regurgitation.  No etiology for PVCs was identified.  Continued medical therapy was recommended.  Today, Lauren Banks reports that she has put on about 25 to 30 pounds over the last 6 months, which she attributes to being less active.  Over the last few weeks, she has experienced mild swelling in her ankles at times.  She has not had any chest pain, shortness of breath, palpitations, or lightheadedness.  Home blood pressures have typically been well controlled.  Her only other concern today is of dry mouth and dry skin.  She wonders if this could be due to any of her medications.  She had similar issues many years ago and used a mouthwash to help with her dry mouth.  She notes that labs drawn by her PCP in December raised the possibility of some kidney issues.  She inquires about repeating labs today.  --------------------------------------------------------------------------------------------------  Past Medical History:  Diagnosis Date   Chronic combined systolic (congestive) and diastolic (congestive) heart failure (HCC)    Frequent PVCs    Hyperlipemia    Hypertension    Nonischemic cardiomyopathy [facility])    Past Surgical History:  Procedure Laterality Date    ABDOMINAL HYSTERECTOMY  10/13/2011   Procedure: HYSTERECTOMY ABDOMINAL;  Surgeon: Jonnie Kind, MD;  Location: [facility];  Service: Gynecology;  Laterality: N/A;   CARDIAC CATHETERIZATION     RIGHT/LEFT HEART CATH AND CORONARY ANGIOGRAPHY N/A 11/19/2020   Procedure: RIGHT/LEFT HEART CATH AND CORONARY ANGIOGRAPHY;  Surgeon: Nelva Bush, MD;  Location: [place] CV LAB;  Service: Cardiovascular;  Laterality: N/A;    Current Meds  Medication Sig   albuterol (VENTOLIN HFA) 108 (90 Base) MCG/ACT inhaler Inhale 2 puffs into the lungs every 4 (four) hours as needed for wheezing or shortness of breath.   buPROPion (WELLBUTRIN XL) 300 MG 24 hr tablet Take 300 mg by mouth daily.   ENTRESTO 49-51 MG TAKE 1 TABLET BY MOUTH TWICE DAILY   escitalopram (LEXAPRO) 20 MG tablet Take 20 mg by mouth daily.   famotidine (PEPCID) 20 MG tablet TAKE 1 TABLET BY MOUTH ONCE DAILY.   fluticasone (FLONASE) 50 MCG/ACT nasal spray Place 1 spray into both nostrils daily as needed for allergies or rhinitis.   furosemide (LASIX) 40 MG tablet TAKE 1 TABLET BY MOUTH TWICE DAILY   hydrALAZINE (APRESOLINE) 25 MG tablet TAKE (1) TABLET BY MOUTH THREE TIMES DAILY   ipratropium-albuterol (DUONEB) 0.5-2.5 (3) MG/3ML SOLN Inhale 3 mLs into the lungs every 4 (four) hours as needed (Asthma).   isosorbide dinitrate (ISORDIL) 10 MG tablet Take 10 mg by mouth 3 (three) times daily.   magnesium oxide (MAG-OX) 400 (240 Mg) MG tablet TAKE 1 TABLET BY MOUTH ONCE DAILY.   magnesium oxide (MAG-OX) 400 (241.3 Mg) MG tablet TAKE 1 TABLET BY MOUTH ONCE  DAILY.   metoprolol succinate (TOPROL-XL) 25 MG 24 hr tablet TAKE 1 TABLET BY MOUTH IN THE MORNING AND AT BEDTIME   pantoprazole (PROTONIX) 40 MG tablet Take 1 tablet (40 mg total) by mouth daily. Take 30-60 min before first meal of the day   potassium chloride SA (KLOR-CON) 20 MEQ tablet TAKE 1 TABLET BY MOUTH ONCE DAILY.   spironolactone (ALDACTONE) 25 MG tablet TAKE 1 TABLET BY MOUTH  ONCE DAILY.   venlafaxine XR (EFFEXOR-XR) 37.5 MG 24 hr capsule Take 37.5 mg by mouth daily.    Allergies: Penicillins  Social History   Tobacco Use   Smoking status: Every Day    Packs/day: 0.25    Years: 30.00    Pack years: 7.50    Types: Cigarettes   Smokeless tobacco: Never  Vaping Use   Vaping Use: Former  Substance Use Topics   Alcohol use: No   Drug use: Yes    Frequency: 7.0 times per week    Types: Marijuana    Family History  Problem Relation Age of Onset   Cancer Mother        breast- around age 67, ovarian- around age38   Hypertension Mother    Alzheimer's disease Mother    Hypertension Father    Diabetes Father    Kidney disease Father    Gout Father    Stroke Maternal Grandmother    Kidney disease Maternal Grandmother    Stroke Paternal Grandmother    Diabetes Sister    Obesity Sister    Heart disease Sister    Hypertension Brother    Cancer Brother        lymphnode   Gout Brother    Cancer Maternal Aunt    Anesthesia problems Neg Hx     Review of Systems: A 12-system review of systems was performed and was negative except as noted in the HPI.  --------------------------------------------------------------------------------------------------  Physical Exam: BP 110/60 (BP Location: Left Arm, Patient Position: Sitting, Cuff Size: Large)    Pulse 73    Ht 5\' 10"  (1.778 m)    Wt 272 lb 4 oz (123.5 kg)    LMP 09/21/2011 Comment: partial   SpO2 96%    BMI 39.06 kg/m   General:  NAD. Neck: No JVD or HJR. Lungs: Clear to auscultation bilaterally without wheezes or crackles. Heart: Regular rate and rhythm with frequent extrasystoles.  No murmurs, rubs, or gallops. Abdomen: Soft, nontender, nondistended. Extremities: No lower extremity edema.  EKG:  Normal sinus rhythm with frequent PVC's (bigeminy) and right bundle branch block.  Compared with prior tracing from 07/16/2021, PVC's are more frequent on today's tracing.  Lab Results  Component  Value Date   WBC 8.2 07/10/2021   HGB 13.8 07/10/2021   HCT 42.9 07/10/2021   MCV 86.5 07/10/2021   PLT 339 07/10/2021    Lab Results  Component Value Date   NA 137 12/12/2020   K 4.0 12/12/2020   CL 99 12/12/2020   CO2 23 12/12/2020   BUN 9 12/12/2020   CREATININE 1.00 12/12/2020   GLUCOSE 93 12/12/2020   ALT 18 10/30/2020    Lab Results  Component Value Date   CHOL 231 (H) 10/30/2020   HDL 29 (L) 10/30/2020   LDLCALC 153 (H) 10/30/2020   LDLDIRECT 164 (H) 10/30/2020   TRIG 262 (H) 10/30/2020   CHOLHDL 8.0 (H) 10/30/2020    --------------------------------------------------------------------------------------------------  ASSESSMENT AND PLAN: Chronic HFrEF due to nonischemic cardiomyopathy: Lauren Banks has put on  25 to 30 pounds over the last 6 months per her report.  She appears fairly euvolemic on examination today with NYHA class I symptoms.  I suspect much of her weight gain is due to dietary indiscretion rather than worsening heart failure.  We will continue with furosemide 40 mg twice daily for volume management.  We will also continue metoprolol, Entresto, spironolactone, hydralazine, and isosorbide dinitrate for heart failure therapy.  We could consider addition of SGLT2 inhibitor in the future as well if her heart failure were to worsen.  Last echo in April showed EF of 45-50%.  Frequent PVCs: Ventricular bigeminy noted today on EKG.  Lauren Banks is asymptomatic.  She was previously seen by Dr. Quentin Ore (EP) who recommended continuation of beta-blocker.  No further intervention was recommended.  I will check a CMP, magnesium, and TSH today.  Renal insufficiency: Lauren Banks reports recent labs to her PCP showed some kidney issues.  We will plan to repeat a CMP today.  Hyperlipidemia: Elevated LDL and triglycerides noted on last lipid panel in our system.  Recommend lifestyle modifications to help improve lipid profile, with ongoing management and surveillance through her  PCP.  Follow-up: Return to clinic in 6 months.  Nelva Bush, MD 12/05/2021 8:58 AM

## 2021-12-05 ENCOUNTER — Encounter: Payer: Self-pay | Admitting: Internal Medicine

## 2021-12-05 ENCOUNTER — Ambulatory Visit: Payer: BC Managed Care – PPO | Admitting: Internal Medicine

## 2021-12-05 ENCOUNTER — Other Ambulatory Visit: Payer: Self-pay | Admitting: Internal Medicine

## 2021-12-05 ENCOUNTER — Other Ambulatory Visit: Payer: Self-pay

## 2021-12-05 VITALS — BP 110/60 | HR 73 | Ht 70.0 in | Wt 272.2 lb

## 2021-12-05 DIAGNOSIS — I10 Essential (primary) hypertension: Secondary | ICD-10-CM | POA: Diagnosis not present

## 2021-12-05 DIAGNOSIS — N289 Disorder of kidney and ureter, unspecified: Secondary | ICD-10-CM

## 2021-12-05 DIAGNOSIS — I428 Other cardiomyopathies: Secondary | ICD-10-CM

## 2021-12-05 DIAGNOSIS — I5022 Chronic systolic (congestive) heart failure: Secondary | ICD-10-CM

## 2021-12-05 DIAGNOSIS — I493 Ventricular premature depolarization: Secondary | ICD-10-CM | POA: Diagnosis not present

## 2021-12-05 DIAGNOSIS — E782 Mixed hyperlipidemia: Secondary | ICD-10-CM

## 2021-12-05 NOTE — Patient Instructions (Signed)
Medication Instructions:   Your physician recommends that you continue on your current medications as directed. Please refer to the Current Medication list given to you today.  *If you need a refill on your cardiac medications before your next appointment, please call your pharmacy*   Lab Work:  Today: CMET, Magnesium, TSH  If you have labs (blood work) drawn today and your tests are completely normal, you will receive your results only by: [place] (if you have MyChart) OR A paper copy in the mail If you have any lab test that is abnormal or we need to change your treatment, we will call you to review the results.   Testing/Procedures:  None ordered   Follow-Up: At [facility], you and your health needs are our priority.  As part of our continuing mission to provide you with exceptional heart care, we have created designated Provider Care Teams.  These Care Teams include your primary Cardiologist (physician) and Advanced Practice Providers (APPs -  Physician Assistants and Nurse Practitioners) who all work together to provide you with the care you need, when you need it.  We recommend signing up for the patient portal called "MyChart".  Sign up information is provided on this After Visit Summary.  MyChart is used to connect with patients for Virtual Visits (Telemedicine).  Patients are able to view lab/test results, encounter notes, upcoming appointments, etc.  Non-urgent messages can be sent to your provider as well.   To learn more about what you can do with MyChart, go to [email or web address].    Your next appointment:   6 month(s)  The format for your next appointment:   In Person  Provider:   You may see Nelva Bush, MD or one of the following Advanced Practice Providers on your designated Care Team:   Murray Hodgkins, NP Christell Faith, PA-C Cadence Kathlen Mody, [place]

## 2021-12-05 NOTE — Telephone Encounter (Signed)
Please advise if ok to refill Magnesium 400 mg qd.

## 2021-12-06 ENCOUNTER — Encounter: Payer: Self-pay | Admitting: Internal Medicine

## 2021-12-06 LAB — COMPREHENSIVE METABOLIC PANEL
ALT: 13 IU/L (ref 0–32)
AST: 19 IU/L (ref 0–40)
Albumin/Globulin Ratio: 2.3 — ABNORMAL HIGH (ref 1.2–2.2)
Albumin: 4.5 g/dL (ref 3.8–4.9)
Alkaline Phosphatase: 121 IU/L (ref 44–121)
BUN/Creatinine Ratio: 12 (ref 9–23)
BUN: 13 mg/dL (ref 6–24)
Bilirubin Total: 0.2 mg/dL (ref 0.0–1.2)
CO2: 24 mmol/L (ref 20–29)
Calcium: 10 mg/dL (ref 8.7–10.2)
Chloride: 100 mmol/L (ref 96–106)
Creatinine, Ser: 1.1 mg/dL — ABNORMAL HIGH (ref 0.57–1.00)
Globulin, Total: 2 g/dL (ref 1.5–4.5)
Glucose: 94 mg/dL (ref 70–99)
Potassium: 4.6 mmol/L (ref 3.5–5.2)
Sodium: 139 mmol/L (ref 134–144)
Total Protein: 6.5 g/dL (ref 6.0–8.5)
eGFR: 59 mL/min/{1.73_m2} — ABNORMAL LOW (ref >59)

## 2021-12-06 LAB — TSH: TSH: 1.55 u[IU]/mL (ref 0.450–4.500)

## 2021-12-06 LAB — MAGNESIUM: Magnesium: 1.9 mg/dL (ref 1.6–2.3)

## 2022-01-06 ENCOUNTER — Other Ambulatory Visit: Payer: Self-pay | Admitting: Internal Medicine

## 2022-01-06 NOTE — Telephone Encounter (Signed)
Please advise if ok to refill.  ?Magnesium 400 mg qd. ?

## 2022-01-07 NOTE — Telephone Encounter (Signed)
Ok to refill. She may also take otc. Thank you.  ?

## 2022-02-02 ENCOUNTER — Other Ambulatory Visit: Payer: Self-pay | Admitting: Cardiology

## 2022-02-02 ENCOUNTER — Other Ambulatory Visit: Payer: Self-pay | Admitting: Internal Medicine

## 2022-03-05 ENCOUNTER — Other Ambulatory Visit: Payer: Self-pay | Admitting: Internal Medicine

## 2022-04-02 ENCOUNTER — Other Ambulatory Visit: Payer: Self-pay | Admitting: Internal Medicine

## 2022-04-15 DIAGNOSIS — E559 Vitamin D deficiency, unspecified: Secondary | ICD-10-CM | POA: Diagnosis not present

## 2022-04-15 DIAGNOSIS — Z79899 Other long term (current) drug therapy: Secondary | ICD-10-CM | POA: Diagnosis not present

## 2022-04-15 DIAGNOSIS — R7303 Prediabetes: Secondary | ICD-10-CM | POA: Diagnosis not present

## 2022-04-15 DIAGNOSIS — E782 Mixed hyperlipidemia: Secondary | ICD-10-CM | POA: Diagnosis not present

## 2022-04-27 ENCOUNTER — Other Ambulatory Visit: Payer: Self-pay | Admitting: Internal Medicine

## 2022-04-27 DIAGNOSIS — F419 Anxiety disorder, unspecified: Secondary | ICD-10-CM | POA: Diagnosis not present

## 2022-04-27 DIAGNOSIS — R609 Edema, unspecified: Secondary | ICD-10-CM | POA: Diagnosis not present

## 2022-04-27 DIAGNOSIS — E782 Mixed hyperlipidemia: Secondary | ICD-10-CM | POA: Diagnosis not present

## 2022-04-27 DIAGNOSIS — D72829 Elevated white blood cell count, unspecified: Secondary | ICD-10-CM | POA: Diagnosis not present

## 2022-05-27 ENCOUNTER — Other Ambulatory Visit: Payer: Self-pay | Admitting: Internal Medicine

## 2022-05-28 ENCOUNTER — Other Ambulatory Visit: Payer: Self-pay | Admitting: Internal Medicine

## 2022-05-28 DIAGNOSIS — R058 Other specified cough: Secondary | ICD-10-CM

## 2022-06-30 ENCOUNTER — Other Ambulatory Visit: Payer: Self-pay

## 2022-06-30 MED ORDER — METOPROLOL SUCCINATE ER 25 MG PO TB24: ORAL_TABLET | ORAL | 0 refills | Status: DC

## 2022-06-30 MED ORDER — ENTRESTO 49-51 MG PO TABS: 1.0000 | ORAL_TABLET | Freq: Two times a day (BID) | ORAL | 0 refills | Status: DC

## 2022-06-30 MED ORDER — SPIRONOLACTONE 25 MG PO TABS: 25.0000 mg | ORAL_TABLET | Freq: Every day | ORAL | 0 refills | Status: DC

## 2022-07-07 NOTE — Progress Notes (Deleted)
Cardiology Office Note    Date:  07/07/2022   ID:  Lauren Banks, DOB Jun 11, 1966, MRN [id number]  PCP:  Renee Rival, NP  Cardiologist:  Nelva Bush, MD  Electrophysiologist:  Vickie Epley, MD   Chief Complaint: Follow-up  History of Present Illness:   Lauren Banks is a 56 y.o. female with history of HFrEF secondary to NICM, frequent PVCs, HTN, HLD, and tobacco use who presents for follow-up of her cardiomyopathy and PVCs.  She was evaluated by Dr. Saunders Revel as a new patient on 10/02/2020 at the request of her pulmonologist for evaluation of PVCs.  Echo on 10/23/2020 which showed an EF of 35 to 40%, global hypokinesis, grade 2 diastolic dysfunction, mildly reduced RV systolic function with a mildly enlarged RV cavity size, mild to moderate mitral regurgitation, mild to moderate aortic valve insufficiency with moderate aortic valve sclerosis with no evidence of aortic valve stenosis, and a right atrial pressure of 15 mmHg.  R/LHC in 11/2020 showed no angiographically significant CAD with severely elevated left heart, right heart, and pulmonary arterial pressures.  Severely reduced cardiac output/index in the context of markedly elevated systemic blood pressure.  She was admitted for diuresis and optimization of medical therapy.  Repeat limited echo in 01/2021 demonstrated an EF of 45 to 50%, no regional wall motion abnormalities, low normal RV systolic function with normal ventricular cavity size, trivial mitral regurgitation, and trivial aortic insufficiency.  Subsequent event monitoring showed a 12% PVC burden.  She was referred to EP with cardiac MRI showing normal biventricular systolic function and mild aortic regurgitation.  No etiology for PVCs was identified.  She was last seen in the office in 12/2021 and had put on 25 to 30 pounds over the preceding 6 months, which she attributed to being more sedentary.  She did note a several week history of mild ankle swelling.  She was euvolemic  with weight gain felt to be related to dietary indiscretion.  She was in ventricular bigeminy on EKG with recommendation to continue beta-blocker under the direction of EP.  ***   Labs independently reviewed: 12/2021 - TSH normal, magnesium 1.9, BUN 13, serum creatinine 1.1, potassium 4.6, albumin 4.5, AST/ALT normal, Hgb 13.8, PLT 339 10/2020 - direct LDL 164, TC 231, TG 262, HDL 29  Past Medical History:  Diagnosis Date   Chronic combined systolic (congestive) and diastolic (congestive) heart failure (HCC)    Frequent PVCs    Hyperlipemia    Hypertension    Nonischemic cardiomyopathy [facility])     Past Surgical History:  Procedure Laterality Date   ABDOMINAL HYSTERECTOMY  10/13/2011   Procedure: HYSTERECTOMY ABDOMINAL;  Surgeon: Jonnie Kind, MD;  Location: [facility];  Service: Gynecology;  Laterality: N/A;   CARDIAC CATHETERIZATION     RIGHT/LEFT HEART CATH AND CORONARY ANGIOGRAPHY N/A 11/19/2020   Procedure: RIGHT/LEFT HEART CATH AND CORONARY ANGIOGRAPHY;  Surgeon: Nelva Bush, MD;  Location: [facility];  Service: Cardiovascular;  Laterality: N/A;    Current Medications: No outpatient medications have been marked as taking for the 07/08/22 encounter (Appointment) with Rise Mu, PA-C.    Allergies:   Penicillins   Social History   Socioeconomic History   Marital status: Married    Spouse name: Not on file   Number of children: Not on file   Years of education: Not on file   Highest education level: Not on file  Occupational History   Not on file  Tobacco Use  Smoking status: Every Day    Packs/day: 0.25    Years: 30.00    Total pack years: 7.50    Types: Cigarettes   Smokeless tobacco: Never  Vaping Use   Vaping Use: Former  Substance and Sexual Activity   Alcohol use: No   Drug use: Yes    Frequency: 7.0 times per week    Types: Marijuana   Sexual activity: Yes    Birth control/protection: None, Surgical  Other Topics Concern   Not on file   Social History Narrative   Not on file   Social Determinants of Health   Financial Resource Strain: Not on file  Food Insecurity: Not on file  Transportation Needs: Not on file  Physical Activity: Not on file  Stress: Not on file  Social Connections: Not on file     Family History:  The patient's family history includes Alzheimer's disease in her mother; Cancer in her brother, maternal aunt, and mother; Diabetes in her father and sister; Gout in her brother and father; Heart disease in her sister; Hypertension in her brother, father, and mother; Kidney disease in her father and maternal grandmother; Obesity in her sister; Stroke in her maternal grandmother and paternal grandmother. There is no history of Anesthesia problems.  ROS:   ROS   EKGs/Labs/Other Studies Reviewed:    Studies reviewed were summarized above. The additional studies were reviewed today:  2D echo 10/23/2020:  1. Left ventricular ejection fraction, by estimation, is 35 to 40%. Left  ventricular ejection fraction by 3D volume is 43 %. The left ventricle has  moderately decreased function. The left ventricle demonstrates global  hypokinesis. Left ventricular  diastolic parameters are consistent with Grade II diastolic dysfunction  (pseudonormalization). The average left ventricular global longitudinal  strain is -10.2 %. The global longitudinal strain is abnormal.   2. Right ventricular systolic function is mildly reduced. The right  ventricular size is mildly enlarged.   3. The mitral valve is normal in structure. Mild to moderate mitral valve  regurgitation.   4. The aortic valve was not well visualized. Aortic valve regurgitation  is mild to moderate. Mild aortic valve sclerosis is present, with no  evidence of aortic valve stenosis.   5. The inferior vena cava is dilated in size with <50% respiratory  variability, suggesting right atrial pressure of 15 mmHg. __________  [facility]  11/19/2020: Conclusions: No angiographically significant coronary artery disease consistent with nonischemic cardiomyopathy. Severely elevated left heart, right heart, and pulmonary artery pressures. Severely reduced Fick cardiac output/index in the setting of markedly elevated systemic blood pressure.   Recommendations: Admit for diuresis and optimization of goal-directed medical therapy. Consider advanced heart failure consultation, ideally as an outpatient but potentially as transfer to [facility] if patient fails to respond to inotrope therapy and escalation of evidence-based heart failure agents. __________  Limited echo 02/05/2021: 1. Left ventricular ejection fraction, by estimation, is 45 to 50%. The  left ventricle has mildly decreased function. The left ventricle has no  regional wall motion abnormalities.   2. Right ventricular systolic function is low normal. The right  ventricular size is normal.   3. The mitral valve is normal in structure. Trivial mitral valve  regurgitation.   4. The aortic valve is tricuspid. Aortic valve regurgitation is trivial. __________  Elwyn Reach patch 05/2021:The patient was monitored for 2 days, 10 hours. The predominant rhythm was sinus with an average rate of 74 bpm (range 57-114 bpm). There were rare PACs and frequent  PVCs (12.2% PVC burden). No sustained arrhythmia or prolonged pause was observed. Patient triggered/diary events correspond to sinus rhythm.   Predominantly sinus rhythm with frequent PVCs (12% burden). __________  Cardiac MRI 07/14/2021: IMPRESSION: 1. Normal LV size and thickness. low normal LV function, LVEF 53%. 2. Normal RV size and thickness, mildly reduced RV systolic function RVEF 89%. 3. There is no evidence for late gadolinium enhancement or scar. 4. Mild aortic regurgitation. 5. No findings to suggest etiology for PVC's.   EKG:  EKG is ordered today.  The EKG ordered today demonstrates ***  Recent Labs: 07/10/2021:  Hemoglobin 13.8; Platelets 339 12/05/2021: ALT 13; BUN 13; Creatinine, Ser 1.10; Magnesium 1.9; Potassium 4.6; Sodium 139; TSH 1.550  Recent Lipid Panel    Component Value Date/Time   CHOL 231 (H) 10/30/2020 1439   TRIG 262 (H) 10/30/2020 1439   HDL 29 (L) 10/30/2020 1439   CHOLHDL 8.0 (H) 10/30/2020 1439   LDLCALC 153 (H) 10/30/2020 1439   LDLDIRECT 164 (H) 10/30/2020 1439    PHYSICAL EXAM:    VS:  LMP 09/21/2011 Comment: partial  BMI: There is no height or weight on file to calculate BMI.  Physical Exam  Wt Readings from Last 3 Encounters:  12/05/21 272 lb 4 oz (123.5 kg)  07/16/21 263 lb (119.3 kg)  06/11/21 260 lb (117.9 kg)     ASSESSMENT & PLAN:   HFrEF secondary to NICM:  Frequent PVCs:  HLD/hypertriglyceridemia: LDL 164 with a triglyceride of 262 on last check.  Renal insufficiency:   {Are you ordering a CV Procedure (e.g. stress test, cath, DCCV, TEE, etc)?   Press F2        :[id number]}     Disposition: F/u with Dr. Saunders Revel or an APP in ***.   Medication Adjustments/Labs and Tests Ordered: Current medicines are reviewed at length with the patient today.  Concerns regarding medicines are outlined above. Medication changes, Labs and Tests ordered today are summarized above and listed in the Patient Instructions accessible in Encounters.   Signed, Christell Faith, PA-C 07/07/2022 9:54 AM     [place] [address] [place] [address] [place], [place] [address] [phone]

## 2022-07-08 ENCOUNTER — Ambulatory Visit: Payer: BC Managed Care – PPO | Admitting: Physician Assistant

## 2022-07-13 NOTE — Progress Notes (Unsigned)
Cardiology Office Note    Date:  07/14/2022   ID:  Lauren Banks, DOB 26-Mar-1966, MRN [id number]  PCP:  Renee Rival, NP  Cardiologist:  Nelva Bush, MD  Electrophysiologist:  Vickie Epley, MD   Chief Complaint: Follow-up  History of Present Illness:   Lauren Banks is a 56 y.o. female with history of HFrEF secondary to NICM with subsequent normalization of LV systolic function by cardiac MRI in 07/2021, frequent PVCs, HTN, HLD, and tobacco use who presents for follow-up of her cardiomyopathy and PVCs.  She was evaluated by Dr. Saunders Revel as a new patient on 10/02/2020 at the request of her pulmonologist for evaluation of PVCs.  Echo on 10/23/2020 which showed an EF of 35 to 40%, global hypokinesis, grade 2 diastolic dysfunction, mildly reduced RV systolic function with a mildly enlarged RV cavity size, mild to moderate mitral regurgitation, mild to moderate aortic valve insufficiency with moderate aortic valve sclerosis with no evidence of aortic valve stenosis, and a right atrial pressure of 15 mmHg.  R/LHC in 11/2020 showed no angiographically significant CAD with severely elevated left heart, right heart, and pulmonary arterial pressures.  Severely reduced cardiac output/index in the context of markedly elevated systemic blood pressure.  She was admitted for diuresis and optimization of medical therapy.  Repeat limited echo in 01/2021 demonstrated an EF of 45 to 50%, no regional wall motion abnormalities, low normal RV systolic function with normal ventricular cavity size, trivial mitral regurgitation, and trivial aortic insufficiency.  Subsequent event monitoring showed a 12% PVC burden.  She was referred to EP with cardiac MRI showing normal biventricular systolic function and mild aortic regurgitation.  No etiology for PVCs was identified.  She was last seen in the office in 12/2021 and had put on 25 to 30 pounds over the preceding 6 months, which she attributed to being more sedentary.   She did note a several week history of mild ankle swelling.  She was euvolemic with weight gain felt to be related to dietary indiscretion.  She was in ventricular bigeminy on EKG with recommendation to continue beta-blocker under the direction of EP.  She comes in doing well from a cardiac perspective and is without symptoms of angina or decompensation.  She does note some dizziness at times.  No frank syncope.  No lower extremity swelling or orthopnea.  No dyspnea or palpitations.  She is adherent and tolerating cardiac medications without issues.  Her weight is down 4 pounds when compared to her last clinic visit.   Labs independently reviewed: 12/2021 - TSH normal, magnesium 1.9, BUN 13, serum creatinine 1.1, potassium 4.6, albumin 4.5, AST/ALT normal, Hgb 13.8, PLT 339 10/2020 - direct LDL 164, TC 231, TG 262, HDL 29    Past Medical History:  Diagnosis Date   Chronic combined systolic (congestive) and diastolic (congestive) heart failure (HCC)    Frequent PVCs    Hyperlipemia    Hypertension    Nonischemic cardiomyopathy [facility])     Past Surgical History:  Procedure Laterality Date   ABDOMINAL HYSTERECTOMY  10/13/2011   Procedure: HYSTERECTOMY ABDOMINAL;  Surgeon: Jonnie Kind, MD;  Location: [facility];  Service: Gynecology;  Laterality: N/A;   CARDIAC CATHETERIZATION     RIGHT/LEFT HEART CATH AND CORONARY ANGIOGRAPHY N/A 11/19/2020   Procedure: RIGHT/LEFT HEART CATH AND CORONARY ANGIOGRAPHY;  Surgeon: Nelva Bush, MD;  Location: [place] [facility];  Service: Cardiovascular;  Laterality: N/A;    Current Medications: Current Meds  Medication Sig  albuterol (VENTOLIN HFA) 108 (90 Base) MCG/ACT inhaler Inhale 2 puffs into the lungs every 4 (four) hours as needed for wheezing or shortness of breath.   buPROPion (WELLBUTRIN XL) 300 MG 24 hr tablet Take 300 mg by mouth daily.   escitalopram (LEXAPRO) 20 MG tablet Take 20 mg by mouth daily.   famotidine (PEPCID) 20 MG tablet  TAKE 1 TABLET BY MOUTH ONCE DAILY.   fluticasone (FLONASE) 50 MCG/ACT nasal spray Place 1 spray into both nostrils daily as needed for allergies or rhinitis.   furosemide (LASIX) 40 MG tablet TAKE 1 TABLET BY MOUTH TWICE DAILY   hydrALAZINE (APRESOLINE) 25 MG tablet TAKE (1) TABLET BY MOUTH THREE TIMES DAILY   ipratropium-albuterol (DUONEB) 0.5-2.5 (3) MG/3ML SOLN Inhale 3 mLs into the lungs every 4 (four) hours as needed (Asthma).   isosorbide dinitrate (ISORDIL) 10 MG tablet Take 10 mg by mouth 3 (three) times daily.   magnesium oxide (MAG-OX) 400 (240 Mg) MG tablet TAKE 1 TABLET BY MOUTH ONCE DAILY.   metoprolol succinate (TOPROL-XL) 25 MG 24 hr tablet TAKE 1 TABLET BY MOUTH IN THE MORNING AND AT BEDTIME   pantoprazole (PROTONIX) 40 MG tablet Take 1 tablet (40 mg total) by mouth daily. Take 30-60 min before first meal of the day   potassium chloride SA (KLOR-CON) 20 MEQ tablet TAKE 1 TABLET BY MOUTH ONCE DAILY.   sacubitril-valsartan (ENTRESTO) 49-51 MG Take 1 tablet by mouth 2 (two) times daily.   spironolactone (ALDACTONE) 25 MG tablet Take 1 tablet (25 mg total) by mouth daily.   venlafaxine XR (EFFEXOR-XR) 37.5 MG 24 hr capsule Take 37.5 mg by mouth daily.    Allergies:   Penicillins   Social History   Socioeconomic History   Marital status: Married    Spouse name: Not on file   Number of children: Not on file   Years of education: Not on file   Highest education level: Not on file  Occupational History   Not on file  Tobacco Use   Smoking status: Every Day    Packs/day: 0.25    Years: 30.00    Total pack years: 7.50    Types: Cigarettes   Smokeless tobacco: Never   Tobacco comments:    2 packs a week.  Vaping Use   Vaping Use: Former  Substance and Sexual Activity   Alcohol use: No   Drug use: Yes    Frequency: 7.0 times per week    Types: Marijuana   Sexual activity: Yes    Birth control/protection: None, Surgical  Other Topics Concern   Not on file  Social  History Narrative   Not on file   Social Determinants of Health   Financial Resource Strain: Not on file  Food Insecurity: Not on file  Transportation Needs: Not on file  Physical Activity: Not on file  Stress: Not on file  Social Connections: Not on file     Family History:  The patient's family history includes Alzheimer's disease in her mother; Cancer in her brother, maternal aunt, and mother; Diabetes in her father and sister; Gout in her brother and father; Heart disease in her sister; Hypertension in her brother, father, and mother; Kidney disease in her father and maternal grandmother; Obesity in her sister; Stroke in her maternal grandmother and paternal grandmother. There is no history of Anesthesia problems.  ROS:   12-point review of systems is negative unless otherwise noted in the HPI.   EKGs/Labs/Other Studies Reviewed:  Studies reviewed were summarized above. The additional studies were reviewed today:  2D echo 10/23/2020:  1. Left ventricular ejection fraction, by estimation, is 35 to 40%. Left  ventricular ejection fraction by 3D volume is 43 %. The left ventricle has  moderately decreased function. The left ventricle demonstrates global  hypokinesis. Left ventricular  diastolic parameters are consistent with Grade II diastolic dysfunction  (pseudonormalization). The average left ventricular global longitudinal  strain is -10.2 %. The global longitudinal strain is abnormal.   2. Right ventricular systolic function is mildly reduced. The right  ventricular size is mildly enlarged.   3. The mitral valve is normal in structure. Mild to moderate mitral valve  regurgitation.   4. The aortic valve was not well visualized. Aortic valve regurgitation  is mild to moderate. Mild aortic valve sclerosis is present, with no  evidence of aortic valve stenosis.   5. The inferior vena cava is dilated in size with <50% respiratory  variability, suggesting right atrial  pressure of 15 mmHg. __________  [facility] 11/19/2020: Conclusions: No angiographically significant coronary artery disease consistent with nonischemic cardiomyopathy. Severely elevated left heart, right heart, and pulmonary artery pressures. Severely reduced Fick cardiac output/index in the setting of markedly elevated systemic blood pressure.   Recommendations: Admit for diuresis and optimization of goal-directed medical therapy. Consider advanced heart failure consultation, ideally as an outpatient but potentially as transfer to [facility] if patient fails to respond to inotrope therapy and escalation of evidence-based heart failure agents. __________  Limited echo 02/05/2021: 1. Left ventricular ejection fraction, by estimation, is 45 to 50%. The  left ventricle has mildly decreased function. The left ventricle has no  regional wall motion abnormalities.   2. Right ventricular systolic function is low normal. The right  ventricular size is normal.   3. The mitral valve is normal in structure. Trivial mitral valve  regurgitation.   4. The aortic valve is tricuspid. Aortic valve regurgitation is trivial. __________  Elwyn Reach patch 05/2021:The patient was monitored for 2 days, 10 hours. The predominant rhythm was sinus with an average rate of 74 bpm (range 57-114 bpm). There were rare PACs and frequent PVCs (12.2% PVC burden). No sustained arrhythmia or prolonged pause was observed. Patient triggered/diary events correspond to sinus rhythm.   Predominantly sinus rhythm with frequent PVCs (12% burden). __________  Cardiac MRI 07/14/2021: IMPRESSION: 1. Normal LV size and thickness. low normal LV function, LVEF 53%. 2. Normal RV size and thickness, mildly reduced RV systolic function RVEF 73%. 3. There is no evidence for late gadolinium enhancement or scar. 4. Mild aortic regurgitation. 5. No findings to suggest etiology for PVC's.   EKG:  EKG is ordered today.  The EKG ordered today  demonstrates NSR, 78 bpm, frequent PVCs in a pattern of ventricular bigeminy, RBBB, no acute ST-T changes, no significant change when compared to prior tracing  Recent Labs: 12/05/2021: ALT 13; BUN 13; Creatinine, Ser 1.10; Magnesium 1.9; Potassium 4.6; Sodium 139; TSH 1.550  Recent Lipid Panel    Component Value Date/Time   CHOL 231 (H) 10/30/2020 1439   TRIG 262 (H) 10/30/2020 1439   HDL 29 (L) 10/30/2020 1439   CHOLHDL 8.0 (H) 10/30/2020 1439   LDLCALC 153 (H) 10/30/2020 1439   LDLDIRECT 164 (H) 10/30/2020 1439    PHYSICAL EXAM:    VS:  BP 100/80 (BP Location: Left Arm, Patient Position: Sitting, Cuff Size: Large)   Pulse 78   Ht '5\' 10"'$  (1.778 m)   Wt 268  lb 9.6 oz (121.8 kg)   LMP 09/21/2011 Comment: partial  SpO2 99%   BMI 38.54 kg/m   BMI: Body mass index is 38.54 kg/m.  Physical Exam Vitals reviewed.  Constitutional:      Appearance: She is well-developed.  HENT:     Head: Normocephalic and atraumatic.  Eyes:     General:        Right eye: No discharge.        Left eye: No discharge.  Neck:     Vascular: No JVD.  Cardiovascular:     Rate and Rhythm: Normal rate and regular rhythm. Frequent Extrasystoles are present.    Pulses:          Dorsalis pedis pulses are 2+ on the right side and 2+ on the left side.       Posterior tibial pulses are 2+ on the right side and 2+ on the left side.     Heart sounds: Normal heart sounds, S1 normal and S2 normal. Heart sounds not distant. No midsystolic click and no opening snap. No murmur heard.    No friction rub.  Pulmonary:     Effort: Pulmonary effort is normal. No respiratory distress.     Breath sounds: Normal breath sounds. No decreased breath sounds, wheezing or rales.  Chest:     Chest wall: No tenderness.  Abdominal:     General: There is no distension.  Musculoskeletal:     Cervical back: Normal range of motion.     Right lower leg: No edema.     Left lower leg: No edema.  Skin:    General: Skin is warm and  dry.     Nails: There is no clubbing.  Neurological:     Mental Status: She is alert and oriented to person, place, and time.  Psychiatric:        Speech: Speech normal.        Behavior: Behavior normal.        Thought Content: Thought content normal.        Judgment: Judgment normal.     Wt Readings from Last 3 Encounters:  07/14/22 268 lb 9.6 oz (121.8 kg)  12/05/21 272 lb 4 oz (123.5 kg)  07/16/21 263 lb (119.3 kg)     ASSESSMENT & PLAN:   HFrEF secondary to NICM: She appears euvolemic and well compensated with NYHA class I symptoms.  Continue current GDMT including Toprol-XL, Entresto, hydralazine and isosorbide dinitrate, spironolactone, and furosemide.  Most recent echo showed an EF of 45 to 50%.  Defer addition of SGLT2 inhibitor at this time secondary to relative hypotension.  Frequent PVCs: She remains in ventricular bigeminy, of uncertain duration.  Previously evaluated by EP who recommended continuation of beta-blocker with no further intervention recommended.  Given I cannot exclude she has been in persistent ventricular bigeminy since 12/2021, we will repeat a 3-day Zio patch to quantify PVC burden and evaluate for persistent ventricular bigeminy.  If we see that she has remained in ventricular bigeminy while wearing outpatient cardiac monitoring, or if there is a significant PVC burden, we will refer her to EP for further recommendations.  She remains on Toprol-XL.  Relative hypotension precludes escalation of beta-blocker.  HLD/hypertriglyceridemia: LDL 164 with a triglyceride of 262 on last check.  Continue with lifestyle modification and ongoing surveillance per PCP.  Renal insufficiency: Stable on recent check.  This was not discussed in detail today.    Disposition: F/u with Dr. Saunders Revel or an  APP in 8 weeks.   Medication Adjustments/Labs and Tests Ordered: Current medicines are reviewed at length with the patient today.  Concerns regarding medicines are outlined above.  Medication changes, Labs and Tests ordered today are summarized above and listed in the Patient Instructions accessible in Encounters.   Signed, Christell Faith, PA-C 07/14/2022 9:35 AM     [address] [place], [place] [address] [phone]

## 2022-07-14 ENCOUNTER — Encounter: Payer: Self-pay | Admitting: Physician Assistant

## 2022-07-14 ENCOUNTER — Ambulatory Visit (INDEPENDENT_AMBULATORY_CARE_PROVIDER_SITE_OTHER): Payer: BC Managed Care – PPO

## 2022-07-14 ENCOUNTER — Ambulatory Visit: Payer: BC Managed Care – PPO | Attending: Physician Assistant | Admitting: Physician Assistant

## 2022-07-14 VITALS — BP 100/80 | HR 78 | Ht 70.0 in | Wt 268.6 lb

## 2022-07-14 DIAGNOSIS — I428 Other cardiomyopathies: Secondary | ICD-10-CM

## 2022-07-14 DIAGNOSIS — E781 Pure hyperglyceridemia: Secondary | ICD-10-CM

## 2022-07-14 DIAGNOSIS — E782 Mixed hyperlipidemia: Secondary | ICD-10-CM

## 2022-07-14 DIAGNOSIS — I493 Ventricular premature depolarization: Secondary | ICD-10-CM | POA: Diagnosis not present

## 2022-07-14 DIAGNOSIS — I5022 Chronic systolic (congestive) heart failure: Secondary | ICD-10-CM | POA: Diagnosis not present

## 2022-07-14 NOTE — Patient Instructions (Signed)
Medication Instructions:  Your physician recommends that you continue on your current medications as directed. Please refer to the Current Medication list given to you today.  *If you need a refill on your cardiac medications before your next appointment, please call your pharmacy*   Lab Work: None  If you have labs (blood work) drawn today and your tests are completely normal, you will receive your results only by: [place] (if you have MyChart) OR A paper copy in the mail If you have any lab test that is abnormal or we need to change your treatment, we will call you to review the results.   Testing/Procedures: Your physician has recommended that you wear a Zio monitor.   This monitor is a medical device that records the heart's electrical activity. Doctors most often use these monitors to diagnose arrhythmias. Arrhythmias are problems with the speed or rhythm of the heartbeat. The monitor is a small device applied to your chest. You can wear one while you do your normal daily activities. While wearing this monitor if you have any symptoms to push the button and record what you felt. Once you have worn this monitor for the period of time provider prescribed (Usually 14 days), you will return the monitor device in the postage paid box. Once it is returned they will download the data collected and provide [place] with a report which the provider will then review and we will call you with those results. Important tips:  Avoid showering during the first 24 hours of wearing the monitor. Avoid excessive sweating to help maximize wear time. Do not submerge the device, no hot tubs, and no swimming pools. Keep any lotions or oils away from the patch. After 24 hours you may shower with the patch on. Take brief showers with your back facing the shower head.  Do not remove patch once it has been placed because that will interrupt data and decrease adhesive wear time. Push the button when you have  any symptoms and write down what you were feeling. Once you have completed wearing your monitor, remove and place into box which has postage paid and place in your outgoing mailbox.  If for some reason you have misplaced your box then call our office and we can provide another box and/or mail it off for you.      Follow-Up: At [facility], you and your health needs are our priority.  As part of our continuing mission to provide you with exceptional heart care, we have created designated Provider Care Teams.  These Care Teams include your primary Cardiologist (physician) and Advanced Practice Providers (APPs -  Physician Assistants and Nurse Practitioners) who all work together to provide you with the care you need, when you need it.  We recommend signing up for the patient portal called "MyChart".  Sign up information is provided on this After Visit Summary.  MyChart is used to connect with patients for Virtual Visits (Telemedicine).  Patients are able to view lab/test results, encounter notes, upcoming appointments, etc.  Non-urgent messages can be sent to your provider as well.   To learn more about what you can do with MyChart, go to [email or web address].    Your next appointment:   8 week(s)  The format for your next appointment:   In Person  Provider:   Nelva Bush, MD or Christell Faith, PA-C     Important Information About Sugar

## 2022-07-22 ENCOUNTER — Other Ambulatory Visit: Payer: Self-pay

## 2022-07-22 MED ORDER — ISOSORBIDE DINITRATE 10 MG PO TABS: 10.0000 mg | ORAL_TABLET | Freq: Three times a day (TID) | ORAL | 3 refills | Status: DC

## 2022-07-22 MED ORDER — FUROSEMIDE 40 MG PO TABS: 40.0000 mg | ORAL_TABLET | Freq: Two times a day (BID) | ORAL | 3 refills | Status: DC

## 2022-07-22 MED ORDER — MAGNESIUM OXIDE -MG SUPPLEMENT 400 (240 MG) MG PO TABS: 1.0000 | ORAL_TABLET | Freq: Every day | ORAL | 0 refills | Status: DC

## 2022-07-22 MED ORDER — SPIRONOLACTONE 25 MG PO TABS: 25.0000 mg | ORAL_TABLET | Freq: Every day | ORAL | 0 refills | Status: DC

## 2022-07-22 MED ORDER — HYDRALAZINE HCL 25 MG PO TABS: ORAL_TABLET | ORAL | 3 refills | Status: DC

## 2022-07-22 MED ORDER — ENTRESTO 49-51 MG PO TABS: 1.0000 | ORAL_TABLET | Freq: Two times a day (BID) | ORAL | 0 refills | Status: DC

## 2022-07-24 ENCOUNTER — Telehealth: Payer: Self-pay | Admitting: Internal Medicine

## 2022-07-24 ENCOUNTER — Other Ambulatory Visit: Payer: Self-pay | Admitting: *Deleted

## 2022-07-24 MED ORDER — METOPROLOL SUCCINATE ER 25 MG PO TB24: ORAL_TABLET | ORAL | 3 refills | Status: DC

## 2022-07-24 NOTE — Telephone Encounter (Signed)
*  STAT* If patient is at the pharmacy, call can be transferred to refill team.   1. Which medications need to be refilled? (please list name of each medication and dose if known) Metoprolol Succinate,   2. Which pharmacy/location (including street and city if local pharmacy) is medication to be sent to?[address],   [place]  3. Do they need a 30 day or 90 day supply? 90 days and refills- she wants to know if you received the electronic refill on this medicine? She is trying to see if she is having trouble sending refills electronically

## 2022-07-24 NOTE — Telephone Encounter (Signed)
Rx has been sent in as requested. I called [place] know that we did not receive the electronic refill request.

## 2022-08-12 ENCOUNTER — Other Ambulatory Visit: Payer: Self-pay

## 2022-08-12 MED ORDER — POTASSIUM CHLORIDE CRYS ER 20 MEQ PO TBCR: 20.0000 meq | EXTENDED_RELEASE_TABLET | Freq: Every day | ORAL | 11 refills | Status: DC

## 2022-08-19 ENCOUNTER — Telehealth: Payer: Self-pay | Admitting: Internal Medicine

## 2022-08-19 NOTE — Telephone Encounter (Signed)
Sent an email to [employer] at [place] to investigate. Will await response.

## 2022-08-19 NOTE — Telephone Encounter (Signed)
Received email response from [facility] as follows:  I am not sure why, but [employer] has it listed as "package was refused by the receiver and will be returned to the sender." I just shipped a new monitor to the patient and will keep an eye on it to make sure it is delivered.  Called patient to inform her of this and received a message stating that a voicemail box has not been set up yet.

## 2022-08-19 NOTE — Telephone Encounter (Signed)
Patient stated a heart monitor was ordered for her but she never received the equipment.  Patient is following up on the status of the heart monitor.

## 2022-08-20 NOTE — Telephone Encounter (Signed)
Called and spoke with patient. She stated that she never received a package that she refused. I confirmed her address as what we do have in our system:  [place]  [place] [id number]  Patient was grateful for the follow up and will keep her eye out for the monitor.

## 2022-08-21 ENCOUNTER — Other Ambulatory Visit: Payer: Self-pay

## 2022-08-21 DIAGNOSIS — I5022 Chronic systolic (congestive) heart failure: Secondary | ICD-10-CM

## 2022-08-21 MED ORDER — SPIRONOLACTONE 25 MG PO TABS: 25.0000 mg | ORAL_TABLET | Freq: Every day | ORAL | 3 refills | Status: DC

## 2022-08-21 MED ORDER — MAGNESIUM OXIDE -MG SUPPLEMENT 400 (240 MG) MG PO TABS: 1.0000 | ORAL_TABLET | Freq: Every day | ORAL | 2 refills | Status: DC

## 2022-08-23 DIAGNOSIS — I493 Ventricular premature depolarization: Secondary | ICD-10-CM | POA: Diagnosis not present

## 2022-08-24 ENCOUNTER — Other Ambulatory Visit: Payer: Self-pay | Admitting: Internal Medicine

## 2022-09-03 DIAGNOSIS — I493 Ventricular premature depolarization: Secondary | ICD-10-CM | POA: Diagnosis not present

## 2022-09-05 NOTE — Progress Notes (Unsigned)
Cardiology Office Note    Date:  09/10/2022   ID:  Lauren Banks, DOB 1966/06/20, MRN [id number]  PCP:  Renee Rival, NP  Cardiologist:  Nelva Bush, MD  Electrophysiologist:  Vickie Epley, MD   Chief Complaint: Follow-up  History of Present Illness:   Lauren Banks is a 56 y.o. female with history of HFrEF secondary to NICM with subsequent normalization of LV systolic function by cardiac MRI in 07/2021, frequent PVCs, HTN, HLD, and tobacco use who presents for follow-up of her cardiomyopathy and PVCs.   She was evaluated by Dr. Saunders Revel as a new patient on 10/02/2020 at the request of her pulmonologist for evaluation of PVCs.  Echo on 10/23/2020 which showed an EF of 35 to 40%, global hypokinesis, grade 2 diastolic dysfunction, mildly reduced RV systolic function with a mildly enlarged RV cavity size, mild to moderate mitral regurgitation, mild to moderate aortic valve insufficiency with moderate aortic valve sclerosis with no evidence of aortic valve stenosis, and a right atrial pressure of 15 mmHg.  R/LHC in 11/2020 showed no angiographically significant CAD with severely elevated left heart, right heart, and pulmonary arterial pressures.  Severely reduced cardiac output/index in the context of markedly elevated systemic blood pressure.  She was admitted for diuresis and optimization of medical therapy.  Repeat limited echo in 01/2021 demonstrated an EF of 45 to 50%, no regional wall motion abnormalities, low normal RV systolic function with normal ventricular cavity size, trivial mitral regurgitation, and trivial aortic insufficiency.  Subsequent event monitoring showed a 12% PVC burden.  She was referred to EP with cardiac MRI showing normal biventricular systolic function and mild aortic regurgitation.  No etiology for PVCs was identified.   She was seen in the office in 12/2021 and had put on 25 to 30 pounds over the preceding 6 months, which she attributed to being more sedentary.  She  did note a several week history of mild ankle swelling.  She was euvolemic with weight gain felt to be related to dietary indiscretion.  She was in ventricular bigeminy on EKG with recommendation to continue beta-blocker under the direction of EP.  She was last seen in the office in 07/2022 and was without symptoms of angina or decompensation.  Her weight was down 4 pounds when compared to last clinic visit.  She remained in ventricular bigeminy.  Relative hypotension precluded escalation of Toprol-XL.  Subsequent 3-day Zio patch showed a predominant rhythm of sinus with rare PACs and frequent PVCs, though PVC burden had decreased significantly from prior study with a trend of 12% to less than 6%.  She comes in doing well from a cardiac perspective and is without symptoms of angina or decompensation.  No dizziness, presyncope, or syncope.  She does note an occasional palpitation.  Weight stable.  Would like to come off potassium if possible.  No significant lower extremity swelling.   Labs independently reviewed: 12/2021 - TSH normal, magnesium 1.9, BUN 13, serum creatinine 1.1, potassium 4.6, albumin 4.5, AST/ALT normal, Hgb 13.8, PLT 339 10/2020 - direct LDL 164, TC 231, TG 262, HDL 29  Past Medical History:  Diagnosis Date   Chronic combined systolic (congestive) and diastolic (congestive) heart failure (HCC)    Frequent PVCs    Hyperlipemia    Hypertension    Nonischemic cardiomyopathy [facility])     Past Surgical History:  Procedure Laterality Date   ABDOMINAL HYSTERECTOMY  10/13/2011   Procedure: HYSTERECTOMY ABDOMINAL;  Surgeon: Jonnie Kind, MD;  Location: [facility];  Service: Gynecology;  Laterality: N/A;   CARDIAC CATHETERIZATION     RIGHT/LEFT HEART CATH AND CORONARY ANGIOGRAPHY N/A 11/19/2020   Procedure: RIGHT/LEFT HEART CATH AND CORONARY ANGIOGRAPHY;  Surgeon: Nelva Bush, MD;  Location: [facility];  Service: Cardiovascular;  Laterality: N/A;    Current  Medications: Current Meds  Medication Sig   albuterol (VENTOLIN HFA) 108 (90 Base) MCG/ACT inhaler Inhale 2 puffs into the lungs every 4 (four) hours as needed for wheezing or shortness of breath.   buPROPion (WELLBUTRIN XL) 300 MG 24 hr tablet Take 300 mg by mouth daily.   ENTRESTO 49-51 MG TAKE ONE TABLET BY MOUTH TWICE DAILY   escitalopram (LEXAPRO) 20 MG tablet Take 20 mg by mouth daily.   famotidine (PEPCID) 20 MG tablet TAKE 1 TABLET BY MOUTH ONCE DAILY.   fluticasone (FLONASE) 50 MCG/ACT nasal spray Place 1 spray into both nostrils daily as needed for allergies or rhinitis.   hydrALAZINE (APRESOLINE) 10 MG tablet Take 1 tablet (10 mg total) by mouth 3 (three) times daily.   ipratropium-albuterol (DUONEB) 0.5-2.5 (3) MG/3ML SOLN Inhale 3 mLs into the lungs every 4 (four) hours as needed (Asthma).   isosorbide dinitrate (ISORDIL) 10 MG tablet Take 1 tablet (10 mg total) by mouth 3 (three) times daily.   magnesium oxide (MAG-OX) 400 (240 Mg) MG tablet Take 1 tablet (400 mg total) by mouth daily.   pantoprazole (PROTONIX) 40 MG tablet Take 1 tablet (40 mg total) by mouth daily. Take 30-60 min before first meal of the day   spironolactone (ALDACTONE) 25 MG tablet Take 1 tablet (25 mg total) by mouth daily.   venlafaxine XR (EFFEXOR-XR) 37.5 MG 24 hr capsule Take 37.5 mg by mouth daily.   [DISCONTINUED] furosemide (LASIX) 40 MG tablet Take 1 tablet (40 mg total) by mouth 2 (two) times daily.   [DISCONTINUED] hydrALAZINE (APRESOLINE) 25 MG tablet TAKE (1) TABLET BY MOUTH THREE TIMES DAILY   [DISCONTINUED] metoprolol succinate (TOPROL-XL) 25 MG 24 hr tablet TAKE 1 TABLET BY MOUTH IN THE MORNING AND AT BEDTIME   [DISCONTINUED] potassium chloride SA (KLOR-CON M) 20 MEQ tablet Take 1 tablet (20 mEq total) by mouth daily.    Allergies:   Penicillin g and Penicillins   Social History   Socioeconomic History   Marital status: Married    Spouse name: Not on file   Number of children: Not on file    Years of education: Not on file   Highest education level: Not on file  Occupational History   Not on file  Tobacco Use   Smoking status: Every Day    Packs/day: 0.25    Years: 30.00    Total pack years: 7.50    Types: Cigarettes   Smokeless tobacco: Never   Tobacco comments:    2 packs a week.  Vaping Use   Vaping Use: Former  Substance and Sexual Activity   Alcohol use: No   Drug use: Yes    Frequency: 7.0 times per week    Types: Marijuana   Sexual activity: Yes    Birth control/protection: None, Surgical  Other Topics Concern   Not on file  Social History Narrative   Not on file   Social Determinants of Health   Financial Resource Strain: Not on file  Food Insecurity: Not on file  Transportation Needs: Not on file  Physical Activity: Not on file  Stress: Not on file  Social Connections: Not on file  Family History:  The patient's family history includes Alzheimer's disease in her mother; Cancer in her brother, maternal aunt, and mother; Diabetes in her father and sister; Gout in her brother and father; Heart disease in her sister; Hypertension in her brother, father, and mother; Kidney disease in her father and maternal grandmother; Obesity in her sister; Stroke in her maternal grandmother and paternal grandmother. There is no history of Anesthesia problems.  ROS:   12-point review of systems is negative unless otherwise noted in the HPI.   EKGs/Labs/Other Studies Reviewed:    Studies reviewed were summarized above. The additional studies were reviewed today:  2D echo 10/23/2020:  1. Left ventricular ejection fraction, by estimation, is 35 to 40%. Left  ventricular ejection fraction by 3D volume is 43 %. The left ventricle has  moderately decreased function. The left ventricle demonstrates global  hypokinesis. Left ventricular  diastolic parameters are consistent with Grade II diastolic dysfunction  (pseudonormalization). The average left ventricular  global longitudinal  strain is -10.2 %. The global longitudinal strain is abnormal.   2. Right ventricular systolic function is mildly reduced. The right  ventricular size is mildly enlarged.   3. The mitral valve is normal in structure. Mild to moderate mitral valve  regurgitation.   4. The aortic valve was not well visualized. Aortic valve regurgitation  is mild to moderate. Mild aortic valve sclerosis is present, with no  evidence of aortic valve stenosis.   5. The inferior vena cava is dilated in size with <50% respiratory  variability, suggesting right atrial pressure of 15 mmHg. __________   [facility] 11/19/2020: Conclusions: No angiographically significant coronary artery disease consistent with nonischemic cardiomyopathy. Severely elevated left heart, right heart, and pulmonary artery pressures. Severely reduced Fick cardiac output/index in the setting of markedly elevated systemic blood pressure.   Recommendations: Admit for diuresis and optimization of goal-directed medical therapy. Consider advanced heart failure consultation, ideally as an outpatient but potentially as transfer to [facility] if patient fails to respond to inotrope therapy and escalation of evidence-based heart failure agents. __________   Limited echo 02/05/2021: 1. Left ventricular ejection fraction, by estimation, is 45 to 50%. The  left ventricle has mildly decreased function. The left ventricle has no  regional wall motion abnormalities.   2. Right ventricular systolic function is low normal. The right  ventricular size is normal.   3. The mitral valve is normal in structure. Trivial mitral valve  regurgitation.   4. The aortic valve is tricuspid. Aortic valve regurgitation is trivial. __________   Elwyn Reach patch 05/2021:The patient was monitored for 2 days, 10 hours. The predominant rhythm was sinus with an average rate of 74 bpm (range 57-114 bpm). There were rare PACs and frequent PVCs (12.2% PVC  burden). No sustained arrhythmia or prolonged pause was observed. Patient triggered/diary events correspond to sinus rhythm.   Predominantly sinus rhythm with frequent PVCs (12% burden). __________   Cardiac MRI 07/14/2021: IMPRESSION: 1. Normal LV size and thickness. low normal LV function, LVEF 53%. 2. Normal RV size and thickness, mildly reduced RV systolic function RVEF 62%. 3. There is no evidence for late gadolinium enhancement or scar. 4. Mild aortic regurgitation. 5. No findings to suggest etiology for PVC's. __________  Elwyn Reach patch 07/2022:   The patient was monitored for 2 days, 11 hours.   The predominant rhythm was sinus with an average rate of 80 bpm (range 57-123 bpm in sinus).   There were rare PACs and frequent PVCs (PVC burden  5.6%).   Sustained arrhythmia or prolonged pause was observed.   Single patient triggered event corresponds to sinus rhythm with artifact.   Predominant sinus with rare PACs and frequent PVCs.  PVC burden has decreased significantly from prior monitor in 05/2021 (12% -> 6%).   EKG:  EKG is declined by patient today.    Recent Labs: 12/05/2021: ALT 13; TSH 1.550 09/10/2022: BUN 13; Creatinine, Ser 1.21; Magnesium 2.0; Potassium 4.2; Sodium 138  Recent Lipid Panel    Component Value Date/Time   CHOL 231 (H) 10/30/2020 1439   TRIG 262 (H) 10/30/2020 1439   HDL 29 (L) 10/30/2020 1439   CHOLHDL 8.0 (H) 10/30/2020 1439   LDLCALC 153 (H) 10/30/2020 1439   LDLDIRECT 164 (H) 10/30/2020 1439    PHYSICAL EXAM:    VS:  BP 120/78 (BP Location: Left Arm, Patient Position: Sitting, Cuff Size: Large)   Pulse 75   Ht '5\' 10"'$  (1.778 m)   Wt 268 lb 6 oz (121.7 kg)   LMP 09/21/2011 Comment: partial  SpO2 98%   BMI 38.51 kg/m   BMI: Body mass index is 38.51 kg/m.  Physical Exam Vitals reviewed.  Constitutional:      Appearance: She is well-developed.  HENT:     Head: Normocephalic and atraumatic.  Eyes:     General:        Right eye: No  discharge.        Left eye: No discharge.  Neck:     Vascular: No JVD.  Cardiovascular:     Rate and Rhythm: Normal rate and regular rhythm.     Pulses:          Dorsalis pedis pulses are 2+ on the right side and 2+ on the left side.       Posterior tibial pulses are 2+ on the right side and 2+ on the left side.     Heart sounds: Normal heart sounds, S1 normal and S2 normal. Heart sounds not distant. No midsystolic click and no opening snap. No murmur heard.    No friction rub.  Pulmonary:     Effort: Pulmonary effort is normal. No respiratory distress.     Breath sounds: Normal breath sounds. No decreased breath sounds, wheezing or rales.  Chest:     Chest wall: No tenderness.  Abdominal:     General: There is no distension.  Musculoskeletal:     Cervical back: Normal range of motion.     Right lower leg: No edema.     Left lower leg: No edema.  Skin:    General: Skin is warm and dry.     Nails: There is no clubbing.  Neurological:     Mental Status: She is alert and oriented to person, place, and time.  Psychiatric:        Speech: Speech normal.        Behavior: Behavior normal.        Thought Content: Thought content normal.        Judgment: Judgment normal.     Wt Readings from Last 3 Encounters:  09/10/22 268 lb 6 oz (121.7 kg)  07/14/22 268 lb 9.6 oz (121.8 kg)  12/05/21 272 lb 4 oz (123.5 kg)     ASSESSMENT & PLAN:   HFrEF secondary to NICM: She appears euvolemic and well compensated with NYHA class I symptoms.  We will decrease hydralazine to 10 mg 3 times daily to allow for escalation of Toprol-XL as outlined below.  Otherwise, continue current doses of Entresto, isosorbide dinitrate, spironolactone, and furosemide.  Most recent echo showed an EF of 45 to 50%.  Defer addition of SGLT2 inhibitor secondary to relative hypotension and plans to escalate beta-blocker to continue to improve upon PVC burden.  Frequent PVCs: Repeat outpatient cardiac monitoring showed an  improved PVC burden of 12% to 5.6%.  Titrate Toprol-XL to 37.5 mg twice daily.  Check BMP and magnesium.  HLD/hypertriglyceridemia: LDL 164 with a triglyceride of 262 in 10/2020.  Continue lifestyle modification and ongoing surveillance per PCP.  Renal insufficiency: Check a BMP.    Disposition: F/u with Dr. Saunders Revel or an APP in 3 months.   Medication Adjustments/Labs and Tests Ordered: Current medicines are reviewed at length with the patient today.  Concerns regarding medicines are outlined above. Medication changes, Labs and Tests ordered today are summarized above and listed in the Patient Instructions accessible in Encounters.   Signed, Christell Faith, PA-C 09/10/2022 3:29 PM     [address] [place], [place] [id number] [phone]

## 2022-09-10 ENCOUNTER — Other Ambulatory Visit
Admission: RE | Admit: 2022-09-10 | Discharge: 2022-09-10 | Disposition: A | Discharge status: Home / Self Care | Payer: BC Managed Care – PPO | Attending: Physician Assistant | Admitting: Physician Assistant

## 2022-09-10 ENCOUNTER — Other Ambulatory Visit: Payer: Self-pay | Admitting: *Deleted

## 2022-09-10 ENCOUNTER — Encounter: Payer: Self-pay | Admitting: Physician Assistant

## 2022-09-10 ENCOUNTER — Ambulatory Visit: Payer: BC Managed Care – PPO | Attending: Physician Assistant | Admitting: Physician Assistant

## 2022-09-10 VITALS — BP 120/78 | HR 75 | Ht 70.0 in | Wt 268.4 lb

## 2022-09-10 DIAGNOSIS — I5022 Chronic systolic (congestive) heart failure: Secondary | ICD-10-CM | POA: Insufficient documentation

## 2022-09-10 DIAGNOSIS — I493 Ventricular premature depolarization: Secondary | ICD-10-CM

## 2022-09-10 DIAGNOSIS — I428 Other cardiomyopathies: Secondary | ICD-10-CM | POA: Diagnosis not present

## 2022-09-10 DIAGNOSIS — I5023 Acute on chronic systolic (congestive) heart failure: Secondary | ICD-10-CM

## 2022-09-10 DIAGNOSIS — Z79899 Other long term (current) drug therapy: Secondary | ICD-10-CM

## 2022-09-10 DIAGNOSIS — E782 Mixed hyperlipidemia: Secondary | ICD-10-CM | POA: Diagnosis not present

## 2022-09-10 DIAGNOSIS — N289 Disorder of kidney and ureter, unspecified: Secondary | ICD-10-CM | POA: Diagnosis not present

## 2022-09-10 DIAGNOSIS — E781 Pure hyperglyceridemia: Secondary | ICD-10-CM | POA: Diagnosis not present

## 2022-09-10 LAB — BASIC METABOLIC PANEL
Anion gap: 8 (ref 5–15)
BUN: 13 mg/dL (ref 6–20)
CO2: 26 mmol/L (ref 22–32)
Calcium: 9.7 mg/dL (ref 8.9–10.3)
Chloride: 104 mmol/L (ref 98–111)
Creatinine, Ser: 1.21 mg/dL — ABNORMAL HIGH (ref 0.44–1.00)
GFR, Estimated: 52 mL/min — ABNORMAL LOW (ref >60)
Glucose, Bld: 93 mg/dL (ref 70–99)
Potassium: 4.2 mmol/L (ref 3.5–5.1)
Sodium: 138 mmol/L (ref 135–145)

## 2022-09-10 LAB — MAGNESIUM: Magnesium: 2 mg/dL (ref 1.7–2.4)

## 2022-09-10 MED ORDER — HYDRALAZINE HCL 10 MG PO TABS: 10.0000 mg | ORAL_TABLET | Freq: Three times a day (TID) | ORAL | 3 refills | Status: DC

## 2022-09-10 MED ORDER — METOPROLOL SUCCINATE ER 25 MG PO TB24: 37.5000 mg | ORAL_TABLET | Freq: Two times a day (BID) | ORAL | 3 refills | Status: DC

## 2022-09-10 MED ORDER — FUROSEMIDE 40 MG PO TABS: 40.0000 mg | ORAL_TABLET | Freq: Every day | ORAL | 3 refills | Status: DC

## 2022-09-10 NOTE — Patient Instructions (Signed)
Medication Instructions:  Your physician has recommended you make the following change in your medication:   DECREASE Hydralazine to 10 mg three times a day  INCREASE Toprol XL (metoprolol succinate) to 37.5 mg twice a day  *If you need a refill on your cardiac medications before your next appointment, please call your pharmacy*   Lab Work: [place] today over at the [facility]. Please stop at registration desk.   If you have labs (blood work) drawn today and your tests are completely normal, you will receive your results only by: [place] (if you have MyChart) OR A paper copy in the mail If you have any lab test that is abnormal or we need to change your treatment, we will call you to review the results.   Testing/Procedures: None   Follow-Up: At [facility], you and your health needs are our priority.  As part of our continuing mission to provide you with exceptional heart care, we have created designated Provider Care Teams.  These Care Teams include your primary Cardiologist (physician) and Advanced Practice Providers (APPs -  Physician Assistants and Nurse Practitioners) who all work together to provide you with the care you need, when you need it.  We recommend signing up for the patient portal called "MyChart".  Sign up information is provided on this After Visit Summary.  MyChart is used to connect with patients for Virtual Visits (Telemedicine).  Patients are able to view lab/test results, encounter notes, upcoming appointments, etc.  Non-urgent messages can be sent to your provider as well.   To learn more about what you can do with MyChart, go to [email or web address].    Your next appointment:   3 month(s)  The format for your next appointment:   In Person  Provider:   Nelva Bush, MD or Christell Faith, PA-C        Important Information About Sugar

## 2022-09-14 ENCOUNTER — Other Ambulatory Visit: Payer: Self-pay

## 2022-09-14 MED ORDER — ENTRESTO 49-51 MG PO TABS: 1.0000 | ORAL_TABLET | Freq: Two times a day (BID) | ORAL | 2 refills | Status: DC

## 2022-09-17 ENCOUNTER — Other Ambulatory Visit: Payer: Self-pay | Admitting: *Deleted

## 2022-09-17 MED ORDER — METOPROLOL SUCCINATE ER 50 MG PO TB24: 50.0000 mg | ORAL_TABLET | Freq: Two times a day (BID) | ORAL | 3 refills | Status: DC

## 2022-10-13 DIAGNOSIS — R7303 Prediabetes: Secondary | ICD-10-CM | POA: Diagnosis not present

## 2022-10-13 DIAGNOSIS — E559 Vitamin D deficiency, unspecified: Secondary | ICD-10-CM | POA: Diagnosis not present

## 2022-10-13 DIAGNOSIS — Z79899 Other long term (current) drug therapy: Secondary | ICD-10-CM | POA: Diagnosis not present

## 2022-10-13 DIAGNOSIS — E782 Mixed hyperlipidemia: Secondary | ICD-10-CM | POA: Diagnosis not present

## 2022-10-20 DIAGNOSIS — R609 Edema, unspecified: Secondary | ICD-10-CM | POA: Diagnosis not present

## 2022-10-20 DIAGNOSIS — E782 Mixed hyperlipidemia: Secondary | ICD-10-CM | POA: Diagnosis not present

## 2022-10-20 DIAGNOSIS — F419 Anxiety disorder, unspecified: Secondary | ICD-10-CM | POA: Diagnosis not present

## 2022-10-20 DIAGNOSIS — D72829 Elevated white blood cell count, unspecified: Secondary | ICD-10-CM | POA: Diagnosis not present

## 2022-11-26 ENCOUNTER — Other Ambulatory Visit
Admission: RE | Admit: 2022-11-26 | Discharge: 2022-11-26 | Disposition: A | Discharge status: Home / Self Care | Payer: BC Managed Care – PPO | Attending: Physician Assistant | Admitting: Physician Assistant

## 2022-11-26 DIAGNOSIS — Z79899 Other long term (current) drug therapy: Secondary | ICD-10-CM | POA: Diagnosis not present

## 2022-11-26 DIAGNOSIS — I5023 Acute on chronic systolic (congestive) heart failure: Secondary | ICD-10-CM | POA: Insufficient documentation

## 2022-11-26 DIAGNOSIS — I493 Ventricular premature depolarization: Secondary | ICD-10-CM | POA: Diagnosis not present

## 2022-11-26 DIAGNOSIS — I428 Other cardiomyopathies: Secondary | ICD-10-CM | POA: Diagnosis not present

## 2022-11-26 DIAGNOSIS — I5022 Chronic systolic (congestive) heart failure: Secondary | ICD-10-CM | POA: Diagnosis not present

## 2022-11-26 LAB — BASIC METABOLIC PANEL
Anion gap: 5 (ref 5–15)
BUN: 14 mg/dL (ref 6–20)
CO2: 28 mmol/L (ref 22–32)
Calcium: 9.3 mg/dL (ref 8.9–10.3)
Chloride: 104 mmol/L (ref 98–111)
Creatinine, Ser: 1.11 mg/dL — ABNORMAL HIGH (ref 0.44–1.00)
GFR, Estimated: 58 mL/min — ABNORMAL LOW (ref >60)
Glucose, Bld: 91 mg/dL (ref 70–99)
Potassium: 3.8 mmol/L (ref 3.5–5.1)
Sodium: 137 mmol/L (ref 135–145)

## 2022-11-30 ENCOUNTER — Other Ambulatory Visit: Payer: Self-pay | Admitting: Internal Medicine

## 2022-12-04 ENCOUNTER — Encounter: Payer: Self-pay | Admitting: Internal Medicine

## 2022-12-04 ENCOUNTER — Ambulatory Visit: Payer: BC Managed Care – PPO | Attending: Internal Medicine | Admitting: Internal Medicine

## 2022-12-04 VITALS — BP 116/86 | HR 74 | Ht 70.0 in | Wt 266.0 lb

## 2022-12-04 DIAGNOSIS — I493 Ventricular premature depolarization: Secondary | ICD-10-CM | POA: Diagnosis not present

## 2022-12-04 DIAGNOSIS — I5022 Chronic systolic (congestive) heart failure: Secondary | ICD-10-CM | POA: Diagnosis not present

## 2022-12-04 DIAGNOSIS — I428 Other cardiomyopathies: Secondary | ICD-10-CM

## 2022-12-04 DIAGNOSIS — E782 Mixed hyperlipidemia: Secondary | ICD-10-CM

## 2022-12-04 DIAGNOSIS — I10 Essential (primary) hypertension: Secondary | ICD-10-CM

## 2022-12-04 NOTE — Patient Instructions (Signed)
Medication Instructions:  Your Physician recommend you continue on your current medication as directed.    *If you need a refill on your cardiac medications before your next appointment, please call your pharmacy*   Lab Work: None ordered today   Testing/Procedures: None ordered today   Follow-Up: At [facility], you and your health needs are our priority.  As part of our continuing mission to provide you with exceptional heart care, we have created designated Provider Care Teams.  These Care Teams include your primary Cardiologist (physician) and Advanced Practice Providers (APPs -  Physician Assistants and Nurse Practitioners) who all work together to provide you with the care you need, when you need it.  We recommend signing up for the patient portal called "MyChart".  Sign up information is provided on this After Visit Summary.  MyChart is used to connect with patients for Virtual Visits (Telemedicine).  Patients are able to view lab/test results, encounter notes, upcoming appointments, etc.  Non-urgent messages can be sent to your provider as well.   To learn more about what you can do with MyChart, go to [email or web address].    Your next appointment:   6 month(s)  Provider:   You may see Christopher End, MD or one of the following Advanced Practice Providers on your designated Care Team:   Christopher Berge, NP Ryan Dunn, PA-C Cadence Furth, PA-C Sheri Hammock, NP     

## 2022-12-04 NOTE — Progress Notes (Signed)
Follow-up Outpatient Visit Date: 12/04/2022  Primary Care Provider: Renee Rival, NP [address] [place] [address]  Chief Complaint: Follow-up heart failure  HPI:  Ms. Lauren Banks is a 57 y.o. female with history of chronic HFrEF due to nonischemic cardiomyopathy, frequent PVCs, hypertension, hyperlipidemia, and tobacco use, who presents for follow-up of heart failure and PVCs.  She was last seen in our office in September by Christell Faith, PA, at which time she was doing well other than occasional dizziness without frank syncope.  Due to ventricular bigeminy on EKG at that visit, event monitor was placed.  This showed a 5.6% PVC burden, decreased from 12% in 05/2021.  Prior cardiac MRI in 07/2021 showed normalization of LV systolic function with a EF of 53% (previously as low as 35-40% in 10/2020).  Today, Ms. Lauren Banks reports that she has been feeling quite well.  Her only complaint is of continued dizziness that she describes as if hearing something.  She has not passed out or fallen.  She reports occasional palpitations but is unsure if it is actually anxiety that she is feeling.  This seems to have gotten better since her last visit in November.  She denies chest pain, shortness of breath, and edema.  She notes some dry mouth as well as dry skin and dry hair.  She is tolerating her medications well without obvious side effects.  --------------------------------------------------------------------------------------------------  Past Medical History:  Diagnosis Date   Chronic combined systolic (congestive) and diastolic (congestive) heart failure (HCC)    Frequent PVCs    Hyperlipemia    Hypertension    Nonischemic cardiomyopathy [facility])    Past Surgical History:  Procedure Laterality Date   ABDOMINAL HYSTERECTOMY  10/13/2011   Procedure: HYSTERECTOMY ABDOMINAL;  Surgeon: Jonnie Kind, MD;  Location: [facility];  Service: Gynecology;  Laterality: N/A;   CARDIAC CATHETERIZATION     RIGHT/LEFT  HEART CATH AND CORONARY ANGIOGRAPHY N/A 11/19/2020   Procedure: RIGHT/LEFT HEART CATH AND CORONARY ANGIOGRAPHY;  Surgeon: Nelva Bush, MD;  Location: [facility];  Service: Cardiovascular;  Laterality: N/A;    Current Meds  Medication Sig   albuterol (VENTOLIN HFA) 108 (90 Base) MCG/ACT inhaler Inhale 2 puffs into the lungs every 4 (four) hours as needed for wheezing or shortness of breath.   buPROPion (WELLBUTRIN XL) 300 MG 24 hr tablet Take 300 mg by mouth daily.   famotidine (PEPCID) 20 MG tablet TAKE 1 TABLET BY MOUTH ONCE DAILY.   fluticasone (FLONASE) 50 MCG/ACT nasal spray Place 1 spray into both nostrils daily as needed for allergies or rhinitis.   furosemide (LASIX) 40 MG tablet Take 1 tablet (40 mg total) by mouth daily.   hydrALAZINE (APRESOLINE) 10 MG tablet Take 1 tablet (10 mg total) by mouth 3 (three) times daily.   ipratropium-albuterol (DUONEB) 0.5-2.5 (3) MG/3ML SOLN Inhale 3 mLs into the lungs every 4 (four) hours as needed (Asthma).   isosorbide dinitrate (ISORDIL) 10 MG tablet Take 1 tablet (10 mg total) by mouth 3 (three) times daily.   magnesium oxide (MAG-OX) 400 (240 Mg) MG tablet Take 1 tablet (400 mg total) by mouth daily.   metoprolol succinate (TOPROL-XL) 50 MG 24 hr tablet Take 1 tablet (50 mg total) by mouth 2 (two) times daily. Take with or immediately following a meal.   pantoprazole (PROTONIX) 40 MG tablet Take 1 tablet (40 mg total) by mouth daily. Take 30-60 min before first meal of the day   sacubitril-valsartan (ENTRESTO) 49-51  MG TAKE ONE TABLET BY MOUTH TWICE DAILY   spironolactone (ALDACTONE) 25 MG tablet Take 1 tablet (25 mg total) by mouth daily.   venlafaxine XR (EFFEXOR-XR) 37.5 MG 24 hr capsule Take 37.5 mg by mouth daily.   Vitamin D, Ergocalciferol, (DRISDOL) 1.25 MG (50000 UNIT) CAPS capsule Take 50,000 Units by mouth daily.    Allergies: Penicillin g and Penicillins  Social History   Tobacco Use   Smoking status: Every Day     Packs/day: 0.25    Years: 30.00    Total pack years: 7.50    Types: Cigarettes   Smokeless tobacco: Never   Tobacco comments:    2 packs a week.  Vaping Use   Vaping Use: Former  Substance Use Topics   Alcohol use: No   Drug use: Yes    Frequency: 7.0 times per week    Types: Marijuana    Family History  Problem Relation Age of Onset   Cancer Mother        breast- around age 56, ovarian- around age38   Hypertension Mother    Alzheimer's disease Mother    Hypertension Father    Diabetes Father    Kidney disease Father    Gout Father    Stroke Maternal Grandmother    Kidney disease Maternal Grandmother    Stroke Paternal Grandmother    Diabetes Sister    Obesity Sister    Heart disease Sister    Hypertension Brother    Cancer Brother        lymphnode   Gout Brother    Cancer Maternal Aunt    Anesthesia problems Neg Hx     Review of Systems: A 12-system review of systems was performed and was negative except as noted in the HPI.  --------------------------------------------------------------------------------------------------  Physical Exam: BP 116/86 (BP Location: Left Arm, Patient Position: Sitting, Cuff Size: Large)   Pulse 74   Ht '5\' 10"'$  (1.778 m)   Wt 266 lb (120.7 kg)   LMP 09/21/2011 Comment: partial  SpO2 99%   BMI 38.17 kg/m   General:  NAD. Neck: No JVD or HJR. Lungs: Clear to auscultation bilaterally without wheezes or crackles. Heart: Regular rate and rhythm without murmurs, rubs, or gallops. Abdomen: Soft, nontender, nondistended. Extremities: No lower extremity edema.   Lab Results  Component Value Date   WBC 8.2 07/10/2021   HGB 13.8 07/10/2021   HCT 42.9 07/10/2021   MCV 86.5 07/10/2021   PLT 339 07/10/2021    Lab Results  Component Value Date   NA 137 11/26/2022   K 3.8 11/26/2022   CL 104 11/26/2022   CO2 28 11/26/2022   BUN 14 11/26/2022   CREATININE 1.11 (H) 11/26/2022   GLUCOSE 91 11/26/2022   ALT 13 12/05/2021     Lab Results  Component Value Date   CHOL 231 (H) 10/30/2020   HDL 29 (L) 10/30/2020   LDLCALC 153 (H) 10/30/2020   LDLDIRECT 164 (H) 10/30/2020   TRIG 262 (H) 10/30/2020   CHOLHDL 8.0 (H) 10/30/2020    --------------------------------------------------------------------------------------------------  ASSESSMENT AND PLAN: Chronic HFrEF with recovered ejection fraction due to nonischemic cardiomyopathy: Lauren Banks appears euvolemic with NYHA class I symptoms.  Given her continued dizziness and low normal systolic blood pressure today, we discussed this de-escalation of her GDMT.  However, she otherwise is feeling fairly well, she wishes to defer medication changes and work on lifestyle modifications, including sodium restriction and tobacco cessation.  PVCs: PVC still frequent on event  monitor last year, though burden had decreased from 12 to 5%.  Continue current dose of metoprolol.  Hypertension: Blood pressure reasonably well-controlled today.  Defer medication changes at this time.  Hyperlipidemia: Lipids moderately elevated on last check in 2021.  Ongoing surveillance/management per PCP.  Follow-up: Return to clinic in 6 months.  Nelva Bush, MD 12/04/2022 8:20 AM

## 2022-12-11 ENCOUNTER — Ambulatory Visit: Payer: BC Managed Care – PPO | Admitting: Internal Medicine

## 2022-12-24 ENCOUNTER — Telehealth: Payer: Self-pay

## 2022-12-24 DIAGNOSIS — Z79899 Other long term (current) drug therapy: Secondary | ICD-10-CM

## 2022-12-24 MED ORDER — ROSUVASTATIN CALCIUM 5 MG PO TABS: 5.0000 mg | ORAL_TABLET | Freq: Every day | ORAL | 3 refills | Status: DC

## 2022-12-24 NOTE — Telephone Encounter (Signed)
Pt made aware of MD's recommendations Pt agreeable to plan Rosuvastatin 5 mg daily sent to requested pharmacy and repeat lab order placed

## 2022-12-24 NOTE — Telephone Encounter (Signed)
-----   Message from Nelva Bush, MD sent at 12/24/2022  2:08 PM EST ----- Regarding: Abnormal labs Hi Lauren Banks,  Could you let Ms. Czapla know that I have reviewed her labs from 10/2022, which were notable for severely elevated LDL at 192.  Though she has had issues with atorvastatin in the past, I would encourage her to consider restarting a statin.  We recommend rosuvastatin 5 mg daily, with repeat lipid panel and ALT in about 3 months.  Gerald Stabs

## 2022-12-28 ENCOUNTER — Other Ambulatory Visit: Payer: Self-pay | Admitting: Physician Assistant

## 2023-01-06 ENCOUNTER — Other Ambulatory Visit: Payer: Self-pay | Admitting: Internal Medicine

## 2023-02-01 ENCOUNTER — Other Ambulatory Visit: Payer: Self-pay | Admitting: Internal Medicine

## 2023-03-11 NOTE — Progress Notes (Signed)
Cardiology Office Note    Date:  03/12/2023   ID:  Lauren Banks, DOB 07/30/1966, MRN [id number]  PCP:  Erasmo Downer, NP  Cardiologist:  Yvonne Kendall, MD  Electrophysiologist:  Lanier Prude, MD   Chief Complaint: Follow up  History of Present Illness:   Lauren Banks is a 57 y.o. female with history of HFrEF secondary to NICM with subsequent normalization of LV systolic function by cardiac MRI in 07/2021, frequent PVCs, HTN, HLD, and tobacco use who presents for follow-up of her cardiomyopathy and PVCs.   She was evaluated by Dr. Okey Dupre as a new patient on 10/02/2020 at the request of her pulmonologist for evaluation of PVCs.  Echo on 10/23/2020 which showed an EF of 35 to 40%, global hypokinesis, grade 2 diastolic dysfunction, mildly reduced RV systolic function with a mildly enlarged RV cavity size, mild to moderate mitral regurgitation, mild to moderate aortic valve insufficiency with moderate aortic valve sclerosis with no evidence of aortic valve stenosis, and a right atrial pressure of 15 mmHg.  R/LHC in 11/2020 showed no angiographically significant CAD with severely elevated left heart, right heart, and pulmonary arterial pressures.  Severely reduced cardiac output/index in the context of markedly elevated systemic blood pressure.  She was admitted for diuresis and optimization of medical therapy.  Repeat limited echo in 01/2021 demonstrated an EF of 45 to 50%, no regional wall motion abnormalities, low normal RV systolic function with normal ventricular cavity size, trivial mitral regurgitation, and trivial aortic insufficiency.  Subsequent event monitoring showed a 12% PVC burden.  She was referred to EP with cardiac MRI showing normal biventricular systolic function and mild aortic regurgitation.  No etiology for PVCs was identified.  Repeat 3-day Zio patch showed a predominant rhythm of sinus with rare PACs and frequent PVCs, though PVC burden had decreased significantly from prior  study with a trend of 12% to less than 6%.  She was last seen in the office in 12/2022 and was without symptoms of angina or cardiac decompensation.  She did note continued dizziness that she described as if hearing something.  She was without syncope or fall.  She reported occasional palpitations.  She comes in today and is without symptoms of angina or cardiac decompensation.  She continues to note dizziness that she describes as if hearing something.  Dizziness is at times more pronounced with quick positional changes.  She also reports a severe episode of dizziness this past weekend that led to a fall.  No associated red flag symptoms.  No palpitations.  Blood pressure has largely been on the lower side in the 90s to low 100s systolic.  Her weight is down 9 pounds today when compared to her visit in 12/2022.   Labs independently reviewed: 11/2022 - potassium 3.8, BUN 14, serum creatinine 1.11 12/2021 - TSH normal, magnesium 1.9, albumin 4.5, AST/ALT normal, Hgb 13.8, PLT 339 10/2020 - direct LDL 164, TC 231, TG 262, HDL 29  Past Medical History:  Diagnosis Date   Chronic combined systolic (congestive) and diastolic (congestive) heart failure (HCC)    Frequent PVCs    Hyperlipemia    Hypertension    Nonischemic cardiomyopathy [facility])     Past Surgical History:  Procedure Laterality Date   ABDOMINAL HYSTERECTOMY  10/13/2011   Procedure: HYSTERECTOMY ABDOMINAL;  Surgeon: Tilda Burrow, MD;  Location: [facility];  Service: Gynecology;  Laterality: N/A;   CARDIAC CATHETERIZATION     RIGHT/LEFT HEART CATH AND CORONARY ANGIOGRAPHY N/A  11/19/2020   Procedure: RIGHT/LEFT HEART CATH AND CORONARY ANGIOGRAPHY;  Surgeon: Yvonne Kendall, MD;  Location: [facility];  Service: Cardiovascular;  Laterality: N/A;    Current Medications: Current Meds  Medication Sig   albuterol (VENTOLIN HFA) 108 (90 Base) MCG/ACT inhaler Inhale 2 puffs into the lungs every 4 (four) hours as needed for wheezing  or shortness of breath.   buPROPion (WELLBUTRIN XL) 300 MG 24 hr tablet Take 300 mg by mouth daily.   famotidine (PEPCID) 20 MG tablet TAKE 1 TABLET BY MOUTH ONCE DAILY.   fluticasone (FLONASE) 50 MCG/ACT nasal spray Place 1 spray into both nostrils daily as needed for allergies or rhinitis.   furosemide (LASIX) 40 MG tablet Take 1 tablet (40 mg total) by mouth daily.   hydrALAZINE (APRESOLINE) 10 MG tablet TAKE ONE TABLET BY MOUTH THREE TIMES A DAY   ipratropium-albuterol (DUONEB) 0.5-2.5 (3) MG/3ML SOLN Inhale 3 mLs into the lungs every 4 (four) hours as needed (Asthma).   isosorbide dinitrate (ISORDIL) 10 MG tablet Take 1 tablet (10 mg total) by mouth 3 (three) times daily.   magnesium oxide (MAG-OX) 400 (240 Mg) MG tablet Take 1 tablet (400 mg total) by mouth daily.   pantoprazole (PROTONIX) 40 MG tablet Take 1 tablet (40 mg total) by mouth daily. Take 30-60 min before first meal of the day   rosuvastatin (CRESTOR) 5 MG tablet Take 1 tablet (5 mg total) by mouth daily.   sacubitril-valsartan (ENTRESTO) 24-26 MG Take 1 tablet by mouth 2 (two) times daily.   spironolactone (ALDACTONE) 25 MG tablet Take 1 tablet (25 mg total) by mouth daily.   venlafaxine XR (EFFEXOR-XR) 37.5 MG 24 hr capsule Take 37.5 mg by mouth daily.   Vitamin D, Ergocalciferol, (DRISDOL) 1.25 MG (50000 UNIT) CAPS capsule Take 50,000 Units by mouth daily.   [DISCONTINUED] sacubitril-valsartan (ENTRESTO) 49-51 MG TAKE ONE TABLET BY MOUTH TWICE DAILY    Allergies:   Penicillin g and Penicillins   Social History   Socioeconomic History   Marital status: Married    Spouse name: Not on file   Number of children: Not on file   Years of education: Not on file   Highest education level: Not on file  Occupational History   Not on file  Tobacco Use   Smoking status: Every Day    Packs/day: 0.25    Years: 30.00    Additional pack years: 0.00    Total pack years: 7.50    Types: Cigarettes   Smokeless tobacco: Never    Tobacco comments:    2 packs a week.  Vaping Use   Vaping Use: Former  Substance and Sexual Activity   Alcohol use: No   Drug use: Yes    Frequency: 7.0 times per week    Types: Marijuana   Sexual activity: Yes    Birth control/protection: None, Surgical  Other Topics Concern   Not on file  Social History Narrative   Not on file   Social Determinants of Health   Financial Resource Strain: Not on file  Food Insecurity: Not on file  Transportation Needs: Not on file  Physical Activity: Not on file  Stress: Not on file  Social Connections: Not on file     Family History:  The patient's family history includes Alzheimer's disease in her mother; Cancer in her brother, maternal aunt, and mother; Diabetes in her father and sister; Gout in her brother and father; Heart disease in her sister; Hypertension in  her brother, father, and mother; Kidney disease in her father and maternal grandmother; Obesity in her sister; Stroke in her maternal grandmother and paternal grandmother. There is no history of Anesthesia problems.  ROS:   12-point review of systems is negative unless otherwise noted in the HPI.   EKGs/Labs/Other Studies Reviewed:    Studies reviewed were summarized above. The additional studies were reviewed today:  2D echo 10/23/2020:  1. Left ventricular ejection fraction, by estimation, is 35 to 40%. Left  ventricular ejection fraction by 3D volume is 43 %. The left ventricle has  moderately decreased function. The left ventricle demonstrates global  hypokinesis. Left ventricular  diastolic parameters are consistent with Grade II diastolic dysfunction  (pseudonormalization). The average left ventricular global longitudinal  strain is -10.2 %. The global longitudinal strain is abnormal.   2. Right ventricular systolic function is mildly reduced. The right  ventricular size is mildly enlarged.   3. The mitral valve is normal in structure. Mild to moderate mitral valve   regurgitation.   4. The aortic valve was not well visualized. Aortic valve regurgitation  is mild to moderate. Mild aortic valve sclerosis is present, with no  evidence of aortic valve stenosis.   5. The inferior vena cava is dilated in size with <50% respiratory  variability, suggesting right atrial pressure of 15 mmHg. __________   [facility] 11/19/2020: Conclusions: No angiographically significant coronary artery disease consistent with nonischemic cardiomyopathy. Severely elevated left heart, right heart, and pulmonary artery pressures. Severely reduced Fick cardiac output/index in the setting of markedly elevated systemic blood pressure.   Recommendations: Admit for diuresis and optimization of goal-directed medical therapy. Consider advanced heart failure consultation, ideally as an outpatient but potentially as transfer to [facility] if patient fails to respond to inotrope therapy and escalation of evidence-based heart failure agents. __________   Limited echo 02/05/2021: 1. Left ventricular ejection fraction, by estimation, is 45 to 50%. The  left ventricle has mildly decreased function. The left ventricle has no  regional wall motion abnormalities.   2. Right ventricular systolic function is low normal. The right  ventricular size is normal.   3. The mitral valve is normal in structure. Trivial mitral valve  regurgitation.   4. The aortic valve is tricuspid. Aortic valve regurgitation is trivial. __________   [name] patch 05/2021: The patient was monitored for 2 days, 10 hours. The predominant rhythm was sinus with an average rate of 74 bpm (range 57-114 bpm). There were rare PACs and frequent PVCs (12.2% PVC burden). No sustained arrhythmia or prolonged pause was observed. Patient triggered/diary events correspond to sinus rhythm.   Predominantly sinus rhythm with frequent PVCs (12% burden). __________   Cardiac MRI 07/14/2021: IMPRESSION: 1. Normal LV size and thickness. low  normal LV function, LVEF 53%. 2. Normal RV size and thickness, mildly reduced RV systolic function RVEF 42%. 3. There is no evidence for late gadolinium enhancement or scar. 4. Mild aortic regurgitation. 5. No findings to suggest etiology for PVC's. __________   [name] patch 07/2022:   The patient was monitored for 2 days, 11 hours.   The predominant rhythm was sinus with an average rate of 80 bpm (range 57-123 bpm in sinus).   There were rare PACs and frequent PVCs (PVC burden 5.6%).   Sustained arrhythmia or prolonged pause was observed.   Single patient triggered event corresponds to sinus rhythm with artifact.   Predominant sinus with rare PACs and frequent PVCs.  PVC burden has decreased significantly  from prior monitor in 05/2021 (12% -> 6%).   EKG:  EKG is ordered today.  The EKG ordered today demonstrates NSR, 69 bpm Occasional PVCs, RBBB  Recent Labs: 09/10/2022: Magnesium 2.0 11/26/2022: BUN 14; Creatinine, Ser 1.11; Potassium 3.8; Sodium 137  Recent Lipid Panel    Component Value Date/Time   CHOL 231 (H) 10/30/2020 1439   TRIG 262 (H) 10/30/2020 1439   HDL 29 (L) 10/30/2020 1439   CHOLHDL 8.0 (H) 10/30/2020 1439   LDLCALC 153 (H) 10/30/2020 1439   LDLDIRECT 164 (H) 10/30/2020 1439    PHYSICAL EXAM:    VS:  BP 98/60   Pulse 74   Ht 5\' 10"  (1.778 m)   Wt 257 lb 9.6 oz (116.8 kg)   LMP 09/21/2011 Comment: partial  SpO2 92%   BMI 36.96 kg/m   BMI: Body mass index is 36.96 kg/m.  Physical Exam Vitals reviewed.  Constitutional:      Appearance: She is well-developed.  HENT:     Head: Normocephalic and atraumatic.  Eyes:     General:        Right eye: No discharge.        Left eye: No discharge.  Neck:     Vascular: No JVD.  Cardiovascular:     Rate and Rhythm: Normal rate and regular rhythm.     Pulses:          Posterior tibial pulses are 2+ on the right side and 2+ on the left side.     Heart sounds: Normal heart sounds, S1 normal and S2 normal. Heart  sounds not distant. No midsystolic click and no opening snap. No murmur heard.    No friction rub.  Pulmonary:     Effort: Pulmonary effort is normal. No respiratory distress.     Breath sounds: Normal breath sounds. No decreased breath sounds, wheezing or rales.  Chest:     Chest wall: No tenderness.  Abdominal:     General: There is no distension.  Musculoskeletal:     Cervical back: Normal range of motion.     Right lower leg: No edema.     Left lower leg: No edema.  Skin:    General: Skin is warm and dry.     Nails: There is no clubbing.  Neurological:     Mental Status: She is alert and oriented to person, place, and time.  Psychiatric:        Speech: Speech normal.        Behavior: Behavior normal.        Thought Content: Thought content normal.        Judgment: Judgment normal.     Wt Readings from Last 3 Encounters:  03/12/23 257 lb 9.6 oz (116.8 kg)  12/04/22 266 lb (120.7 kg)  09/10/22 268 lb 6 oz (121.7 kg)     ASSESSMENT & PLAN:   HFimpEF: She is euvolemic and well compensated with NYHA class I symptoms.  She continues to have dizziness and a soft blood pressure.  In this setting we will decrease Entresto to 24/26 mg twice daily.  She will otherwise continue current GDMT including Isordil, hydralazine, Toprol-XL, spironolactone, and furosemide.  With ongoing dizziness will obtain echo to evaluate for any new structural abnormalities.  If dizziness resolves on lower dose Entresto, may consider canceling echo.  Prefers to defer repeat outpatient cardiac monitoring.  PVCs: Improved burden on repeat outpatient cardiac monitoring from 12 to 5%.  Continue Toprol-XL.  HTN: Blood  pressure is soft this morning at 98/60 at triage.  Reduce Entresto as outlined above with continuation of remaining GDMT.  HLD: LDL 164.  Previously intolerant to atorvastatin.  Tolerating rosuvastatin.  Look to repeat a fasting lipid panel and LFT at next visit.   Disposition: F/u with Dr. Okey Dupre  or an APP in 1 month and EP as directed.    Medication Adjustments/Labs and Tests Ordered: Current medicines are reviewed at length with the patient today.  Concerns regarding medicines are outlined above. Medication changes, Labs and Tests ordered today are summarized above and listed in the Patient Instructions accessible in Encounters.   Signed, Eula Listen, PA-C 03/12/2023 9:35 AM     [facility] [place] [address] [place], [place] [address] [phone]

## 2023-03-12 ENCOUNTER — Ambulatory Visit: Payer: BC Managed Care – PPO | Attending: Physician Assistant | Admitting: Physician Assistant

## 2023-03-12 ENCOUNTER — Encounter: Payer: Self-pay | Admitting: Physician Assistant

## 2023-03-12 VITALS — BP 98/60 | HR 74 | Ht 70.0 in | Wt 257.6 lb

## 2023-03-12 DIAGNOSIS — E782 Mixed hyperlipidemia: Secondary | ICD-10-CM

## 2023-03-12 DIAGNOSIS — I428 Other cardiomyopathies: Secondary | ICD-10-CM | POA: Diagnosis not present

## 2023-03-12 DIAGNOSIS — R42 Dizziness and giddiness: Secondary | ICD-10-CM

## 2023-03-12 DIAGNOSIS — I10 Essential (primary) hypertension: Secondary | ICD-10-CM

## 2023-03-12 DIAGNOSIS — I493 Ventricular premature depolarization: Secondary | ICD-10-CM

## 2023-03-12 DIAGNOSIS — I5022 Chronic systolic (congestive) heart failure: Secondary | ICD-10-CM

## 2023-03-12 MED ORDER — ENTRESTO 24-26 MG PO TABS: 1.0000 | ORAL_TABLET | Freq: Two times a day (BID) | ORAL | 11 refills | Status: DC

## 2023-03-12 NOTE — Patient Instructions (Signed)
Medication Instructions:  Your physician has recommended you make the following change in your medication:   DECREASE Entresto to 24-26 mg twice a day  *If you need a refill on your cardiac medications before your next appointment, please call your pharmacy*   Lab Work: None  If you have labs (blood work) drawn today and your tests are completely normal, you will receive your results only by: MyChart Message (if you have MyChart) OR A paper copy in the mail If you have any lab test that is abnormal or we need to change your treatment, we will call you to review the results.   Testing/Procedures: Your physician has requested that you have an echocardiogram. Echocardiography is a painless test that uses sound waves to create images of your heart. It provides your doctor with information about the size and shape of your heart and how well your heart's chambers and valves are working. This procedure takes approximately one hour. There are no restrictions for this procedure. Please do NOT wear cologne, perfume, aftershave, or lotions (deodorant is allowed). Please arrive 15 minutes prior to your appointment time.    Follow-Up: At [facility], you and your health needs are our priority.  As part of our continuing mission to provide you with exceptional heart care, we have created designated Provider Care Teams.  These Care Teams include your primary Cardiologist (physician) and Advanced Practice Providers (APPs -  Physician Assistants and Nurse Practitioners) who all work together to provide you with the care you need, when you need it.   Your next appointment:   1 month(s)  Provider:   Yvonne Kendall, MD or Eula Listen, PA-C

## 2023-03-21 ENCOUNTER — Other Ambulatory Visit: Payer: Self-pay | Admitting: Internal Medicine

## 2023-03-22 NOTE — Telephone Encounter (Signed)
last visit 03/12/23 -- decrease Entresto to 24/26 mg twice daily (rx sent with new dose on 03/12/23)--Disposition: F/u with Dr. Okey Dupre or an APP in 1 month and EP as directed.  next visit 04/29/23

## 2023-04-08 ENCOUNTER — Other Ambulatory Visit
Admission: RE | Admit: 2023-04-08 | Discharge: 2023-04-08 | Disposition: A | Discharge status: Home / Self Care | Payer: BC Managed Care – PPO | Attending: Internal Medicine | Admitting: Internal Medicine

## 2023-04-08 DIAGNOSIS — Z79899 Other long term (current) drug therapy: Secondary | ICD-10-CM | POA: Diagnosis not present

## 2023-04-08 LAB — LIPID PANEL
Cholesterol: 197 mg/dL (ref 0–200)
HDL: 36 mg/dL — ABNORMAL LOW (ref >40)
LDL Cholesterol: 124 mg/dL — ABNORMAL HIGH (ref 0–99)
Total CHOL/HDL Ratio: 5.5 RATIO
Triglycerides: 185 mg/dL — ABNORMAL HIGH (ref <150)
VLDL: 37 mg/dL (ref 0–40)

## 2023-04-08 LAB — ALT: ALT: 13 U/L (ref 0–44)

## 2023-04-18 ENCOUNTER — Other Ambulatory Visit: Payer: Self-pay | Admitting: Internal Medicine

## 2023-04-18 DIAGNOSIS — I5022 Chronic systolic (congestive) heart failure: Secondary | ICD-10-CM

## 2023-04-19 NOTE — Telephone Encounter (Signed)
Please advise if ok to refill. 

## 2023-04-27 DIAGNOSIS — Z6837 Body mass index (BMI) 37.0-37.9, adult: Secondary | ICD-10-CM | POA: Diagnosis not present

## 2023-04-27 DIAGNOSIS — F419 Anxiety disorder, unspecified: Secondary | ICD-10-CM | POA: Diagnosis not present

## 2023-04-27 DIAGNOSIS — E782 Mixed hyperlipidemia: Secondary | ICD-10-CM | POA: Diagnosis not present

## 2023-04-27 DIAGNOSIS — Z713 Dietary counseling and surveillance: Secondary | ICD-10-CM | POA: Diagnosis not present

## 2023-04-27 NOTE — Progress Notes (Unsigned)
Cardiology Office Note    Date:  04/29/2023   ID:  Lauren Banks, DOB 03/15/1966, MRN [id number]  PCP:  Erasmo Downer, NP  Cardiologist:  Yvonne Kendall, MD  Electrophysiologist:  Lanier Prude, MD   Chief Complaint: Follow up  History of Present Illness:   Lauren Banks is a 57 y.o. female with history of HFrEF secondary to NICM with subsequent normalization of LV systolic function by cardiac MRI in 07/2021, frequent PVCs, HTN, HLD, and tobacco use who presents for follow-up of her cardiomyopathy and PVCs.   She was evaluated by Dr. Okey Dupre as a new patient on 10/02/2020 at the request of her pulmonologist for evaluation of PVCs.  Echo on 10/23/2020 which showed an EF of 35 to 40%, global hypokinesis, grade 2 diastolic dysfunction, mildly reduced RV systolic function with a mildly enlarged RV cavity size, mild to moderate mitral regurgitation, mild to moderate aortic valve insufficiency with moderate aortic valve sclerosis with no evidence of aortic valve stenosis, and a right atrial pressure of 15 mmHg.  R/LHC in 11/2020 showed no angiographically significant CAD with severely elevated left heart, right heart, and pulmonary arterial pressures.  Severely reduced cardiac output/index in the context of markedly elevated systemic blood pressure.  She was admitted for diuresis and optimization of medical therapy.  Repeat limited echo in 01/2021 demonstrated an EF of 45 to 50%, no regional wall motion abnormalities, low normal RV systolic function with normal ventricular cavity size, trivial mitral regurgitation, and trivial aortic insufficiency.  Subsequent event monitoring showed a 12% PVC burden.  She was referred to EP with cardiac MRI showing normal biventricular systolic function and mild aortic regurgitation.  No etiology for PVCs was identified.  Repeat 3-day Zio patch showed a predominant rhythm of sinus with rare PACs and frequent PVCs, though PVC burden had decreased significantly from prior  study with a trend of 12% to less than 6%.  She was last seen in the office in 03/2023 and remained without symptoms of angina or cardiac decompensation.  She continued to note dizziness that she described as if she was hearing something.  Dizziness at times was more pronounced with quick positional changes.  Blood pressure was largely in the 90s to low 100s mmHg systolic.  Entresto was decreased to 24/26 mg twice daily with continuation of Isordil, hydralazine, Toprol-XL, spironolactone, and furosemide.  She preferred to defer outpatient cardiac monitoring.  Echo on 04/28/2023 demonstrated an EF of 55 to 60%, no regional wall motion abnormalities, grade 1 diastolic dysfunction, normal RV systolic function with mildly enlarged ventricular cavity size, mild mitral regurgitation, mild to moderate aortic insufficiency, aortic valve sclerosis without evidence of stenosis, and a normal CVP.  She comes in today and is without symptoms of angina or cardiac decompensation.  She continues to note dizziness that will occur with quick positional changes, though will also occur randomly.  She feels like her blood pressure is running too low and this is contributing to her dizziness.  No presyncope or syncope.  Palpitation burden has improved when compared to last visit.  Weight is stable.   Labs independently reviewed: 04/2023 - TC 197, TG 185, HDL 36, LDL 124, ALT normal 11/2022 - potassium 3.8, BUN 14, serum creatinine 1.11 12/2021 - TSH normal, magnesium 1.9, albumin 4.5, AST/ALT normal, Hgb 13.8, PLT 339   Past Medical History:  Diagnosis Date   Chronic combined systolic (congestive) and diastolic (congestive) heart failure (HCC)    Frequent PVCs  Hyperlipemia    Hypertension    Nonischemic cardiomyopathy [facility])     Past Surgical History:  Procedure Laterality Date   ABDOMINAL HYSTERECTOMY  10/13/2011   Procedure: HYSTERECTOMY ABDOMINAL;  Surgeon: Tilda Burrow, MD;  Location: [facility];  Service:  Gynecology;  Laterality: N/A;   CARDIAC CATHETERIZATION     RIGHT/LEFT HEART CATH AND CORONARY ANGIOGRAPHY N/A 11/19/2020   Procedure: RIGHT/LEFT HEART CATH AND CORONARY ANGIOGRAPHY;  Surgeon: Yvonne Kendall, MD;  Location: [facility] INVASIVE CV LAB;  Service: Cardiovascular;  Laterality: N/A;    Current Medications: Current Meds  Medication Sig   albuterol (VENTOLIN HFA) 108 (90 Base) MCG/ACT inhaler Inhale 2 puffs into the lungs every 4 (four) hours as needed for wheezing or shortness of breath.   buPROPion (WELLBUTRIN XL) 300 MG 24 hr tablet Take 300 mg by mouth daily.   escitalopram (LEXAPRO) 20 MG tablet Take 20 mg by mouth daily.   famotidine (PEPCID) 20 MG tablet TAKE 1 TABLET BY MOUTH ONCE DAILY.   fluticasone (FLONASE) 50 MCG/ACT nasal spray Place 1 spray into both nostrils daily as needed for allergies or rhinitis.   furosemide (LASIX) 40 MG tablet Take 1 tablet (40 mg total) by mouth daily.   ipratropium-albuterol (DUONEB) 0.5-2.5 (3) MG/3ML SOLN Inhale 3 mLs into the lungs every 4 (four) hours as needed (Asthma).   losartan (COZAAR) 25 MG tablet Take 1 tablet (25 mg total) by mouth daily.   magnesium oxide (MAG-OX) 400 (240 Mg) MG tablet TAKE ONE TABLET BY MOUTH EVERY DAY   metoprolol succinate (TOPROL-XL) 50 MG 24 hr tablet Take 1 tablet (50 mg total) by mouth 2 (two) times daily. Take with or immediately following a meal.   pantoprazole (PROTONIX) 40 MG tablet Take 1 tablet (40 mg total) by mouth daily. Take 30-60 min before first meal of the day   rosuvastatin (CRESTOR) 5 MG tablet Take 1 tablet (5 mg total) by mouth daily.   spironolactone (ALDACTONE) 25 MG tablet Take 1 tablet (25 mg total) by mouth daily.   venlafaxine XR (EFFEXOR-XR) 37.5 MG 24 hr capsule Take 37.5 mg by mouth daily.   Vitamin D, Ergocalciferol, (DRISDOL) 1.25 MG (50000 UNIT) CAPS capsule Take 50,000 Units by mouth daily.   [DISCONTINUED] hydrALAZINE (APRESOLINE) 10 MG tablet TAKE ONE TABLET BY MOUTH THREE  TIMES A DAY   [DISCONTINUED] isosorbide dinitrate (ISORDIL) 10 MG tablet Take 1 tablet (10 mg total) by mouth 3 (three) times daily.   [DISCONTINUED] sacubitril-valsartan (ENTRESTO) 24-26 MG Take 1 tablet by mouth 2 (two) times daily.    Allergies:   Penicillin g and Penicillins   Social History   Socioeconomic History   Marital status: Married    Spouse name: Not on file   Number of children: Not on file   Years of education: Not on file   Highest education level: Not on file  Occupational History   Not on file  Tobacco Use   Smoking status: Every Day    Packs/day: 0.25    Years: 30.00    Additional pack years: 0.00    Total pack years: 7.50    Types: Cigarettes   Smokeless tobacco: Never   Tobacco comments:    2 packs a week.  Vaping Use   Vaping Use: Former  Substance and Sexual Activity   Alcohol use: No   Drug use: Yes    Frequency: 7.0 times per week    Types: Marijuana   Sexual activity: Yes  Birth control/protection: None, Surgical  Other Topics Concern   Not on file  Social History Narrative   Not on file   Social Determinants of Health   Financial Resource Strain: Not on file  Food Insecurity: Not on file  Transportation Needs: Not on file  Physical Activity: Not on file  Stress: Not on file  Social Connections: Not on file     Family History:  The patient's family history includes Alzheimer's disease in her mother; Cancer in her brother, maternal aunt, and mother; Diabetes in her father and sister; Gout in her brother and father; Heart disease in her sister; Hypertension in her brother, father, and mother; Kidney disease in her father and maternal grandmother; Obesity in her sister; Stroke in her maternal grandmother and paternal grandmother. There is no history of Anesthesia problems.  ROS:   12-point review of systems is negative unless otherwise noted in the HPI.   EKGs/Labs/Other Studies Reviewed:    Studies reviewed were summarized above.  The additional studies were reviewed today:  2D echo 10/23/2020:  1. Left ventricular ejection fraction, by estimation, is 35 to 40%. Left  ventricular ejection fraction by 3D volume is 43 %. The left ventricle has  moderately decreased function. The left ventricle demonstrates global  hypokinesis. Left ventricular  diastolic parameters are consistent with Grade II diastolic dysfunction  (pseudonormalization). The average left ventricular global longitudinal  strain is -10.2 %. The global longitudinal strain is abnormal.   2. Right ventricular systolic function is mildly reduced. The right  ventricular size is mildly enlarged.   3. The mitral valve is normal in structure. Mild to moderate mitral valve  regurgitation.   4. The aortic valve was not well visualized. Aortic valve regurgitation  is mild to moderate. Mild aortic valve sclerosis is present, with no  evidence of aortic valve stenosis.   5. The inferior vena cava is dilated in size with <50% respiratory  variability, suggesting right atrial pressure of 15 mmHg. __________   [facility] 11/19/2020: Conclusions: No angiographically significant coronary artery disease consistent with nonischemic cardiomyopathy. Severely elevated left heart, right heart, and pulmonary artery pressures. Severely reduced Fick cardiac output/index in the setting of markedly elevated systemic blood pressure.   Recommendations: Admit for diuresis and optimization of goal-directed medical therapy. Consider advanced heart failure consultation, ideally as an outpatient but potentially as transfer to [facility] if patient fails to respond to inotrope therapy and escalation of evidence-based heart failure agents. __________   Limited echo 02/05/2021: 1. Left ventricular ejection fraction, by estimation, is 45 to 50%. The  left ventricle has mildly decreased function. The left ventricle has no  regional wall motion abnormalities.   2. Right ventricular systolic  function is low normal. The right  ventricular size is normal.   3. The mitral valve is normal in structure. Trivial mitral valve  regurgitation.   4. The aortic valve is tricuspid. Aortic valve regurgitation is trivial. __________   [name] patch 05/2021: The patient was monitored for 2 days, 10 hours. The predominant rhythm was sinus with an average rate of 74 bpm (range 57-114 bpm). There were rare PACs and frequent PVCs (12.2% PVC burden). No sustained arrhythmia or prolonged pause was observed. Patient triggered/diary events correspond to sinus rhythm.   Predominantly sinus rhythm with frequent PVCs (12% burden). __________   Cardiac MRI 07/14/2021: IMPRESSION: 1. Normal LV size and thickness. low normal LV function, LVEF 53%. 2. Normal RV size and thickness, mildly reduced RV systolic function RVEF  42%. 3. There is no evidence for late gadolinium enhancement or scar. 4. Mild aortic regurgitation. 5. No findings to suggest etiology for PVC's. __________   [name] patch 07/2022:   The patient was monitored for 2 days, 11 hours.   The predominant rhythm was sinus with an average rate of 80 bpm (range 57-123 bpm in sinus).   There were rare PACs and frequent PVCs (PVC burden 5.6%).   Sustained arrhythmia or prolonged pause was observed.   Single patient triggered event corresponds to sinus rhythm with artifact.   Predominant sinus with rare PACs and frequent PVCs.  PVC burden has decreased significantly from prior monitor in 05/2021 (12% -> 6%). __________  2D echo 04/28/2023: 1. Left ventricular ejection fraction, by estimation, is 55 to 60%. The  left ventricle has normal function. The left ventricle has no regional  wall motion abnormalities. Left ventricular diastolic parameters are  consistent with Grade I diastolic  dysfunction (impaired relaxation).   2. Right ventricular systolic function is normal. The right ventricular  size is mildly enlarged.   3. The mitral valve is  normal in structure. Mild mitral valve  regurgitation.   4. The aortic valve is calcified. Aortic valve regurgitation is mild to  moderate. Aortic valve sclerosis/calcification is present, without any  evidence of aortic stenosis. Aortic valve mean gradient measures 8.0 mmHg.   5. The inferior vena cava is normal in size with greater than 50%  respiratory variability, suggesting right atrial pressure of 3 mmHg.    EKG:  EKG is not ordered today.   Recent Labs: 09/10/2022: Magnesium 2.0 11/26/2022: BUN 14; Creatinine, Ser 1.11; Potassium 3.8; Sodium 137 04/08/2023: ALT 13  Recent Lipid Panel    Component Value Date/Time   CHOL 197 04/08/2023 0804   CHOL 231 (H) 10/30/2020 1439   TRIG 185 (H) 04/08/2023 0804   HDL 36 (L) 04/08/2023 0804   HDL 29 (L) 10/30/2020 1439   CHOLHDL 5.5 04/08/2023 0804   VLDL 37 04/08/2023 0804   LDLCALC 124 (H) 04/08/2023 0804   LDLCALC 153 (H) 10/30/2020 1439   LDLDIRECT 164 (H) 10/30/2020 1439    PHYSICAL EXAM:    VS:  BP (!) 106/58 (BP Location: Left Arm, Patient Position: Sitting, Cuff Size: Large)   Pulse 66   Ht 5\' 10"  (1.778 m)   Wt 257 lb 12.8 oz (116.9 kg)   LMP 09/21/2011 Comment: partial  SpO2 98%   BMI 36.99 kg/m   BMI: Body mass index is 36.99 kg/m.  Physical Exam Vitals reviewed.  Constitutional:      Appearance: She is well-developed.  HENT:     Head: Normocephalic and atraumatic.  Eyes:     General:        Right eye: No discharge.        Left eye: No discharge.  Neck:     Vascular: No JVD.  Cardiovascular:     Rate and Rhythm: Normal rate and regular rhythm.     Heart sounds: Normal heart sounds, S1 normal and S2 normal. Heart sounds not distant. No midsystolic click and no opening snap. No murmur heard.    No friction rub.  Pulmonary:     Effort: Pulmonary effort is normal. No respiratory distress.     Breath sounds: Normal breath sounds. No decreased breath sounds, wheezing or rales.  Chest:     Chest wall: No  tenderness.  Abdominal:     General: There is no distension.  Musculoskeletal:  Cervical back: Normal range of motion.  Skin:    General: Skin is warm and dry.     Nails: There is no clubbing.  Neurological:     Mental Status: She is alert and oriented to person, place, and time.  Psychiatric:        Speech: Speech normal.        Behavior: Behavior normal.        Thought Content: Thought content normal.        Judgment: Judgment normal.     Wt Readings from Last 3 Encounters:  04/29/23 257 lb 12.8 oz (116.9 kg)  03/12/23 257 lb 9.6 oz (116.8 kg)  12/04/22 266 lb (120.7 kg)     ASSESSMENT & PLAN:   HFimpEF: Euvolemic and well compensated with NYHA class I symptoms.  Most recent echo from 04/28/2023 showed normal LV systolic function.  She continues to have dizziness and soft blood pressure.  In this setting, we will discontinue hydralazine, isosorbide, and Entresto (blood pressure remains soft following tapering of Entresto).  Start losartan 25 mg daily.  Continue Toprol-XL, spironolactone, and furosemide.  Dizziness: Medical management as above.  PVCs: Symptoms improving.  Improved burden on repeat outpatient cardiac monitoring from 12% to 5%.  She remains on Toprol-XL 50 mg twice daily.  Valvular heart disease: Echo from 04/28/2023 demonstrated mild mitral regurgitation with mild to moderate aortic insufficiency, which was previously noted by echo in 2021 and stable when compared to that study.  Periodic monitoring.  HTN: Blood pressure remains on the low side despite decreasing of Entresto at last visit.  Query if her dizziness is related to low blood pressure.  In this setting, we have discontinued hydralazine, isosorbide dinitrate, and Entresto.  Start losartan 25 mg daily as outlined above with continuation of 50 mg twice daily given PVC burden renal lactone.  HLD: LDL 124 in 04/2023.  Previously intolerant to atorvastatin.  Remains on rosuvastatin.  Heart healthy diet  encouraged.    Disposition: F/u with Dr. Okey Dupre or an APP in 2 months.   Medication Adjustments/Labs and Tests Ordered: Current medicines are reviewed at length with the patient today.  Concerns regarding medicines are outlined above. Medication changes, Labs and Tests ordered today are summarized above and listed in the Patient Instructions accessible in Encounters.   Signed, Eula Listen, PA-C 04/29/2023 9:07 AM      [facility] [place] [address] [place], [place] [address] [phone]

## 2023-04-28 ENCOUNTER — Ambulatory Visit: Payer: BC Managed Care – PPO | Attending: Physician Assistant

## 2023-04-28 DIAGNOSIS — I428 Other cardiomyopathies: Secondary | ICD-10-CM | POA: Diagnosis not present

## 2023-04-28 LAB — ECHOCARDIOGRAM COMPLETE
AR max vel: 2.09 cm2
AV Area VTI: 2.15 cm2
AV Area mean vel: 2.09 cm2
AV Mean grad: 8 mmHg
AV Peak grad: 13.1 mmHg
AV Vena cont: 0.3 cm
Ao pk vel: 1.81 m/s
Area-P 1/2: 2.65 cm2
Calc EF: 45 %
P 1/2 time: 1224 msec
S' Lateral: 4.5 cm
Single Plane A2C EF: 45.8 %
Single Plane A4C EF: 46.7 %

## 2023-04-29 ENCOUNTER — Encounter: Payer: Self-pay | Admitting: Physician Assistant

## 2023-04-29 ENCOUNTER — Ambulatory Visit: Payer: BC Managed Care – PPO | Admitting: Physician Assistant

## 2023-04-29 VITALS — BP 106/58 | HR 66 | Ht 70.0 in | Wt 257.8 lb

## 2023-04-29 DIAGNOSIS — I428 Other cardiomyopathies: Secondary | ICD-10-CM | POA: Diagnosis not present

## 2023-04-29 DIAGNOSIS — I493 Ventricular premature depolarization: Secondary | ICD-10-CM | POA: Diagnosis not present

## 2023-04-29 DIAGNOSIS — R42 Dizziness and giddiness: Secondary | ICD-10-CM

## 2023-04-29 DIAGNOSIS — E782 Mixed hyperlipidemia: Secondary | ICD-10-CM

## 2023-04-29 DIAGNOSIS — I10 Essential (primary) hypertension: Secondary | ICD-10-CM

## 2023-04-29 DIAGNOSIS — I5022 Chronic systolic (congestive) heart failure: Secondary | ICD-10-CM

## 2023-04-29 MED ORDER — LOSARTAN POTASSIUM 25 MG PO TABS: 25.0000 mg | ORAL_TABLET | Freq: Every day | ORAL | 3 refills | Status: DC

## 2023-04-29 NOTE — Patient Instructions (Signed)
Medication Instructions:  Your physician has recommended you make the following change in your medication:   STOP Hydralazine STOP Isosorbide mononitrate STOP Entresto START Losartan 25 mg once daily   These changes will take effect on next pill pack fill. We will call pharmacy to update them of these changes.   *If you need a refill on your cardiac medications before your next appointment, please call your pharmacy*   Lab Work: None  If you have labs (blood work) drawn today and your tests are completely normal, you will receive your results only by: MyChart Message (if you have MyChart) OR A paper copy in the mail If you have any lab test that is abnormal or we need to change your treatment, we will call you to review the results.   Testing/Procedures: None   Follow-Up: At [facility], you and your health needs are our priority.  As part of our continuing mission to provide you with exceptional heart care, we have created designated Provider Care Teams.  These Care Teams include your primary Cardiologist (physician) and Advanced Practice Providers (APPs -  Physician Assistants and Nurse Practitioners) who all work together to provide you with the care you need, when you need it.   Your next appointment:   2 month(s)  Provider:   Yvonne Kendall, MD or Eula Listen, PA-C

## 2023-05-27 ENCOUNTER — Encounter: Payer: Self-pay | Admitting: Internal Medicine

## 2023-06-18 ENCOUNTER — Other Ambulatory Visit: Payer: Self-pay | Admitting: Internal Medicine

## 2023-06-28 NOTE — Progress Notes (Deleted)
Cardiology Office Note    Date:  06/28/2023   ID:  Lauren [facility], DOB 1966-03-14, MRN [id number]  PCP:  Lauren Downer, NP  Cardiologist:  Lauren Kendall, MD  Electrophysiologist:  Lauren Prude, MD   Chief Complaint: Follow-up  History of Present Illness:   Lauren Banks is a 57 y.o. female with history of ***  ***   Labs independently reviewed: 04/2023 - TC 197, TG 185, HDL 36, LDL 124, ALT normal 11/2022 - potassium 3.8, BUN 14, serum creatinine 1.11 12/2021 - TSH normal, magnesium 1.9, albumin 4.5, AST/ALT normal, Hgb 13.8, PLT 339  Past Medical History:  Diagnosis Date   Chronic combined systolic (congestive) and diastolic (congestive) heart failure (HCC)    Frequent PVCs    Hyperlipemia    Hypertension    Nonischemic cardiomyopathy (HCC)     Past Surgical History:  Procedure Laterality Date   ABDOMINAL HYSTERECTOMY  10/13/2011   Procedure: HYSTERECTOMY ABDOMINAL;  Surgeon: Lauren Burrow, MD;  Location: [facility];  Service: Gynecology;  Laterality: N/A;   CARDIAC CATHETERIZATION     RIGHT/LEFT HEART CATH AND CORONARY ANGIOGRAPHY N/A 11/19/2020   Procedure: RIGHT/LEFT HEART CATH AND CORONARY ANGIOGRAPHY;  Surgeon: Lauren Kendall, MD;  Location: [facility];  Service: Cardiovascular;  Laterality: N/A;    Current Medications: No outpatient medications have been marked as taking for the 07/02/23 encounter (Appointment) with Lauren Barges, PA-C.    Allergies:   Penicillin g and Penicillins   Social History   Socioeconomic History   Marital status: Married    Spouse name: Not on file   Number of children: Not on file   Years of education: Not on file   Highest education level: Not on file  Occupational History   Not on file  Tobacco Use   Smoking status: Every Day    Current packs/day: 0.25    Average packs/day: 0.3 packs/day for 30.0 years (7.5 ttl pk-yrs)    Types: Cigarettes   Smokeless tobacco: Never   Tobacco comments:    2 packs a  week.  Vaping Use   Vaping status: Former  Substance and Sexual Activity   Alcohol use: No   Drug use: Yes    Frequency: 7.0 times per week    Types: Marijuana   Sexual activity: Yes    Birth control/protection: None, Surgical  Other Topics Concern   Not on file  Social History Narrative   Not on file   Social Determinants of Health   Financial Resource Strain: Not on file  Food Insecurity: Not on file  Transportation Needs: Not on file  Physical Activity: Not on file  Stress: Not on file  Social Connections: Not on file     Family History:  The patient's family history includes Alzheimer's disease in her mother; Cancer in her brother, maternal aunt, and mother; Diabetes in her father and sister; Gout in her brother and father; Heart disease in her sister; Hypertension in her brother, father, and mother; Kidney disease in her father and maternal grandmother; Obesity in her sister; Stroke in her maternal grandmother and paternal grandmother. There is no history of Anesthesia problems.  ROS:   ROS   EKGs/Labs/Other Studies Reviewed:    Studies reviewed were summarized above. The additional studies were reviewed today:  2D echo 10/23/2020:  1. Left ventricular ejection fraction, by estimation, is 35 to 40%. Left  ventricular ejection fraction by 3D volume is 43 %. The left ventricle has  moderately decreased function. The left ventricle demonstrates global  hypokinesis. Left ventricular  diastolic parameters are consistent with Grade II diastolic dysfunction  (pseudonormalization). The average left ventricular global longitudinal  strain is -10.2 %. The global longitudinal strain is abnormal.   2. Right ventricular systolic function is mildly reduced. The right  ventricular size is mildly enlarged.   3. The mitral valve is normal in structure. Mild to moderate mitral valve  regurgitation.   4. The aortic valve was not well visualized. Aortic valve regurgitation  is mild  to moderate. Mild aortic valve sclerosis is present, with no  evidence of aortic valve stenosis.   5. The inferior vena cava is dilated in size with <50% respiratory  variability, suggesting right atrial pressure of 15 mmHg. __________   [facility] 11/19/2020: Conclusions: No angiographically significant coronary artery disease consistent with nonischemic cardiomyopathy. Severely elevated left heart, right heart, and pulmonary artery pressures. Severely reduced Fick cardiac output/index in the setting of markedly elevated systemic blood pressure.   Recommendations: Admit for diuresis and optimization of goal-directed medical therapy. Consider advanced heart failure consultation, ideally as an outpatient but potentially as transfer to [facility] if patient fails to respond to inotrope therapy and escalation of evidence-based heart failure agents. __________   Limited echo 02/05/2021: 1. Left ventricular ejection fraction, by estimation, is 45 to 50%. The  left ventricle has mildly decreased function. The left ventricle has no  regional wall motion abnormalities.   2. Right ventricular systolic function is low normal. The right  ventricular size is normal.   3. The mitral valve is normal in structure. Trivial mitral valve  regurgitation.   4. The aortic valve is tricuspid. Aortic valve regurgitation is trivial. __________   [facility] patch 05/2021: The patient was monitored for 2 days, 10 hours. The predominant rhythm was sinus with an average rate of 74 bpm (range 57-114 bpm). There were rare PACs and frequent PVCs (12.2% PVC burden). No sustained arrhythmia or prolonged pause was observed. Patient triggered/diary events correspond to sinus rhythm.   Predominantly sinus rhythm with frequent PVCs (12% burden). __________   Cardiac MRI 07/14/2021: IMPRESSION: 1. Normal LV size and thickness. low normal LV function, LVEF 53%. 2. Normal RV size and thickness, mildly reduced RV systolic  function RVEF 42%. 3. There is no evidence for late gadolinium enhancement or scar. 4. Mild aortic regurgitation. 5. No findings to suggest etiology for PVC's. __________   [facility] patch 07/2022:   The patient was monitored for 2 days, 11 hours.   The predominant rhythm was sinus with an average rate of 80 bpm (range 57-123 bpm in sinus).   There were rare PACs and frequent PVCs (PVC burden 5.6%).   Sustained arrhythmia or prolonged pause was observed.   Single patient triggered event corresponds to sinus rhythm with artifact.   Predominant sinus with rare PACs and frequent PVCs.  PVC burden has decreased significantly from prior monitor in 05/2021 (12% -> 6%). __________   2D echo 04/28/2023: 1. Left ventricular ejection fraction, by estimation, is 55 to 60%. The  left ventricle has normal function. The left ventricle has no regional  wall motion abnormalities. Left ventricular diastolic parameters are  consistent with Grade I diastolic  dysfunction (impaired relaxation).   2. Right ventricular systolic function is normal. The right ventricular  size is mildly enlarged.   3. The mitral valve is normal in structure. Mild mitral valve  regurgitation.   4. The aortic valve is calcified. Aortic valve  regurgitation is mild to  moderate. Aortic valve sclerosis/calcification is present, without any  evidence of aortic stenosis. Aortic valve mean gradient measures 8.0 mmHg.   5. The inferior vena cava is normal in size with greater than 50%  respiratory variability, suggesting right atrial pressure of 3 mmHg.   EKG:  EKG is ordered today.  The EKG ordered today demonstrates ***  Recent Labs: 09/10/2022: Magnesium 2.0 11/26/2022: BUN 14; Creatinine, Ser 1.11; Potassium 3.8; Sodium 137 04/08/2023: ALT 13  Recent Lipid Panel    Component Value Date/Time   CHOL 197 04/08/2023 0804   CHOL 231 (H) 10/30/2020 1439   TRIG 185 (H) 04/08/2023 0804   HDL 36 (L) 04/08/2023 0804   HDL 29 (L)  10/30/2020 1439   CHOLHDL 5.5 04/08/2023 0804   VLDL 37 04/08/2023 0804   LDLCALC 124 (H) 04/08/2023 0804   LDLCALC 153 (H) 10/30/2020 1439   LDLDIRECT 164 (H) 10/30/2020 1439    PHYSICAL EXAM:    VS:  LMP 09/21/2011 Comment: partial  BMI: There is no height or weight on file to calculate BMI.  Physical Exam  Wt Readings from Last 3 Encounters:  04/29/23 257 lb 12.8 oz (116.9 kg)  03/12/23 257 lb 9.6 oz (116.8 kg)  12/04/22 266 lb (120.7 kg)     ASSESSMENT & PLAN:   HFimpEF:   Dizziness:  PVCs:  Valvular heart disease: Echo from 04/28/2023 demonstrated mild mitral regurgitation with mild to moderate aortic insufficiency, which was previously noted by echo in 2021 and stable when compared to that study.   HTN: Blood pressure  HLD: LDL 124 in 04/2023.  Previously intolerant to atorvastatin.   {Are you ordering a CV Procedure (e.g. stress test, cath, DCCV, TEE, etc)?   Press F2        :[id number]}     Disposition: F/u with Dr. Okey Dupre or an APP in ***.   Medication Adjustments/Labs and Tests Ordered: Current medicines are reviewed at length with the patient today.  Concerns regarding medicines are outlined above. Medication changes, Labs and Tests ordered today are summarized above and listed in the Patient Instructions accessible in Encounters.   Signed, Eula Listen, PA-C 06/28/2023 12:58 PM     [facility] [place] [address] [place], [place] [address] [phone]

## 2023-07-02 ENCOUNTER — Ambulatory Visit: Payer: BC Managed Care – PPO | Admitting: Physician Assistant

## 2023-07-13 ENCOUNTER — Other Ambulatory Visit: Payer: Self-pay | Admitting: Internal Medicine

## 2023-07-15 ENCOUNTER — Other Ambulatory Visit: Payer: Self-pay | Admitting: Internal Medicine

## 2023-07-15 DIAGNOSIS — I5022 Chronic systolic (congestive) heart failure: Secondary | ICD-10-CM

## 2023-07-20 ENCOUNTER — Other Ambulatory Visit: Payer: Self-pay | Admitting: Internal Medicine

## 2023-07-26 NOTE — Progress Notes (Deleted)
Cardiology Office Note    Date:  07/26/2023   ID:  Lauren [facility], DOB Apr 24, 1966, MRN [id number]  PCP:  Erasmo Downer, NP  Cardiologist:  Yvonne Kendall, MD  Electrophysiologist:  Lanier Prude, MD   Chief Complaint: Follow-up  History of Present Illness:   Lauren [facility] is a 57 y.o. female with history of HFrEF secondary to NICM with subsequent normalization of LV systolic function by cardiac MRI in 07/2021, frequent PVCs, HTN, HLD, and tobacco use who presents for follow-up of her cardiomyopathy and PVCs.   She was evaluated by Dr. Okey Dupre as a new patient on 10/02/2020 at the request of her pulmonologist for evaluation of PVCs.  Echo on 10/23/2020 which showed an EF of 35 to 40%, global hypokinesis, grade 2 diastolic dysfunction, mildly reduced RV systolic function with a mildly enlarged RV cavity size, mild to moderate mitral regurgitation, mild to moderate aortic valve insufficiency with moderate aortic valve sclerosis with no evidence of aortic valve stenosis, and a right atrial pressure of 15 mmHg.  R/LHC in 11/2020 showed no angiographically significant CAD with severely elevated left heart, right heart, and pulmonary arterial pressures.  Severely reduced cardiac output/index in the context of markedly elevated systemic blood pressure.  She was admitted for diuresis and optimization of medical therapy.  Repeat limited echo in 01/2021 demonstrated an EF of 45 to 50%, no regional wall motion abnormalities, low normal RV systolic function with normal ventricular cavity size, trivial mitral regurgitation, and trivial aortic insufficiency.  Subsequent event monitoring showed a 12% PVC burden.  She was referred to EP with cardiac MRI showing normal biventricular systolic function and mild aortic regurgitation.  No etiology for PVCs was identified.  Repeat 3-day Zio patch showed a predominant rhythm of sinus with rare PACs and frequent PVCs, though PVC burden had decreased significantly from prior  study with a trend of 12% to less than 6%.  She was seen in the office in 03/2023 and continued to note dizziness that she described as if she was hearing something.  Dizziness at times was more pronounced with quick positional changes.  Blood pressure was largely in the 90s to low 100s mmHg systolic.  Entresto was decreased to 24/26 mg twice daily with continuation of Isordil, hydralazine, Toprol-XL, spironolactone, and furosemide.  She preferred to defer outpatient cardiac monitoring.  Echo on 04/28/2023 demonstrated an EF of 55 to 60%, no regional wall motion abnormalities, grade 1 diastolic dysfunction, normal RV systolic function with mildly enlarged ventricular cavity size, mild mitral regurgitation, mild to moderate aortic insufficiency, aortic valve sclerosis without evidence of stenosis, and a normal CVP.  She was last seen in the office in 01/2023 continuing to note dizziness that would occur with quick positional changes, though would also occur randomly.  She was concerned her blood pressure was running low and contributing to her symptoms.  Hydralazine, isosorbide, and Entresto were discontinued.  She was started on losartan 25 mg daily with continuation of Toprol-XL, spironolactone, and furosemide.  ***   Labs independently reviewed: 04/2023 - TC 197, TG 185, HDL 36, LDL 124, ALT normal 11/2022 - potassium 3.8, BUN 14, serum creatinine 1.11 12/2021 - TSH normal, magnesium 1.9, albumin 4.5, AST/ALT normal, Hgb 13.8, PLT 339  Past Medical History:  Diagnosis Date   Chronic combined systolic (congestive) and diastolic (congestive) heart failure (HCC)    Frequent PVCs    Hyperlipemia    Hypertension    Nonischemic cardiomyopathy (HCC)     Past  Surgical History:  Procedure Laterality Date   ABDOMINAL HYSTERECTOMY  10/13/2011   Procedure: HYSTERECTOMY ABDOMINAL;  Surgeon: Tilda Burrow, MD;  Location: [facility];  Service: Gynecology;  Laterality: N/A;   CARDIAC CATHETERIZATION     RIGHT/LEFT  HEART CATH AND CORONARY ANGIOGRAPHY N/A 11/19/2020   Procedure: RIGHT/LEFT HEART CATH AND CORONARY ANGIOGRAPHY;  Surgeon: Yvonne Kendall, MD;  Location: [facility];  Service: Cardiovascular;  Laterality: N/A;    Current Medications: No outpatient medications have been marked as taking for the 07/30/23 encounter (Appointment) with Sondra Barges, PA-C.    Allergies:   Penicillin g and Penicillins   Social History   Socioeconomic History   Marital status: Married    Spouse name: Not on file   Number of children: Not on file   Years of education: Not on file   Highest education level: Not on file  Occupational History   Not on file  Tobacco Use   Smoking status: Every Day    Current packs/day: 0.25    Average packs/day: 0.3 packs/day for 30.0 years (7.5 ttl pk-yrs)    Types: Cigarettes   Smokeless tobacco: Never   Tobacco comments:    2 packs a week.  Vaping Use   Vaping status: Former  Substance and Sexual Activity   Alcohol use: No   Drug use: Yes    Frequency: 7.0 times per week    Types: Marijuana   Sexual activity: Yes    Birth control/protection: None, Surgical  Other Topics Concern   Not on file  Social History Narrative   Not on file   Social Determinants of Health   Financial Resource Strain: Not on file  Food Insecurity: Not on file  Transportation Needs: Not on file  Physical Activity: Not on file  Stress: Not on file  Social Connections: Not on file     Family History:  The patient's family history includes Alzheimer's disease in her mother; Cancer in her brother, maternal aunt, and mother; Diabetes in her father and sister; Gout in her brother and father; Heart disease in her sister; Hypertension in her brother, father, and mother; Kidney disease in her father and maternal grandmother; Obesity in her sister; Stroke in her maternal grandmother and paternal grandmother. There is no history of Anesthesia problems.  ROS:    ROS   EKGs/Labs/Other Studies Reviewed:    Studies reviewed were summarized above. The additional studies were reviewed today:  2D echo 10/23/2020:  1. Left ventricular ejection fraction, by estimation, is 35 to 40%. Left  ventricular ejection fraction by 3D volume is 43 %. The left ventricle has  moderately decreased function. The left ventricle demonstrates global  hypokinesis. Left ventricular  diastolic parameters are consistent with Grade II diastolic dysfunction  (pseudonormalization). The average left ventricular global longitudinal  strain is -10.2 %. The global longitudinal strain is abnormal.   2. Right ventricular systolic function is mildly reduced. The right  ventricular size is mildly enlarged.   3. The mitral valve is normal in structure. Mild to moderate mitral valve  regurgitation.   4. The aortic valve was not well visualized. Aortic valve regurgitation  is mild to moderate. Mild aortic valve sclerosis is present, with no  evidence of aortic valve stenosis.   5. The inferior vena cava is dilated in size with <50% respiratory  variability, suggesting right atrial pressure of 15 mmHg. __________   [facility] 11/19/2020: Conclusions: No angiographically significant coronary artery disease consistent with nonischemic cardiomyopathy.  Severely elevated left heart, right heart, and pulmonary artery pressures. Severely reduced Fick cardiac output/index in the setting of markedly elevated systemic blood pressure.   Recommendations: Admit for diuresis and optimization of goal-directed medical therapy. Consider advanced heart failure consultation, ideally as an outpatient but potentially as transfer to [facility] if patient fails to respond to inotrope therapy and escalation of evidence-based heart failure agents. __________   Limited echo 02/05/2021: 1. Left ventricular ejection fraction, by estimation, is 45 to 50%. The  left ventricle has mildly decreased function. The left  ventricle has no  regional wall motion abnormalities.   2. Right ventricular systolic function is low normal. The right  ventricular size is normal.   3. The mitral valve is normal in structure. Trivial mitral valve  regurgitation.   4. The aortic valve is tricuspid. Aortic valve regurgitation is trivial. __________   [name] patch 05/2021: The patient was monitored for 2 days, 10 hours. The predominant rhythm was sinus with an average rate of 74 bpm (range 57-114 bpm). There were rare PACs and frequent PVCs (12.2% PVC burden). No sustained arrhythmia or prolonged pause was observed. Patient triggered/diary events correspond to sinus rhythm.   Predominantly sinus rhythm with frequent PVCs (12% burden). __________   Cardiac MRI 07/14/2021: IMPRESSION: 1. Normal LV size and thickness. low normal LV function, LVEF 53%. 2. Normal RV size and thickness, mildly reduced RV systolic function RVEF 42%. 3. There is no evidence for late gadolinium enhancement or scar. 4. Mild aortic regurgitation. 5. No findings to suggest etiology for PVC's. __________   [name] patch 07/2022:   The patient was monitored for 2 days, 11 hours.   The predominant rhythm was sinus with an average rate of 80 bpm (range 57-123 bpm in sinus).   There were rare PACs and frequent PVCs (PVC burden 5.6%).   Sustained arrhythmia or prolonged pause was observed.   Single patient triggered event corresponds to sinus rhythm with artifact.   Predominant sinus with rare PACs and frequent PVCs.  PVC burden has decreased significantly from prior monitor in 05/2021 (12% -> 6%). __________   2D echo 04/28/2023: 1. Left ventricular ejection fraction, by estimation, is 55 to 60%. The  left ventricle has normal function. The left ventricle has no regional  wall motion abnormalities. Left ventricular diastolic parameters are  consistent with Grade I diastolic  dysfunction (impaired relaxation).   2. Right ventricular systolic function  is normal. The right ventricular  size is mildly enlarged.   3. The mitral valve is normal in structure. Mild mitral valve  regurgitation.   4. The aortic valve is calcified. Aortic valve regurgitation is mild to  moderate. Aortic valve sclerosis/calcification is present, without any  evidence of aortic stenosis. Aortic valve mean gradient measures 8.0 mmHg.   5. The inferior vena cava is normal in size with greater than 50%  respiratory variability, suggesting right atrial pressure of 3 mmHg.   EKG:  EKG is ordered today.  The EKG ordered today demonstrates ***  Recent Labs: 09/10/2022: Magnesium 2.0 11/26/2022: BUN 14; Creatinine, Ser 1.11; Potassium 3.8; Sodium 137 04/08/2023: ALT 13  Recent Lipid Panel    Component Value Date/Time   CHOL 197 04/08/2023 0804   CHOL 231 (H) 10/30/2020 1439   TRIG 185 (H) 04/08/2023 0804   HDL 36 (L) 04/08/2023 0804   HDL 29 (L) 10/30/2020 1439   CHOLHDL 5.5 04/08/2023 0804   VLDL 37 04/08/2023 0804   LDLCALC 124 (H) 04/08/2023 0804  LDLCALC 153 (H) 10/30/2020 1439   LDLDIRECT 164 (H) 10/30/2020 1439    PHYSICAL EXAM:    VS:  LMP 09/21/2011 Comment: partial  BMI: There is no height or weight on file to calculate BMI.  Physical Exam  Wt Readings from Last 3 Encounters:  04/29/23 257 lb 12.8 oz (116.9 kg)  03/12/23 257 lb 9.6 oz (116.8 kg)  12/04/22 266 lb (120.7 kg)     ASSESSMENT & PLAN:   HFimpEF:   Dizziness:  PVCs:  Valvular heart disease: Echo from 04/28/2023 demonstrated mild mitral regurgitation with mild to moderate aortic insufficiency, which was previously noted by echo in 2021 and stable when compared to that study.   HTN: Blood pressure  HLD: LDL 124 in 04/2023.  Previously intolerant to atorvastatin.   {Are you ordering a CV Procedure (e.g. stress test, cath, DCCV, TEE, etc)?   Press F2        :[id number]}     Disposition: F/u with Dr. Okey Dupre or an APP in ***.   Medication Adjustments/Labs and Tests  Ordered: Current medicines are reviewed at length with the patient today.  Concerns regarding medicines are outlined above. Medication changes, Labs and Tests ordered today are summarized above and listed in the Patient Instructions accessible in Encounters.   Signed, Eula Listen, PA-C 07/26/2023 4:11 PM     [facility] - [place] [address] [place], [place] [address] [phone]

## 2023-07-30 ENCOUNTER — Encounter: Payer: Self-pay | Admitting: Physician Assistant

## 2023-07-30 ENCOUNTER — Ambulatory Visit: Payer: BC Managed Care – PPO | Attending: Physician Assistant | Admitting: Physician Assistant

## 2023-08-12 ENCOUNTER — Other Ambulatory Visit: Payer: Self-pay | Admitting: Physician Assistant

## 2023-08-12 DIAGNOSIS — I5022 Chronic systolic (congestive) heart failure: Secondary | ICD-10-CM

## 2023-08-12 NOTE — Telephone Encounter (Signed)
Please contact pt for future appointment. Pt overdue for  2 month f/u.

## 2023-08-16 NOTE — Telephone Encounter (Signed)
*  STAT* If patient is at the pharmacy, call can be transferred to refill team.   1. Which medications need to be refilled? (please list name of each medication and dose if known) Spironolactone   2. Would you like to learn more about the convenience, safety, & potential cost savings by using the [facility]?    3. Are you open to using the [facility] (Type [facility].    4. Which pharmacy/location (including street and city if local pharmacy) is medication to be sent to?[address] [place]   5. Do they need a 30 day or 90 day supply? 30 days and refills

## 2023-09-10 ENCOUNTER — Other Ambulatory Visit: Payer: Self-pay | Admitting: Physician Assistant

## 2023-10-06 ENCOUNTER — Other Ambulatory Visit: Payer: Self-pay | Admitting: Internal Medicine

## 2023-10-06 ENCOUNTER — Other Ambulatory Visit: Payer: Self-pay | Admitting: Physician Assistant

## 2023-10-06 DIAGNOSIS — I5022 Chronic systolic (congestive) heart failure: Secondary | ICD-10-CM

## 2023-11-02 DIAGNOSIS — F419 Anxiety disorder, unspecified: Secondary | ICD-10-CM | POA: Diagnosis not present

## 2023-11-02 DIAGNOSIS — D72829 Elevated white blood cell count, unspecified: Secondary | ICD-10-CM | POA: Diagnosis not present

## 2023-11-02 DIAGNOSIS — Z713 Dietary counseling and surveillance: Secondary | ICD-10-CM | POA: Diagnosis not present

## 2023-11-02 DIAGNOSIS — E782 Mixed hyperlipidemia: Secondary | ICD-10-CM | POA: Diagnosis not present

## 2023-11-22 ENCOUNTER — Other Ambulatory Visit: Payer: Self-pay | Admitting: Internal Medicine

## 2023-11-22 NOTE — Telephone Encounter (Signed)
 Appointment scheduled for 12/15/23

## 2023-11-22 NOTE — Telephone Encounter (Signed)
Hi,  Could you schedule this patient an overdue follow up visit? The patient was last seen by R. Dunn on 04-29-2023. Thank you so much.

## 2023-12-02 ENCOUNTER — Other Ambulatory Visit: Payer: Self-pay | Admitting: Internal Medicine

## 2023-12-02 DIAGNOSIS — I5022 Chronic systolic (congestive) heart failure: Secondary | ICD-10-CM

## 2023-12-14 NOTE — Progress Notes (Unsigned)
Cardiology Office Note    Date:  12/15/2023   ID:  Lauren Banks, DOB 12-Nov-1965, MRN [id number]  PCP:  Erasmo Downer, NP  Cardiologist:  Yvonne Kendall, MD  Electrophysiologist:  Lanier Prude, MD   Chief Complaint: Follow-up  History of Present Illness:   Lauren Banks is a 58 y.o. female with history of HFrEF secondary to NICM with subsequent normalization of LV systolic function by cardiac MRI in 07/2021, frequent PVCs, HTN, HLD, and tobacco use who presents for follow-up of her cardiomyopathy and PVCs.   She was evaluated by Dr. Okey Dupre as a new patient on 10/02/2020 at the request of her pulmonologist for evaluation of PVCs.  Echo on 10/23/2020 which showed an EF of 35 to 40%, global hypokinesis, grade 2 diastolic dysfunction, mildly reduced RV systolic function with a mildly enlarged RV cavity size, mild to moderate mitral regurgitation, mild to moderate aortic valve insufficiency with moderate aortic valve sclerosis with no evidence of aortic valve stenosis, and a right atrial pressure of 15 mmHg.  R/LHC in 11/2020 showed no angiographically significant CAD with severely elevated left heart, right heart, and pulmonary arterial pressures.  Severely reduced cardiac output/index in the context of markedly elevated systemic blood pressure.  She was admitted for diuresis and optimization of medical therapy.  Repeat limited echo in 01/2021 demonstrated an EF of 45 to 50%, no regional wall motion abnormalities, low normal RV systolic function with normal ventricular cavity size, trivial mitral regurgitation, and trivial aortic insufficiency.  Subsequent event monitoring showed a 12% PVC burden.  She was referred to EP with cardiac MRI showing normal biventricular systolic function and mild aortic regurgitation.  No etiology for PVCs was identified.  Repeat 3-day Zio patch showed a predominant rhythm of sinus with rare PACs and frequent PVCs, though PVC burden had decreased significantly from prior  study with a trend of 12% to less than 6%.  She was seen in the office in 03/2023 and remained without symptoms of angina or cardiac decompensation.  She continued to note dizziness that she described as if she was hearing something.  Dizziness at times was more pronounced with quick positional changes.  Blood pressure was largely in the 90s to low 100s mmHg systolic.  Entresto was decreased to 24/26 mg twice daily with continuation of Isordil, hydralazine, Toprol-XL, spironolactone, and furosemide.  She preferred to defer outpatient cardiac monitoring.  Echo on 04/28/2023 demonstrated an EF of 55 to 60%, no regional wall motion abnormalities, grade 1 diastolic dysfunction, normal RV systolic function with mildly enlarged ventricular cavity size, mild mitral regurgitation, mild to moderate aortic insufficiency, aortic valve sclerosis without evidence of stenosis, and a normal CVP.  She was last seen in the office in 04/2023 and remained without symptoms of angina or cardiac decompensation.  She continued to note dizziness that would occur with quick positional changes, though would also occur randomly.  She was concerned her blood pressure was running too low and contributing to her dizziness.  BP of 106/58 in the office.  Palpitation burden had improved.  In the setting of relative hypotension, hydralazine, Imdur, and Entresto were discontinued.  She was started on losartan 25 mg with continuation of Toprol-XL, spironolactone, and furosemide.  She comes in doing well from a cardiac perspective and is without symptoms of angina or cardiac decompensation.  Dizziness is significantly improved.  Blood pressure has been stable in the 120s over the 130s over 80s.  Feels much better with a blood pressure  running in this range.  No presyncope or syncope.  No significant lower extremity swelling or progressive orthopnea.  No falls or symptoms concerning for bleeding.  Weight stable.  Adherent and tolerating cardiac  medications.  Overall feels well and does not have any acute cardiac concerns at this time.   Labs independently reviewed: 04/2023 - TC 197, TG 185, HDL 36, LDL 124, ALT normal 11/2022 - potassium 3.8, BUN 14, serum creatinine 1.11 12/2021 - TSH normal, magnesium 1.9, albumin 4.5, AST/ALT normal, Hgb 13.8, PLT 339  Past Medical History:  Diagnosis Date   Chronic combined systolic (congestive) and diastolic (congestive) heart failure (HCC)    Frequent PVCs    Hyperlipemia    Hypertension    Nonischemic cardiomyopathy [facility])     Past Surgical History:  Procedure Laterality Date   ABDOMINAL HYSTERECTOMY  10/13/2011   Procedure: HYSTERECTOMY ABDOMINAL;  Surgeon: Tilda Burrow, MD;  Location: [facility];  Service: Gynecology;  Laterality: N/A;   CARDIAC CATHETERIZATION     RIGHT/LEFT HEART CATH AND CORONARY ANGIOGRAPHY N/A 11/19/2020   Procedure: RIGHT/LEFT HEART CATH AND CORONARY ANGIOGRAPHY;  Surgeon: Yvonne Kendall, MD;  Location: [facility] INVASIVE CV LAB;  Service: Cardiovascular;  Laterality: N/A;    Current Medications: Current Meds  Medication Sig   albuterol (VENTOLIN HFA) 108 (90 Base) MCG/ACT inhaler Inhale 2 puffs into the lungs every 4 (four) hours as needed for wheezing or shortness of breath.   buPROPion (WELLBUTRIN XL) 300 MG 24 hr tablet Take 300 mg by mouth daily.   escitalopram (LEXAPRO) 20 MG tablet Take 20 mg by mouth daily.   famotidine (PEPCID) 20 MG tablet TAKE 1 TABLET BY MOUTH ONCE DAILY.   fluticasone (FLONASE) 50 MCG/ACT nasal spray Place 1 spray into both nostrils daily as needed for allergies or rhinitis.   furosemide (LASIX) 40 MG tablet TAKE ONE TABLET BY MOUTH TWICE DAILY   ipratropium-albuterol (DUONEB) 0.5-2.5 (3) MG/3ML SOLN Inhale 3 mLs into the lungs every 4 (four) hours as needed (Asthma).   losartan (COZAAR) 25 MG tablet Take 1 tablet (25 mg total) by mouth daily.   magnesium oxide (MAG-OX) 400 (240 Mg) MG tablet TAKE ONE TABLET BY MOUTH EVERY DAY    metoprolol succinate (TOPROL-XL) 50 MG 24 hr tablet TAKE ONE TABLET BY MOUTH TWICE DAILY following a meal   pantoprazole (PROTONIX) 40 MG tablet Take 1 tablet (40 mg total) by mouth daily. Take 30-60 min before first meal of the day   potassium chloride SA (KLOR-CON M) 20 MEQ tablet Take 20 mEq by mouth daily.   rosuvastatin (CRESTOR) 5 MG tablet TAKE ONE TABLET BY MOUTH ONCE DAILY   spironolactone (ALDACTONE) 25 MG tablet TAKE ONE TABLET BY MOUTH EVERY DAY   venlafaxine XR (EFFEXOR-XR) 150 MG 24 hr capsule Take 150 mg by mouth daily.   Vitamin D, Ergocalciferol, (DRISDOL) 1.25 MG (50000 UNIT) CAPS capsule Take 50,000 Units by mouth daily.    Allergies:   Penicillin g and Penicillins   Social History   Socioeconomic History   Marital status: Married    Spouse name: Not on file   Number of children: Not on file   Years of education: Not on file   Highest education level: Not on file  Occupational History   Not on file  Tobacco Use   Smoking status: Every Day    Current packs/day: 0.25    Average packs/day: 0.3 packs/day for 30.0 years (7.5 ttl pk-yrs)    Types: Cigarettes  Smokeless tobacco: Never   Tobacco comments:    2 packs a week.  Vaping Use   Vaping status: Former  Substance and Sexual Activity   Alcohol use: No   Drug use: Yes    Frequency: 7.0 times per week    Types: Marijuana   Sexual activity: Yes    Birth control/protection: None, Surgical  Other Topics Concern   Not on file  Social History Narrative   Not on file   Social Drivers of Health   Financial Resource Strain: Not on file  Food Insecurity: Not on file  Transportation Needs: Not on file  Physical Activity: Not on file  Stress: Not on file  Social Connections: Not on file     Family History:  The patient's family history includes Alzheimer's disease in her mother; Cancer in her brother, maternal aunt, and mother; Diabetes in her father and sister; Gout in her brother and father; Heart disease  in her sister; Hypertension in her brother, father, and mother; Kidney disease in her father and maternal grandmother; Obesity in her sister; Stroke in her maternal grandmother and paternal grandmother. There is no history of Anesthesia problems.  ROS:   12-point review of systems is negative unless otherwise noted in the HPI.   EKGs/Labs/Other Studies Reviewed:    Studies reviewed were summarized above. The additional studies were reviewed today:  2D echo 10/23/2020:  1. Left ventricular ejection fraction, by estimation, is 35 to 40%. Left  ventricular ejection fraction by 3D volume is 43 %. The left ventricle has  moderately decreased function. The left ventricle demonstrates global  hypokinesis. Left ventricular  diastolic parameters are consistent with Grade II diastolic dysfunction  (pseudonormalization). The average left ventricular global longitudinal  strain is -10.2 %. The global longitudinal strain is abnormal.   2. Right ventricular systolic function is mildly reduced. The right  ventricular size is mildly enlarged.   3. The mitral valve is normal in structure. Mild to moderate mitral valve  regurgitation.   4. The aortic valve was not well visualized. Aortic valve regurgitation  is mild to moderate. Mild aortic valve sclerosis is present, with no  evidence of aortic valve stenosis.   5. The inferior vena cava is dilated in size with <50% respiratory  variability, suggesting right atrial pressure of 15 mmHg. __________   [facility] 11/19/2020: Conclusions: No angiographically significant coronary artery disease consistent with nonischemic cardiomyopathy. Severely elevated left heart, right heart, and pulmonary artery pressures. Severely reduced Fick cardiac output/index in the setting of markedly elevated systemic blood pressure.   Recommendations: Admit for diuresis and optimization of goal-directed medical therapy. Consider advanced heart failure consultation, ideally as  an outpatient but potentially as transfer to [facility] if patient fails to respond to inotrope therapy and escalation of evidence-based heart failure agents. __________   Limited echo 02/05/2021: 1. Left ventricular ejection fraction, by estimation, is 45 to 50%. The  left ventricle has mildly decreased function. The left ventricle has no  regional wall motion abnormalities.   2. Right ventricular systolic function is low normal. The right  ventricular size is normal.   3. The mitral valve is normal in structure. Trivial mitral valve  regurgitation.   4. The aortic valve is tricuspid. Aortic valve regurgitation is trivial. __________   [name] patch 05/2021: The patient was monitored for 2 days, 10 hours. The predominant rhythm was sinus with an average rate of 74 bpm (range 57-114 bpm). There were rare PACs and frequent PVCs (12.2%  PVC burden). No sustained arrhythmia or prolonged pause was observed. Patient triggered/diary events correspond to sinus rhythm.   Predominantly sinus rhythm with frequent PVCs (12% burden). __________   Cardiac MRI 07/14/2021: IMPRESSION: 1. Normal LV size and thickness. low normal LV function, LVEF 53%. 2. Normal RV size and thickness, mildly reduced RV systolic function RVEF 42%. 3. There is no evidence for late gadolinium enhancement or scar. 4. Mild aortic regurgitation. 5. No findings to suggest etiology for PVC's. __________   [name] patch 07/2022:   The patient was monitored for 2 days, 11 hours.   The predominant rhythm was sinus with an average rate of 80 bpm (range 57-123 bpm in sinus).   There were rare PACs and frequent PVCs (PVC burden 5.6%).   Sustained arrhythmia or prolonged pause was observed.   Single patient triggered event corresponds to sinus rhythm with artifact.   Predominant sinus with rare PACs and frequent PVCs.  PVC burden has decreased significantly from prior monitor in 05/2021 (12% -> 6%). __________   2D echo 04/28/2023: 1.  Left ventricular ejection fraction, by estimation, is 55 to 60%. The  left ventricle has normal function. The left ventricle has no regional  wall motion abnormalities. Left ventricular diastolic parameters are  consistent with Grade I diastolic  dysfunction (impaired relaxation).   2. Right ventricular systolic function is normal. The right ventricular  size is mildly enlarged.   3. The mitral valve is normal in structure. Mild mitral valve  regurgitation.   4. The aortic valve is calcified. Aortic valve regurgitation is mild to  moderate. Aortic valve sclerosis/calcification is present, without any  evidence of aortic stenosis. Aortic valve mean gradient measures 8.0 mmHg.   5. The inferior vena cava is normal in size with greater than 50%  respiratory variability, suggesting right atrial pressure of 3 mmHg.    EKG:  EKG is ordered today.  The EKG ordered today demonstrates NSR, 67 bpm, occasional isolated PVCs, RBBB, consistent with prior tracing  Recent Labs: 04/08/2023: ALT 13  Recent Lipid Panel    Component Value Date/Time   CHOL 197 04/08/2023 0804   CHOL 231 (H) 10/30/2020 1439   TRIG 185 (H) 04/08/2023 0804   HDL 36 (L) 04/08/2023 0804   HDL 29 (L) 10/30/2020 1439   CHOLHDL 5.5 04/08/2023 0804   VLDL 37 04/08/2023 0804   LDLCALC 124 (H) 04/08/2023 0804   LDLCALC 153 (H) 10/30/2020 1439   LDLDIRECT 164 (H) 10/30/2020 1439    PHYSICAL EXAM:    VS:  BP 130/86 (BP Location: Left Arm, Patient Position: Sitting, Cuff Size: Normal)   Pulse 67   Ht 5\' 10"  (1.778 m)   Wt 258 lb (117 kg)   LMP 09/21/2011 Comment: partial  SpO2 96%   BMI 37.02 kg/m   BMI: Body mass index is 37.02 kg/m.  Physical Exam Vitals reviewed.  Constitutional:      Appearance: She is well-developed.  HENT:     Head: Normocephalic and atraumatic.  Eyes:     General:        Right eye: No discharge.        Left eye: No discharge.  Cardiovascular:     Rate and Rhythm: Normal rate and regular  rhythm.     Heart sounds: Normal heart sounds, S1 normal and S2 normal. Heart sounds not distant. No midsystolic click and no opening snap. No murmur heard.    No friction rub.  Pulmonary:     Effort:  Pulmonary effort is normal. No respiratory distress.     Breath sounds: Normal breath sounds. No decreased breath sounds, wheezing, rhonchi or rales.  Chest:     Chest wall: No tenderness.  Musculoskeletal:     Cervical back: Normal range of motion.     Right lower leg: No edema.     Left lower leg: No edema.  Skin:    General: Skin is warm and dry.     Nails: There is no clubbing.  Neurological:     Mental Status: She is alert and oriented to person, place, and time.  Psychiatric:        Speech: Speech normal.        Behavior: Behavior normal.        Thought Content: Thought content normal.        Judgment: Judgment normal.     Wt Readings from Last 3 Encounters:  12/15/23 258 lb (117 kg)  04/29/23 257 lb 12.8 oz (116.9 kg)  03/12/23 257 lb 9.6 oz (116.8 kg)     ASSESSMENT & PLAN:   HFimpEF: Euvolemic and well compensated with NYHA class I symptoms.  Relative hypotension with associated dizziness has led to the de-escalation of GDMT.  In this setting, we will continue her on the losartan 25 mg, Toprol-XL 50 mg twice daily, furosemide 40 mg twice daily and spironolactone 25 mg daily.  She is also on KCl 20 mEq daily.  Defer rechallenge of Entresto or initiation of SGLT2 inhibitor.  Check renal function and electrolytes.  PVCs: Overall burden improved from 12% to 5%.  She remains on Toprol-XL 50 mg twice daily.  Check CMP and magnesium.  Valvular heart disease: Echo from 04/28/2023 demonstrated mild mitral regurgitation with mild to moderate aortic insufficiency, which was previously noted by echo in 2021 and stable when compared to that study.  Periodic monitoring.  HTN: Blood pressure is well-controlled in the office today.  As previously noted relative hypotension with associated  dizziness leading to the de-escalation of pharmacotherapy.  Continue losartan 25 mg, spironolactone 25 mg, and Toprol-XL 50 mg twice daily.  HLD: LDL 124 in 04/2023.  Previously intolerant to atorvastatin.  Tolerating rosuvastatin 5 mg.  Check lipid panel and LFT.  Dizziness: Improved with de-escalation of pharmacotherapy and increase in blood pressure.  Medication management/preventative medicine: We will also obtain CBC, A1c and vitamin D as ordered by her PCP in an effort to minimize venipuncture on the patient.    Disposition: F/u with Dr. Okey Dupre or an APP in 6 months, and EP as directed.   Medication Adjustments/Labs and Tests Ordered: Current medicines are reviewed at length with the patient today.  Concerns regarding medicines are outlined above. Medication changes, Labs and Tests ordered today are summarized above and listed in the Patient Instructions accessible in Encounters.   SignedEula Listen, PA-C 12/15/2023 11:32 AM     [facility] [place] [address] [place], [place] [address] [phone]

## 2023-12-15 ENCOUNTER — Ambulatory Visit: Payer: BC Managed Care – PPO | Attending: Physician Assistant | Admitting: Physician Assistant

## 2023-12-15 ENCOUNTER — Encounter: Payer: Self-pay | Admitting: Physician Assistant

## 2023-12-15 VITALS — BP 130/86 | HR 67 | Ht 70.0 in | Wt 258.0 lb

## 2023-12-15 DIAGNOSIS — I428 Other cardiomyopathies: Secondary | ICD-10-CM

## 2023-12-15 DIAGNOSIS — I509 Heart failure, unspecified: Secondary | ICD-10-CM

## 2023-12-15 DIAGNOSIS — I10 Essential (primary) hypertension: Secondary | ICD-10-CM

## 2023-12-15 DIAGNOSIS — Z79899 Other long term (current) drug therapy: Secondary | ICD-10-CM | POA: Diagnosis not present

## 2023-12-15 DIAGNOSIS — I38 Endocarditis, valve unspecified: Secondary | ICD-10-CM

## 2023-12-15 DIAGNOSIS — I493 Ventricular premature depolarization: Secondary | ICD-10-CM | POA: Diagnosis not present

## 2023-12-15 DIAGNOSIS — R42 Dizziness and giddiness: Secondary | ICD-10-CM

## 2023-12-15 DIAGNOSIS — I5032 Chronic diastolic (congestive) heart failure: Secondary | ICD-10-CM

## 2023-12-15 DIAGNOSIS — E782 Mixed hyperlipidemia: Secondary | ICD-10-CM

## 2023-12-15 NOTE — Patient Instructions (Signed)
Medication Instructions:  Your Physician recommend you continue on your current medication as directed.    *If you need a refill on your cardiac medications before your next appointment, please call your pharmacy*   Lab Work: Your provider would like for you to have following labs drawn today Lipid panel, CBC, CMeT, A1C, and Vitamin D level.   If you have labs (blood work) drawn today and your tests are completely normal, you will receive your results only by: MyChart Message (if you have MyChart) OR A paper copy in the mail If you have any lab test that is abnormal or we need to change your treatment, we will call you to review the results.   Follow-Up: At [facility], you and your health needs are our priority.  As part of our continuing mission to provide you with exceptional heart care, we have created designated Provider Care Teams.  These Care Teams include your primary Cardiologist (physician) and Advanced Practice Providers (APPs -  Physician Assistants and Nurse Practitioners) who all work together to provide you with the care you need, when you need it.  We recommend signing up for the patient portal called "MyChart".  Sign up information is provided on this After Visit Summary.  MyChart is used to connect with patients for Virtual Visits (Telemedicine).  Patients are able to view lab/test results, encounter notes, upcoming appointments, etc.  Non-urgent messages can be sent to your provider as well.   To learn more about what you can do with MyChart, go to [email or web address].    Your next appointment:   6 month(s)  Provider:   You may see Yvonne Kendall, MD or one of the following Advanced Practice Providers on your designated Care Team:   Nicolasa Ducking, NP Eula Listen, PA-C Cadence Fransico Michael, PA-C Charlsie Quest, NP Carlos Levering, NP

## 2023-12-23 ENCOUNTER — Other Ambulatory Visit: Payer: Self-pay

## 2023-12-23 DIAGNOSIS — Z79899 Other long term (current) drug therapy: Secondary | ICD-10-CM

## 2023-12-23 MED ORDER — ROSUVASTATIN CALCIUM 10 MG PO TABS: 10.0000 mg | ORAL_TABLET | Freq: Every day | ORAL | 6 refills | Status: DC

## 2023-12-27 ENCOUNTER — Other Ambulatory Visit: Payer: Self-pay | Admitting: Internal Medicine

## 2023-12-27 DIAGNOSIS — I5022 Chronic systolic (congestive) heart failure: Secondary | ICD-10-CM

## 2023-12-27 NOTE — Telephone Encounter (Signed)
 Contacted Pharmacy to pend Magnesium and not fill until provider response. Pharmacist will not fill medication and is aware that we well send in a new Rx if ok to refill.

## 2023-12-27 NOTE — Telephone Encounter (Signed)
 Please advise if ok to refill Magnesium.

## 2023-12-29 ENCOUNTER — Other Ambulatory Visit: Payer: Self-pay

## 2023-12-29 DIAGNOSIS — Z79899 Other long term (current) drug therapy: Secondary | ICD-10-CM | POA: Diagnosis not present

## 2023-12-29 MED ORDER — MAGNESIUM OXIDE -MG SUPPLEMENT 400 (240 MG) MG PO TABS: 1.0000 | ORAL_TABLET | Freq: Every day | ORAL | 2 refills | Status: DC

## 2023-12-30 LAB — LIPID PANEL
Chol/HDL Ratio: 5.7 {ratio} — ABNORMAL HIGH (ref 0.0–4.4)
Chol/HDL Ratio: 5.3 {ratio} — ABNORMAL HIGH (ref 0.0–4.4)
Cholesterol, Total: 198 mg/dL (ref 100–199)
Cholesterol, Total: 207 mg/dL — ABNORMAL HIGH (ref 100–199)
HDL: 35 mg/dL — ABNORMAL LOW (ref >39)
HDL: 39 mg/dL — ABNORMAL LOW (ref >39)
LDL Chol Calc (NIH): 138 mg/dL — ABNORMAL HIGH (ref 0–99)
LDL Chol Calc (NIH): 138 mg/dL — ABNORMAL HIGH (ref 0–99)
Triglycerides: 139 mg/dL (ref 0–149)
Triglycerides: 168 mg/dL — ABNORMAL HIGH (ref 0–149)
VLDL Cholesterol Cal: 25 mg/dL (ref 5–40)
VLDL Cholesterol Cal: 30 mg/dL (ref 5–40)

## 2023-12-30 LAB — COMPREHENSIVE METABOLIC PANEL
ALT: 11 [IU]/L (ref 0–32)
AST: 18 [IU]/L (ref 0–40)
Albumin: 4.1 g/dL (ref 3.8–4.9)
Alkaline Phosphatase: 120 [IU]/L (ref 44–121)
BUN/Creatinine Ratio: 10 (ref 9–23)
BUN: 11 mg/dL (ref 6–24)
Bilirubin Total: 0.2 mg/dL (ref 0.0–1.2)
CO2: 23 mmol/L (ref 20–29)
Calcium: 9.6 mg/dL (ref 8.7–10.2)
Chloride: 100 mmol/L (ref 96–106)
Creatinine, Ser: 1.08 mg/dL — ABNORMAL HIGH (ref 0.57–1.00)
Globulin, Total: 2.3 g/dL (ref 1.5–4.5)
Glucose: 99 mg/dL (ref 70–99)
Potassium: 4.2 mmol/L (ref 3.5–5.2)
Sodium: 140 mmol/L (ref 134–144)
Total Protein: 6.4 g/dL (ref 6.0–8.5)
eGFR: 60 mL/min/{1.73_m2} (ref >59)

## 2023-12-30 LAB — HEMOGLOBIN A1C
Est. average glucose Bld gHb Est-mCnc: 131 mg/dL
Hgb A1c MFr Bld: 6.2 % — ABNORMAL HIGH (ref 4.8–5.6)

## 2023-12-30 LAB — HEPATIC FUNCTION PANEL
ALT: 18 [IU]/L (ref 0–32)
AST: 21 [IU]/L (ref 0–40)
Albumin: 4.3 g/dL (ref 3.8–4.9)
Alkaline Phosphatase: 121 [IU]/L (ref 44–121)
Bilirubin Total: 0.4 mg/dL (ref 0.0–1.2)
Bilirubin, Direct: 0.14 mg/dL (ref 0.00–0.40)
Total Protein: 6.7 g/dL (ref 6.0–8.5)

## 2023-12-30 LAB — CBC
Hematocrit: 42.2 % (ref 34.0–46.6)
Hemoglobin: 13.5 g/dL (ref 11.1–15.9)
MCH: 27.1 pg (ref 26.6–33.0)
MCHC: 32 g/dL (ref 31.5–35.7)
MCV: 85 fL (ref 79–97)
Platelets: 346 10*3/uL (ref 150–450)
RBC: 4.99 x10E6/uL (ref 3.77–5.28)
RDW: 13.6 % (ref 11.7–15.4)
WBC: 10.4 10*3/uL (ref 3.4–10.8)

## 2023-12-30 LAB — VITAMIN D 1,25 DIHYDROXY
Vitamin D 1, 25 (OH)2 Total: 97 pg/mL — ABNORMAL HIGH
Vitamin D2 1, 25 (OH)2: 79 pg/mL
Vitamin D3 1, 25 (OH)2: 18 pg/mL

## 2024-01-24 ENCOUNTER — Other Ambulatory Visit: Payer: Self-pay | Admitting: Internal Medicine

## 2024-01-24 NOTE — Telephone Encounter (Signed)
 Received refill request for atorvastatin 5 mg. However, medication was increased to 10 mg on 12/23/23. Refill requested denied.

## 2024-02-01 ENCOUNTER — Other Ambulatory Visit: Payer: Self-pay | Admitting: Internal Medicine

## 2024-03-17 ENCOUNTER — Other Ambulatory Visit: Payer: Self-pay | Admitting: Physician Assistant

## 2024-04-13 ENCOUNTER — Other Ambulatory Visit: Payer: Self-pay | Admitting: Internal Medicine

## 2024-04-13 DIAGNOSIS — I5022 Chronic systolic (congestive) heart failure: Secondary | ICD-10-CM

## 2024-05-11 ENCOUNTER — Other Ambulatory Visit: Payer: Self-pay | Admitting: Physician Assistant

## 2024-06-12 ENCOUNTER — Other Ambulatory Visit: Payer: Self-pay | Admitting: Physician Assistant

## 2024-07-11 NOTE — Progress Notes (Unsigned)
 Cardiology Office Note    Date:  07/11/2024   ID:  Lauren Banks, DOB [birth year], MRN [id number]  PCP:  Johnson Morna FALCON, NP  Cardiologist:  Lonni Hanson, MD  Electrophysiologist:  OLE ONEIDA HOLTS, MD   Chief Complaint: Follow-up  History of Present Illness:   Lauren Banks is a 58 y.o. 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   She was evaluated by Dr. Hanson as a new patient on 10/02/2020 at the request of her pulmonologist for evaluation of PVCs.  Echo on 10/23/2020 which showed an EF of 35 to 40%, global hypokinesis, grade 2 diastolic dysfunction, mildly reduced RV systolic function with a mildly enlarged RV cavity size, mild to moderate mitral regurgitation, mild to moderate aortic valve insufficiency with moderate aortic valve sclerosis with no evidence of aortic valve stenosis, and a right atrial pressure of 15 mmHg.  R/LHC in 11/2020 showed no angiographically significant CAD with severely elevated left heart, right heart, and pulmonary arterial pressures.  Severely reduced cardiac output/index in the context of markedly elevated systemic blood pressure.  She was admitted for diuresis and optimization of medical therapy.  Repeat limited echo in 01/2021 demonstrated an EF of 45 to 50%, no regional wall motion abnormalities, low normal RV systolic function with normal ventricular cavity size, trivial mitral regurgitation, and trivial aortic insufficiency.  Subsequent event monitoring showed a 12% PVC burden.  She was referred to EP with cardiac MRI showing normal biventricular systolic function and mild aortic regurgitation.  No etiology for PVCs was identified.  Repeat 3-day Zio patch showed a predominant rhythm of sinus with rare PACs and frequent PVCs, though PVC burden had decreased significantly from  prior study with a trend of 12% to less than 6%.  She was seen in the office in 03/2023 and remained without symptoms of angina or cardiac decompensation.  She continued to note dizziness that she described as if she was hearing something.  Dizziness at times was more pronounced with quick positional changes.  Blood pressure was largely in the 90s to low 100s mmHg systolic.  Entresto  was decreased to 24/26 mg twice daily with continuation of Isordil , hydralazine , Toprol -XL, spironolactone , and furosemide .  She preferred to defer outpatient cardiac monitoring.  Echo on 04/28/2023 demonstrated an EF of 55 to 60%, no regional wall motion abnormalities, grade 1 diastolic dysfunction, normal RV systolic function with mildly enlarged ventricular cavity size, mild mitral regurgitation, mild to moderate aortic insufficiency, aortic valve sclerosis without evidence of stenosis, and a normal CVP.  She was seen in the office in 04/2023 and continued to note dizziness that would occur with quick positional changes, though would also occur randomly.  Palpitation burden had improved.  In the setting of relative hypotension, hydralazine , Imdur, and Entresto  were discontinued.  She was started on losartan  25 mg with continuation of Toprol -XL, spironolactone , and furosemide .  She was last seen in the office in 12/2023 noting significant improvement in dizziness with blood pressures stable in the 120s to 130s mmHg systolic.  ***   Labs independently reviewed: 12/2023 - albumin 4.3, AST/ALT normal, TC 207, TG 168, HDL 39, LDL 138, A1c 6.2, Hgb 13.5, PLT 346, BUN 11, serum creatinine 1.08, potassium 4.2 12/2021 - TSH normal  Past Medical History:  Diagnosis Date   Chronic combined systolic (congestive) and diastolic (congestive)  heart failure (HCC)    Frequent PVCs    Hyperlipemia    Hypertension    Nonischemic cardiomyopathy [facility])     Past Surgical History:  Procedure Laterality Date   ABDOMINAL HYSTERECTOMY  10/13/2011    Procedure: HYSTERECTOMY ABDOMINAL;  Surgeon: Norleen LULLA Server, MD;  Location: [facility];  Service: Gynecology;  Laterality: N/A;   CARDIAC CATHETERIZATION     RIGHT/LEFT HEART CATH AND CORONARY ANGIOGRAPHY N/A 11/19/2020   Procedure: RIGHT/LEFT HEART CATH AND CORONARY ANGIOGRAPHY;  Surgeon: Mady Bruckner, MD;  Location: [facility] INVASIVE CV LAB;  Service: Cardiovascular;  Laterality: N/A;    Current Medications: No outpatient medications have been marked as taking for the 07/13/24 encounter (Appointment) with [name], PA-C.    Allergies:   Penicillin g and Penicillins   Social History   Socioeconomic History   Marital status: Married    Spouse name: Not on file   Number of children: Not on file   Years of education: Not on file   Highest education level: Not on file  Occupational History   Not on file  Tobacco Use   Smoking status: Every Day    Current packs/day: 0.25    Average packs/day: 0.3 packs/day for 30.0 years (7.5 ttl pk-yrs)    Types: Cigarettes   Smokeless tobacco: Never   Tobacco comments:    2 packs a week.  Vaping Use   Vaping status: Former  Substance and Sexual Activity   Alcohol use: No   Drug use: Yes    Frequency: 7.0 times per week    Types: Marijuana   Sexual activity: Yes    Birth control/protection: None, Surgical  Other Topics Concern   Not on file  Social History Narrative   Not on file   Social Drivers of Health   Financial Resource Strain: Not on file  Food Insecurity: Not on file  Transportation Needs: Not on file  Physical Activity: Not on file  Stress: Not on file  Social Connections: Not on file     Family History:  The patient's family history includes Alzheimer's disease in her mother; Cancer in her brother, maternal aunt, and mother; Diabetes in her father and sister; Gout in her brother and father; Heart disease in her sister; Hypertension in her brother, father, and mother; Kidney disease in her father and maternal  grandmother; Obesity in her sister; Stroke in her maternal grandmother and paternal grandmother. There is no history of Anesthesia problems.  ROS:   12-point review of systems is negative unless otherwise noted in the HPI.   EKGs/Labs/Other Studies Reviewed:    Studies reviewed were summarized above. The additional studies were reviewed today:  2D echo 10/23/2020:  1. Left ventricular ejection fraction, by estimation, is 35 to 40%. Left  ventricular ejection fraction by 3D volume is 43 %. The left ventricle has  moderately decreased function. The left ventricle demonstrates global  hypokinesis. Left ventricular  diastolic parameters are consistent with Grade II diastolic dysfunction  (pseudonormalization). The average left ventricular global longitudinal  strain is -10.2 %. The global longitudinal strain is abnormal.   2. Right ventricular systolic function is mildly reduced. The right  ventricular size is mildly enlarged.   3. The mitral valve is normal in structure. Mild to moderate mitral valve  regurgitation.   4. The aortic valve was not well visualized. Aortic valve regurgitation  is mild to moderate. Mild aortic valve sclerosis is present, with no  evidence of aortic valve stenosis.  5. The inferior vena cava is dilated in size with <50% respiratory  variability, suggesting right atrial pressure of 15 mmHg. __________   [facility] 11/19/2020: Conclusions: No angiographically significant coronary artery disease consistent with nonischemic cardiomyopathy. Severely elevated left heart, right heart, and pulmonary artery pressures. Severely reduced Fick cardiac output/index in the setting of markedly elevated systemic blood pressure.   Recommendations: Admit for diuresis and optimization of goal-directed medical therapy. Consider advanced heart failure consultation, ideally as an outpatient but potentially as transfer to [place] if patient fails to respond to inotrope therapy and  escalation of evidence-based heart failure agents. __________   Limited echo 02/05/2021: 1. Left ventricular ejection fraction, by estimation, is 45 to 50%. The  left ventricle has mildly decreased function. The left ventricle has no  regional wall motion abnormalities.   2. Right ventricular systolic function is low normal. The right  ventricular size is normal.   3. The mitral valve is normal in structure. Trivial mitral valve  regurgitation.   4. The aortic valve is tricuspid. Aortic valve regurgitation is trivial. __________   Zio patch 05/2021: The patient was monitored for 2 days, 10 hours. The predominant rhythm was sinus with an average rate of 74 bpm (range 57-114 bpm). There were rare PACs and frequent PVCs (12.2% PVC burden). No sustained arrhythmia or prolonged pause was observed. Patient triggered/diary events correspond to sinus rhythm.   Predominantly sinus rhythm with frequent PVCs (12% burden). __________   Cardiac MRI 07/14/2021: IMPRESSION: 1. Normal LV size and thickness. low normal LV function, LVEF 53%. 2. Normal RV size and thickness, mildly reduced RV systolic function RVEF 42%. 3. There is no evidence for late gadolinium enhancement or scar. 4. Mild aortic regurgitation. 5. No findings to suggest etiology for PVC's. __________   Zio patch 07/2022:   The patient was monitored for 2 days, 11 hours.   The predominant rhythm was sinus with an average rate of 80 bpm (range 57-123 bpm in sinus).   There were rare PACs and frequent PVCs (PVC burden 5.6%).   Sustained arrhythmia or prolonged pause was observed.   Single patient triggered event corresponds to sinus rhythm with artifact.   Predominant sinus with rare PACs and frequent PVCs.  PVC burden has decreased significantly from prior monitor in 05/2021 (12% -> 6%). __________   2D echo 04/28/2023: 1. Left ventricular ejection fraction, by estimation, is 55 to 60%. The  left ventricle has normal function.  The left ventricle has no regional  wall motion abnormalities. Left ventricular diastolic parameters are  consistent with Grade I diastolic  dysfunction (impaired relaxation).   2. Right ventricular systolic function is normal. The right ventricular  size is mildly enlarged.   3. The mitral valve is normal in structure. Mild mitral valve  regurgitation.   4. The aortic valve is calcified. Aortic valve regurgitation is mild to  moderate. Aortic valve sclerosis/calcification is present, without any  evidence of aortic stenosis. Aortic valve mean gradient measures 8.0 mmHg.   5. The inferior vena cava is normal in size with greater than 50%  respiratory variability, suggesting right atrial pressure of 3 mmHg.    EKG:  EKG is ordered today.  The EKG ordered today demonstrates ***  Recent Labs: 12/15/2023: BUN 11; Creatinine, Ser 1.08; Hemoglobin 13.5; Platelets 346; Potassium 4.2; Sodium 140 12/29/2023: ALT 18  Recent Lipid Panel    Component Value Date/Time   CHOL 207 (H) 12/29/2023 1026   TRIG 168 (H) 12/29/2023 1026  HDL 39 (L) 12/29/2023 1026   CHOLHDL 5.3 (H) 12/29/2023 1026   CHOLHDL 5.5 04/08/2023 0804   VLDL 37 04/08/2023 0804   LDLCALC 138 (H) 12/29/2023 1026   LDLDIRECT 164 (H) 10/30/2020 1439    PHYSICAL EXAM:    VS:  LMP 09/21/2011 Comment: partial  BMI: There is no height or weight on file to calculate BMI.  Physical Exam  Wt Readings from Last 3 Encounters:  12/15/23 258 lb (117 kg)  04/29/23 257 lb 12.8 oz (116.9 kg)  03/12/23 257 lb 9.6 oz (116.8 kg)     ASSESSMENT & PLAN:   HFimpEF:  PVCs:  Valvular heart disease:  HTN: Blood pressure  HLD: LDL 138 in 12/2023.  Previously intolerant to atorvastatin .  Dizziness:   {Are you ordering a CV Procedure (e.g. stress test, cath, DCCV, TEE, etc)?   Press F2        :[id number]}     Disposition: F/u with Dr. Mady or an APP in ***.   Medication Adjustments/Labs and Tests Ordered: Current medicines are  reviewed at length with the patient today.  Concerns regarding medicines are outlined above. Medication changes, Labs and Tests ordered today are summarized above and listed in the Patient Instructions accessible in Encounters.   Signed, Bernardino Bring, PA-C 07/11/2024 10:36 AM     [facility] [place] [address] [place], [place] [address] [phone]

## 2024-07-13 ENCOUNTER — Encounter: Payer: Self-pay | Admitting: Physician Assistant

## 2024-07-13 ENCOUNTER — Ambulatory Visit: Attending: Physician Assistant | Admitting: Physician Assistant

## 2024-07-13 VITALS — BP 139/80 | HR 69 | Ht 70.0 in | Wt 251.0 lb

## 2024-07-13 DIAGNOSIS — I10 Essential (primary) hypertension: Secondary | ICD-10-CM

## 2024-07-13 DIAGNOSIS — I38 Endocarditis, valve unspecified: Secondary | ICD-10-CM

## 2024-07-13 DIAGNOSIS — I493 Ventricular premature depolarization: Secondary | ICD-10-CM | POA: Diagnosis not present

## 2024-07-13 DIAGNOSIS — I509 Heart failure, unspecified: Secondary | ICD-10-CM

## 2024-07-13 DIAGNOSIS — I5032 Chronic diastolic (congestive) heart failure: Secondary | ICD-10-CM | POA: Diagnosis not present

## 2024-07-13 DIAGNOSIS — I428 Other cardiomyopathies: Secondary | ICD-10-CM

## 2024-07-13 DIAGNOSIS — R42 Dizziness and giddiness: Secondary | ICD-10-CM

## 2024-07-13 DIAGNOSIS — E782 Mixed hyperlipidemia: Secondary | ICD-10-CM

## 2024-07-13 MED ORDER — ROSUVASTATIN CALCIUM 10 MG PO TABS: 10.0000 mg | ORAL_TABLET | Freq: Every day | ORAL | 11 refills | Status: AC

## 2024-07-13 MED ORDER — FUROSEMIDE 40 MG PO TABS: 40.0000 mg | ORAL_TABLET | Freq: Two times a day (BID) | ORAL | 3 refills | Status: AC

## 2024-07-13 MED ORDER — LOSARTAN POTASSIUM 25 MG PO TABS: 25.0000 mg | ORAL_TABLET | Freq: Every morning | ORAL | 3 refills | Status: AC

## 2024-07-13 MED ORDER — POTASSIUM CHLORIDE CRYS ER 20 MEQ PO TBCR: 20.0000 meq | EXTENDED_RELEASE_TABLET | Freq: Every day | ORAL | 3 refills | Status: AC

## 2024-07-13 MED ORDER — METOPROLOL SUCCINATE ER 50 MG PO TB24: 50.0000 mg | ORAL_TABLET | Freq: Two times a day (BID) | ORAL | 3 refills | Status: AC

## 2024-07-13 MED ORDER — SPIRONOLACTONE 25 MG PO TABS: 25.0000 mg | ORAL_TABLET | Freq: Every day | ORAL | 11 refills | Status: AC

## 2024-07-13 NOTE — Patient Instructions (Signed)
 Medication Instructions:  Your physician recommends that you continue on your current medications as directed. Please refer to the Current Medication list given to you today.   *If you need a refill on your cardiac medications before your next appointment, please call your pharmacy*  Lab Work: None ordered at this time   Follow-Up: At [facility], you and your health needs are our priority.  As part of our continuing mission to provide you with exceptional heart care, our providers are all part of one team.  This team includes your primary Cardiologist (physician) and Advanced Practice Providers or APPs (Physician Assistants and Nurse Practitioners) who all work together to provide you with the care you need, when you need it.  Your next appointment:   6 month(s)  Provider:   You may see Bernardino Bring, PA-C

## 2024-08-30 ENCOUNTER — Other Ambulatory Visit: Payer: Self-pay | Admitting: Physician Assistant

## 2024-08-30 DIAGNOSIS — I5022 Chronic systolic (congestive) heart failure: Secondary | ICD-10-CM

## 2024-09-04 DIAGNOSIS — D72829 Elevated white blood cell count, unspecified: Secondary | ICD-10-CM | POA: Diagnosis not present

## 2024-09-04 DIAGNOSIS — E782 Mixed hyperlipidemia: Secondary | ICD-10-CM | POA: Diagnosis not present

## 2024-09-04 DIAGNOSIS — F419 Anxiety disorder, unspecified: Secondary | ICD-10-CM | POA: Diagnosis not present

## 2024-09-04 DIAGNOSIS — Z713 Dietary counseling and surveillance: Secondary | ICD-10-CM | POA: Diagnosis not present
